# Patient Record
Sex: Male | Born: 1945 | Race: White | Hispanic: No | Marital: Married | Smoking: Never smoker
Health system: Southern US, Community
[De-identification: ages and names within clinical notes are randomized; demographics above are authoritative.]

## PROBLEM LIST (undated history)

## (undated) DIAGNOSIS — I700 Atherosclerosis of aorta: Secondary | ICD-10-CM

## (undated) DIAGNOSIS — N281 Cyst of kidney, acquired: Secondary | ICD-10-CM

## (undated) DIAGNOSIS — Z87442 Personal history of urinary calculi: Secondary | ICD-10-CM

## (undated) DIAGNOSIS — K219 Gastro-esophageal reflux disease without esophagitis: Secondary | ICD-10-CM

## (undated) DIAGNOSIS — M1990 Unspecified osteoarthritis, unspecified site: Secondary | ICD-10-CM

## (undated) DIAGNOSIS — Z9861 Coronary angioplasty status: Principal | ICD-10-CM

## (undated) DIAGNOSIS — K5790 Diverticulosis of intestine, part unspecified, without perforation or abscess without bleeding: Secondary | ICD-10-CM

## (undated) DIAGNOSIS — Z955 Presence of coronary angioplasty implant and graft: Secondary | ICD-10-CM

## (undated) DIAGNOSIS — E7800 Pure hypercholesterolemia, unspecified: Secondary | ICD-10-CM

## (undated) DIAGNOSIS — R911 Solitary pulmonary nodule: Secondary | ICD-10-CM

## (undated) DIAGNOSIS — I10 Essential (primary) hypertension: Secondary | ICD-10-CM

## (undated) DIAGNOSIS — I2510 Atherosclerotic heart disease of native coronary artery without angina pectoris: Secondary | ICD-10-CM

## (undated) DIAGNOSIS — Z8679 Personal history of other diseases of the circulatory system: Secondary | ICD-10-CM

## (undated) DIAGNOSIS — Z974 Presence of external hearing-aid: Secondary | ICD-10-CM

## (undated) DIAGNOSIS — C61 Malignant neoplasm of prostate: Secondary | ICD-10-CM

## (undated) DIAGNOSIS — I8390 Asymptomatic varicose veins of unspecified lower extremity: Secondary | ICD-10-CM

## (undated) DIAGNOSIS — Z85828 Personal history of other malignant neoplasm of skin: Secondary | ICD-10-CM

## (undated) DIAGNOSIS — Z85820 Personal history of malignant melanoma of skin: Secondary | ICD-10-CM

## (undated) DIAGNOSIS — Z973 Presence of spectacles and contact lenses: Secondary | ICD-10-CM

## (undated) DIAGNOSIS — Z9889 Other specified postprocedural states: Secondary | ICD-10-CM

## (undated) HISTORY — PX: LUMBAR SPINE SURGERY: SHX701

## (undated) HISTORY — DX: Gastro-esophageal reflux disease without esophagitis: K21.9

## (undated) HISTORY — DX: Coronary angioplasty status: Z98.61

## (undated) HISTORY — PX: CERVICAL FUSION: SHX112

## (undated) HISTORY — PX: TRIGGER FINGER RELEASE: SHX641

## (undated) HISTORY — DX: Diverticulosis of intestine, part unspecified, without perforation or abscess without bleeding: K57.90

## (undated) HISTORY — DX: Pure hypercholesterolemia, unspecified: E78.00

## (undated) HISTORY — PX: BRACHIAL NERVE REPAIR: SHX1260

## (undated) HISTORY — DX: Asymptomatic varicose veins of unspecified lower extremity: I83.90

## (undated) HISTORY — PX: POSTERIOR LUMBAR FUSION: SHX6036

## (undated) HISTORY — DX: Essential (primary) hypertension: I10

## (undated) HISTORY — PX: CORONARY ANGIOPLASTY: SHX604

## (undated) HISTORY — PX: BUNIONECTOMY: SHX129

## (undated) HISTORY — DX: Atherosclerotic heart disease of native coronary artery without angina pectoris: I25.10

## (undated) HISTORY — DX: Solitary pulmonary nodule: R91.1

---

## 1993-04-15 HISTORY — PX: OTHER SURGICAL HISTORY: SHX169

## 1997-09-14 ENCOUNTER — Ambulatory Visit (HOSPITAL_COMMUNITY): Admission: RE | Admit: 1997-09-14 | Discharge: 1997-09-14 | Payer: Self-pay | Admitting: Orthopedic Surgery

## 1997-12-22 ENCOUNTER — Encounter: Payer: Self-pay | Admitting: Orthopedic Surgery

## 1997-12-22 ENCOUNTER — Ambulatory Visit (HOSPITAL_COMMUNITY): Admission: RE | Admit: 1997-12-22 | Discharge: 1997-12-24 | Payer: Self-pay | Admitting: Orthopedic Surgery

## 1998-01-06 ENCOUNTER — Encounter: Payer: Self-pay | Admitting: Orthopedic Surgery

## 1998-01-06 ENCOUNTER — Ambulatory Visit (HOSPITAL_COMMUNITY): Admission: RE | Admit: 1998-01-06 | Discharge: 1998-01-06 | Payer: Self-pay | Admitting: Orthopedic Surgery

## 1998-01-19 ENCOUNTER — Ambulatory Visit (HOSPITAL_COMMUNITY): Admission: RE | Admit: 1998-01-19 | Discharge: 1998-01-19 | Payer: Self-pay | Admitting: Orthopedic Surgery

## 1999-08-13 ENCOUNTER — Ambulatory Visit (HOSPITAL_COMMUNITY): Admission: RE | Admit: 1999-08-13 | Discharge: 1999-08-14 | Payer: Self-pay | Admitting: *Deleted

## 1999-08-13 ENCOUNTER — Encounter: Payer: Self-pay | Admitting: *Deleted

## 1999-08-14 ENCOUNTER — Encounter: Payer: Self-pay | Admitting: *Deleted

## 1999-10-26 ENCOUNTER — Ambulatory Visit (HOSPITAL_COMMUNITY): Admission: RE | Admit: 1999-10-26 | Discharge: 1999-10-26 | Payer: Self-pay | Admitting: *Deleted

## 1999-10-26 ENCOUNTER — Encounter: Payer: Self-pay | Admitting: *Deleted

## 1999-12-13 ENCOUNTER — Ambulatory Visit (HOSPITAL_COMMUNITY): Admission: RE | Admit: 1999-12-13 | Discharge: 1999-12-13 | Payer: Self-pay | Admitting: *Deleted

## 1999-12-13 ENCOUNTER — Encounter: Payer: Self-pay | Admitting: *Deleted

## 2001-06-26 ENCOUNTER — Encounter: Payer: Self-pay | Admitting: Geriatric Medicine

## 2001-06-26 ENCOUNTER — Encounter: Admission: RE | Admit: 2001-06-26 | Discharge: 2001-06-26 | Payer: Self-pay | Admitting: Geriatric Medicine

## 2002-04-15 DIAGNOSIS — Z9861 Coronary angioplasty status: Secondary | ICD-10-CM

## 2002-04-15 DIAGNOSIS — I2510 Atherosclerotic heart disease of native coronary artery without angina pectoris: Secondary | ICD-10-CM

## 2002-04-15 HISTORY — DX: Atherosclerotic heart disease of native coronary artery without angina pectoris: I25.10

## 2002-04-15 HISTORY — DX: Atherosclerotic heart disease of native coronary artery without angina pectoris: Z98.61

## 2002-05-17 ENCOUNTER — Ambulatory Visit (HOSPITAL_COMMUNITY): Admission: RE | Admit: 2002-05-17 | Discharge: 2002-05-17 | Payer: Self-pay | Admitting: Gastroenterology

## 2002-06-28 ENCOUNTER — Ambulatory Visit (HOSPITAL_COMMUNITY): Admission: RE | Admit: 2002-06-28 | Discharge: 2002-06-29 | Payer: Self-pay | Admitting: Cardiology

## 2002-06-28 DIAGNOSIS — Z955 Presence of coronary angioplasty implant and graft: Secondary | ICD-10-CM

## 2002-06-28 HISTORY — DX: Presence of coronary angioplasty implant and graft: Z95.5

## 2003-04-14 ENCOUNTER — Ambulatory Visit (HOSPITAL_COMMUNITY): Admission: RE | Admit: 2003-04-14 | Discharge: 2003-04-14 | Payer: Self-pay | Admitting: Geriatric Medicine

## 2003-09-30 ENCOUNTER — Encounter: Admission: RE | Admit: 2003-09-30 | Discharge: 2003-09-30 | Payer: Self-pay | Admitting: Neurosurgery

## 2003-10-28 ENCOUNTER — Inpatient Hospital Stay (HOSPITAL_COMMUNITY): Admission: RE | Admit: 2003-10-28 | Discharge: 2003-11-01 | Payer: Self-pay | Admitting: Neurosurgery

## 2005-01-28 ENCOUNTER — Ambulatory Visit (HOSPITAL_COMMUNITY): Admission: RE | Admit: 2005-01-28 | Discharge: 2005-01-28 | Payer: Self-pay | Admitting: Neurosurgery

## 2005-08-14 ENCOUNTER — Encounter: Payer: Self-pay | Admitting: Orthopedic Surgery

## 2005-09-26 ENCOUNTER — Ambulatory Visit (HOSPITAL_BASED_OUTPATIENT_CLINIC_OR_DEPARTMENT_OTHER): Admission: RE | Admit: 2005-09-26 | Discharge: 2005-09-26 | Payer: Self-pay | Admitting: Orthopedic Surgery

## 2008-04-13 ENCOUNTER — Ambulatory Visit (HOSPITAL_COMMUNITY): Admission: RE | Admit: 2008-04-13 | Discharge: 2008-04-13 | Payer: Self-pay | Admitting: Gastroenterology

## 2008-04-13 ENCOUNTER — Encounter (INDEPENDENT_AMBULATORY_CARE_PROVIDER_SITE_OTHER): Payer: Self-pay | Admitting: Gastroenterology

## 2009-04-06 ENCOUNTER — Encounter: Admission: RE | Admit: 2009-04-06 | Discharge: 2009-04-06 | Payer: Self-pay | Admitting: Neurosurgery

## 2009-07-21 ENCOUNTER — Inpatient Hospital Stay (HOSPITAL_COMMUNITY): Admission: RE | Admit: 2009-07-21 | Discharge: 2009-07-23 | Payer: Self-pay | Admitting: Neurosurgery

## 2010-05-06 ENCOUNTER — Encounter: Payer: Self-pay | Admitting: Geriatric Medicine

## 2010-07-04 LAB — COMPREHENSIVE METABOLIC PANEL
ALT: 23 U/L (ref 0–53)
AST: 26 U/L (ref 0–37)
Albumin: 4.2 g/dL (ref 3.5–5.2)
Alkaline Phosphatase: 55 U/L (ref 39–117)
BUN: 14 mg/dL (ref 6–23)
CO2: 27 mEq/L (ref 19–32)
Calcium: 9.7 mg/dL (ref 8.4–10.5)
Chloride: 108 mEq/L (ref 96–112)
Creatinine, Ser: 0.89 mg/dL (ref 0.4–1.5)
GFR calc Af Amer: >60 mL/min (ref >60)
GFR calc non Af Amer: >60 mL/min (ref >60)
Glucose, Bld: 122 mg/dL — ABNORMAL HIGH (ref 70–99)
Potassium: 4.7 mEq/L (ref 3.5–5.1)
Sodium: 140 mEq/L (ref 135–145)
Total Bilirubin: 0.8 mg/dL (ref 0.3–1.2)
Total Protein: 6.3 g/dL (ref 6.0–8.3)

## 2010-07-04 LAB — DIFFERENTIAL
Basophils Absolute: 0 10*3/uL (ref 0.0–0.1)
Basophils Relative: 1 % (ref 0–1)
Eosinophils Absolute: 0.1 10*3/uL (ref 0.0–0.7)
Eosinophils Relative: 2 % (ref 0–5)
Lymphocytes Relative: 32 % (ref 12–46)
Lymphs Abs: 1.8 10*3/uL (ref 0.7–4.0)
Monocytes Absolute: 0.4 10*3/uL (ref 0.1–1.0)
Monocytes Relative: 8 % (ref 3–12)
Neutro Abs: 3.3 10*3/uL (ref 1.7–7.7)
Neutrophils Relative %: 58 % (ref 43–77)

## 2010-07-04 LAB — CBC
HCT: 48 % (ref 39.0–52.0)
Hemoglobin: 16.3 g/dL (ref 13.0–17.0)
MCHC: 34 g/dL (ref 30.0–36.0)
MCV: 89.9 fL (ref 78.0–100.0)
Platelets: 150 10*3/uL (ref 150–400)
RBC: 5.34 MIL/uL (ref 4.22–5.81)
RDW: 12.9 % (ref 11.5–15.5)
WBC: 5.7 10*3/uL (ref 4.0–10.5)

## 2010-07-04 LAB — URINALYSIS, ROUTINE W REFLEX MICROSCOPIC
Bilirubin Urine: NEGATIVE
Glucose, UA: NEGATIVE mg/dL
Hgb urine dipstick: NEGATIVE
Ketones, ur: NEGATIVE mg/dL
Nitrite: NEGATIVE
Protein, ur: NEGATIVE mg/dL
Specific Gravity, Urine: 1.012 (ref 1.005–1.030)
Urobilinogen, UA: 1 mg/dL (ref 0.0–1.0)
pH: 7.5 (ref 5.0–8.0)

## 2010-07-04 LAB — PROTIME-INR
INR: 1.09 (ref 0.00–1.49)
Prothrombin Time: 14 seconds (ref 11.6–15.2)

## 2010-07-04 LAB — SURGICAL PCR SCREEN
MRSA, PCR: NEGATIVE
Staphylococcus aureus: POSITIVE — AB

## 2010-07-04 LAB — APTT: aPTT: 32 seconds (ref 24–37)

## 2010-07-23 ENCOUNTER — Other Ambulatory Visit: Payer: Self-pay | Admitting: Neurosurgery

## 2010-07-23 DIAGNOSIS — M5481 Occipital neuralgia: Secondary | ICD-10-CM

## 2010-07-27 ENCOUNTER — Other Ambulatory Visit: Payer: Self-pay | Admitting: Neurosurgery

## 2010-07-27 DIAGNOSIS — M531 Cervicobrachial syndrome: Secondary | ICD-10-CM

## 2010-07-30 ENCOUNTER — Other Ambulatory Visit: Payer: Self-pay

## 2010-07-30 ENCOUNTER — Other Ambulatory Visit: Payer: Self-pay | Admitting: Neurosurgery

## 2010-07-30 ENCOUNTER — Ambulatory Visit
Admission: RE | Admit: 2010-07-30 | Discharge: 2010-07-30 | Disposition: A | Discharge status: Home / Self Care | Payer: 59 | Source: Ambulatory Visit | Attending: Neurosurgery | Admitting: Neurosurgery

## 2010-07-30 DIAGNOSIS — M5481 Occipital neuralgia: Secondary | ICD-10-CM

## 2010-07-30 DIAGNOSIS — M531 Cervicobrachial syndrome: Secondary | ICD-10-CM

## 2010-08-15 ENCOUNTER — Other Ambulatory Visit: Payer: Self-pay | Admitting: Dermatology

## 2010-08-28 NOTE — Op Note (Signed)
NAME:  Ian [name], Ian [name]                  ACCOUNT NO.:  [id number]   MEDICAL RECORD NO.:  [id number]          PATIENT TYPE:  AMB   LOCATION:  ENDO                         FACILITY:  [facility]   PHYSICIAN:  Danise Edge, M.D.   DATE OF BIRTH:  [birth year]   DATE OF PROCEDURE:  04/13/2008  DATE OF DISCHARGE:                               OPERATIVE REPORT   PROCEDURE INDICATIONS:  [name] we is a 65 year old male born,  06/19/1945.  [name] describes undergoing an esophageal dilation  in the past.  In 2004, his esophagogastroduodenoscopy was normal.  He  has chronic gastroesophageal reflux, which is well controlled on his  proton pump inhibitor therapy.  He has developed intermittent solid food  dysphagia with chest pain.   MEDICATION ALLERGIES:  None.  He has gastrointestinal side effects when  he takes PENICILLIN, CODEINE, MORPHINE, and DEMEROL.   CHRONIC MEDICATIONS:  Aspirin, Zetia, Lescol, Protonix, lisinopril,  Viagra, multivitamin, Glucosamine, Coenzyme Q10, Omega-3 fatty acid.   PAST MEDICAL AND SURGICAL HISTORY:  1. Coronary artery disease.  2. Coronary artery stent placement.  3. Hypertension.  4. Gastroesophageal reflux.  5. Headaches.  6. Dyslipidemia.  7. Cervical spine surgery.  8. Vasectomy.   ENDOSCOPIST:  Danise Edge, MD   PREMEDICATION:  Fentanyl 75 mcg and Versed 8 mg.   PROCEDURE:  After obtaining informed consent, [name] was placed in the  left lateral decubitus position on the fluoroscopy table.  I  administered into intravenous fentanyl and intravenous Versed to achieve  conscious sedation for the procedure.  The patient's blood pressure,  oxygen saturation, and cardiac rhythm were monitored throughout the  procedure and documented in the medical record.   The Pentax gastroscope was passed through the posterior hypopharynx into  the proximal esophagus without difficulty.  The hypopharynx, larynx, and  vocal cords appear normal.   Esophagoscopy:  The proximal mid and lower segments of the esophageal  mucosa appear completely normal.  There is no endoscopic evidence for  the presence of erosive esophagitis, Barrett esophagus, esophageal  stricture formation, or esophageal obstruction.  The squamocolumnar  junction and esophagogastric junction are noted at 40 cm from the  incisor teeth.   Gastroscopy:  Retroflex view of the gastric cardia and fundus reveals  several 5-mm sized gastric polyps, which were biopsies.  The gastric  body, antrum, and pylorus appeared normal.   Duodenoscopy:  The duodenal bulb and descending duodenum appeared  normal.   Savary esophageal dilation:  The Savary dilator wire was passed through  the gastroscope and the tip of the guidewire advanced through the distal  gastric antrum.  Fluoroscopy was not required for esophageal dilation.  The 15-mm Savary dilator passed without resistance.  Repeat  esophagogastrostomy revealed an intact esophageal mucosa without  indication of esophageal stricture dilation.  The gastric mucosa  appeared normal without signs of trauma due to the wire.   Esophageal biopsies:  Biopsies were taken along the length of the  esophagus to look for eosinophilic esophagitis.   ASSESSMENT:  Normal esophagogastroduodenoscopy except for the presence  of several small benign appearing polyps in the gastric cardia and  fundus, which were biopsied.  Esophageal biopsies are pending to rule  out eosinophilic esophagitis.  No endoscopic evidence for the presence  of Barrett esophagus, erosive esophagitis, or esophageal stricture  formation as a result of his chronic gastroesophageal reflux.           ______________________________  Danise Edge, M.D.     MJ/MEDQ  D:  04/13/2008  T:  04/13/2008  Job:  [id number]   cc:   Hal T. Stoneking, M.D.

## 2010-08-31 NOTE — Discharge Summary (Signed)
[place]. [facility]  Patient:    Ian [name], Ian [name]                         MRN: [id number] Adm. Date:  [id number] Disc. Date: [id number] Attending:  Evonnie Dawes CC:         Dr. Renae Fickle             Dr. Luiz Blare             Dr. Meryl Crutch                           Discharge Summary  ADMISSION DIAGNOSES:  1. Status post remote right C2 ganglionectomy which was successful in 1993     for control of headache.  2. Status post remote L4-5 right laminectomy and L3-4 laminectomy for     herniated disk.  3. Recent MRI confirmed L4-5 herniated nucleus pulposus midline and to the     right with alternating right and left leg pain and spinal stenosis, not     responsive to conservative treatment.  OPERATIONS/PROCEDURES:  1. Bilateral L4-5 microsurgical decompression with complicated lysis of     epidural adhesions on the right.  2. Harvesting of allograft for fusion.  3. Right-sided L3 through L5 intertransverse body fusion.  The surgery was performed by Dr. Gasper Sells, assisted by Dr. Luiz Blare.  Anesthesia was general, controlled.  Estimated blood loss was 800 cc.  Operative report number [place] [id number].  COMPLICATIONS: None.  HISTORY OF PRESENT ILLNESS: This patient is a 65 year old male who I am familiar with because I did a right C2 ganglionectomy on him in 1993 that was successful, and his headaches were very much better and do not present a clinical problem right now.  What did happen with him is that I saw him again in late March of 2001 and basically he re-ruptured the L4-5 level that Dr. Renae Fickle had operated on in 1998.  Subsequent to that he had ruptured the L3-4 disk as well and had quite a bit of residual numbness and tingling in the right side, weakness in the quadriceps, and also some dorsiflexion weakness on the right side.  This was identical to what he had preoperatively.  His major pain was in his left leg preoperatively, however.  HOSPITAL COURSE: The L4-5  level was approached in an x-ray controlled fashion and attempt was made to get into the disk space at L4-5 from the right side. There were massive adhesions and the dura on that side was inadequate; however, the arachnoid had not been perforated and there was no CSF leak.  It was elected to remove the disk as radically as possible from the left side and then reevaluate the right side, and this was carried out.  The L5 nerve root on the right side was then palpated with a blunt nerve hook and found to be completely free.  Postoperatively I kept him flat, having done a Surgicel and Tisseal closure of the lacerated scar tissue.  The morning following surgery he had no headache and the incision was flat. He was ambulated and was ambulating normally in the hallway.  His left leg pain was completely gone and his right leg pain was significantly better.  The weakness that he had in his leg preoperatively was essentially unchanged and probably related to remote surgery anyway.  His incision looked clear.  His vital signs were stable.  He  was tolerating oral feeds, and his bladder was functioning normally, so he was discharged home.  DISCHARGE MEDICATIONS:  1. He had Vicodin at home to take on a p.r.n. basis for pain and he was told     he could only have that for three weeks.  2. Septra DS 1 p.o. b.i.d.  DISCHARGE CONDITION: Markedly improved.  DISPOSITION: Home.  FOLLOW-UP: To follow up in eight days in my office. DD:  08/14/99 TD:  08/16/99 Job: [id number]

## 2010-08-31 NOTE — Op Note (Signed)
[place]. [facility]  Patient:    Ian [name], Ian [name]                         MRN: [id number] Proc. Date: 08/13/99 Adm. Date:  [id number] Disc. Date: [id number] Attending:  Evonnie Dawes Dictator:   Donnal Debar. Gasper Sells, M.D. CC:         Jearld Adjutant, M.D.                           Operative Report  PREOPERATIVE DIAGNOSIS:  Herniated nucleus pulposus with spinal stenosis, L4-5, center line and eccentric to the right, with a history of previous laminectomies at L3-4 and L4-5.  OPERATIONS: 1. Bilateral L4-5 microsurgical diskectomies. 2. Harvesting posterior elements for autograft. 3. Right L3 through L5 intertransverse body fusion.  SURGEON:  Dr. Gasper Sells.  ASSISTANT:  Dr. Luiz Blare.  COMPLICATIONS:  None.  ESTIMATED BLOOD LOSS:  Around 800 cc.  HISTORY OF PRESENT ILLNESS:  This is a 65 year old gentleman I have been following in the office, having done a previous successful C2 ganglionectomy on him in 1993.  He had a previous L4-5 laminectomy done by Dr. Renae Fickle, and then an L3-4 laminectomy.  He had recurrence of his pain with initially left leg pain, and since then it has alternated from left to right.  A recent MRI showed a large herniated nucleus pulposus, center line to the right at L4-5. This is in addition to congenitally short pedicles and spinal stenosis.  Based on this MRI and his clinical findings and indolent course it was decided that he would require surgery.  DESCRIPTION OF PROCEDURE:  An attempt is made to get into the L4-5 disc from the right side, but there was too much scar tissue.  A careful lysis is done of the L5 nerve root, but it basically was covered only with arachnoid.  There was no CSF leak.  Attention was then directed to the left side, where radical L4-5 diskectomy was performed extending as far as we could over to the right side and subligamentously superiorly.  The disc was irrigated with Betadine solution at that point,  and the L5 nerve root on the left was completely freed.  Attention was then redirected to the right side.  Palpation with a blunt nerve hook was then carried out.  The L5 nerve root on that side was somewhat stuck down, but was free dorsally.  ______ was then used to glue Surgicel over the arachnoid covered L5 nerve root on the right.  There was no dura really to close.  It was all scar tissue.  The posterior elements had been harvested for bone graft and cleaned up. These were then packed over the roughened up lateral aspect of L3, L4, and L5 and the transverse process of L3, L4, and L5 on the right.  DBX bone gel was then placed over top of that, and Surgicel was placed on top of that.  A 2 minute phentanyl epidural was performed with 100 mcg of phentanyl, and then the wound was irrigated with Betadine solution and hydrogen peroxide. Self-retaining retractors removed, and then the dorsolumbar fascia was closed with 0 Vicryl in a running fashion.  The subcutaneous closure was carried out with 2-0 Vicryl inverted interrupted.  Skin staples were used in the skin. Betadine ointment and dry dressing was applied.  The patient tolerated the procedure well. DD:  08/13/99 TD:  08/15/99  Job: [id number]

## 2010-08-31 NOTE — Discharge Summary (Signed)
NAME:  Ian [name], Ian [name]                            ACCOUNT NO.:  [id number]   MEDICAL RECORD NO.:  [id number]                   PATIENT TYPE:  INP   LOCATION:  3011                                 FACILITY:  [facility]   PHYSICIAN:  Payton Doughty, M.D.                   DATE OF BIRTH:  [birth year]   DATE OF ADMISSION:  10/28/2003  DATE OF DISCHARGE:  11/01/2003                                 DISCHARGE SUMMARY   ADMISSION DIAGNOSIS:  Spondylosis, L3-4, L4-5, and S1.   DISCHARGE DIAGNOSIS:  Spondylosis, L3-4, L4-5, and S1.   OPERATIVE PROCEDURES:  1. L3-4, L4-5, and L5-S1 laminectomy and diskectomy.  2. Posterior interbody fusion.  3. Ray threaded fusion cages.  4. Posterolateral arthrodesis.  5. Segmental fixation from L3 to S1.   COMPLICATIONS:  None.   DISCHARGE STATUS:  Alive and well.   HISTORY OF PRESENT ILLNESS:  A 65 year old gentleman whose history and  physical is recounted on the chart.  He has basically had several back  operations which have not helped him any.  He has intractable back pain and  pain down his left leg.  He is admitted for fusion.   PAST MEDICAL HISTORY:  Remarkable for hypertension.   ALLERGIES:  He gets sick with PENICILLIN, OXYCODONE, DEMEROL, and MORPHINE.   MEDICATIONS:  Lisinopril, hydrochlorothiazide, ergotamine, Nexium,  Pravachol, Zetia, and doxycycline.   PHYSICAL EXAMINATION:  General exam is intact.  Neurologic exam demonstrated  a right L5 radiculopathy.   HOSPITAL COURSE:  He was admitted after ascertainment of normal laboratory  values and underwent the fusion as above.  He had quite a lot of compression  of the right L5 root by bone graft that had apparently been in the canal.  Postoperatively he has done extremely well.  He has had a little bit of a  pressure sore on his chin, which is healing.  He was up eating and voiding  by the second postoperative day.  The PCA was stopped the third  postoperative day.   DISPOSITION:  Today he is  being discharged home in the care of his family.   DISCHARGE MEDICATIONS:  He takes Vicoprofen for pain.  He is also going to  go home on ciprofloxacin.   FOLLOWUP:  His followup will be in the [facility] office  in a week for suture removal.                                                Payton Doughty, M.D.    MWR/MEDQ  D:  11/01/2003  T:  11/01/2003  Job:  [id number]

## 2010-08-31 NOTE — Op Note (Signed)
NAME:  SHAWNTA, Ian [name]                            ACCOUNT NO.:  [id number]   MEDICAL RECORD NO.:  [id number]                   PATIENT TYPE:  INP   LOCATION:  3011                                 FACILITY:  [facility]   PHYSICIAN:  Payton Doughty, M.D.                   DATE OF BIRTH:  08-24-1945   DATE OF PROCEDURE:  10/28/2003  DATE OF DISCHARGE:                                 OPERATIVE REPORT   PREOPERATIVE DIAGNOSIS:  Spondylosis at L3-4, L4-5 and L5-S1.   POSTOPERATIVE DIAGNOSIS:  Spondylosis at L3-4, L4-5 and L5-S1.   OPERATIVE PROCEDURE:  L3-4, L4-5 and L5-S1 laminectomies, diskectomies and  posterior lumbar interbody fusion with Ray threaded fusion cages, segmental  pedicle screws from L3 to S1 and posterolateral arthrodesis from L3 to S1.   SURGEON:  Payton Doughty, M.D.   NURSE ASSISTANT:  [place].   DOCTOR ASSISTANT:  Danae Orleans. Venetia Maxon, M.D.   SERVICE:  Neurosurgery.   ANESTHESIA:  General endotracheal.   PREPARATION:  Prepped with sterile Betadine prep and scrubbed with alcohol  wipe.   COMPLICATIONS:  None.   BODY OF TEXT:  [age]-year-old gentleman with severe lumbar spondylosis.  He has had [name] prior diskectomy at L4-5.  He has had an attempt at fusion by  another neurosurgeon at L4-5 and he has positive diskography at 3-4 and 4-5.  He was taken to the operating room and smoothly anesthetized, intubated and  placed prone on the operating table.  Following shave, he was prepped and  draped in the usual sterile fashion.  Skin was infiltrated with 1% lidocaine  with 1:400,000 epinephrine.  Skin was incised from mid-L2 to mid-S1 and the  lamina and transverse processes of L3, L4, L5, S1 and the sacral ala were  exposed bilaterally in the subperiosteal plane.  Intraoperative x-ray  confirmed correctness of the level.  Having confirmed correctness of the  level, the pars interarticularis, remaining lamina and inferior facets of  L3, L4 and L5 and the superior facets of L3,  L4, L5 and S1 were removed  bilaterally.  At 3-4, there was severe spondylosis with compression of the  nerve roots as they traversed the lateral recesses and the neural foramina.  At 4-5 on the right, there was bone graft from the prior fusion that was  inside the canal and growing, significantly compressing the 5 root as it  traversed this area.  It was not possible to get out the disk in this area  because of dense bony overgrowth and the position of the nerve root,  however, the nerve root was completely decompressed.  On the left side,  approach to the nerve root to the disk was feasible and accomplished without  difficulty.  At 5-1, there was spina bifida on the right side.  Following  complete decompression and diskectomy at 3-4 and at 5-1 and on the left  side  at 4-5, Ray threaded fusion cages were placed, 14 x 21 at L5-S1, [name] 12  x 21 cage placed on the left at 4-5 and two 14 x 21 mm at 3-4.  Pedicle  screws were then placed, the cages were packed with bone graft harvested  from the facet joints, the pedicle screws were placed at 3, 4, 5, and 1 and  attached to the rods and locking caps were applied.  Intertransverse bone  was placed over the transverse processes and sacral ala after decortication.  Intraoperative x-ray showed good placement of Ray cages, pedicle screws and  rods.  DVX and morcelized allograft were mixed over the transverse  processes.  The fascia was reapproximated with 0 Vicryl in interrupted  fashion, subcutaneous tissue was reapproximated with 0 Vicryl in interrupted  fashion, subcuticular tissue was reapproximated with 3-0 Vicryl in  interrupted fashion and skin was closed with 3-0 nylon in [name] running-locked  fashion.  Betadine and Telfa dressing were applied and made occlusive with  OpSite and the patient returned to the recovery room in good condition.                                               Payton Doughty, M.D.    MWR/MEDQ  D:  10/28/2003  T:   10/29/2003  Job:  [id number]

## 2010-08-31 NOTE — Op Note (Signed)
NAME:  Ian [name], Ian [name]                  ACCOUNT NO.:  [id number]   MEDICAL RECORD NO.:  [id number]          PATIENT TYPE:  AMB   LOCATION:  DSC                          FACILITY:  [facility]   PHYSICIAN:  Katy Fitch. Sypher, M.D. 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   OPERATION:  1.  Release of right ring finger A1 pulley.  2.  Injection of left ring finger A1 pulley and flexor sheath with Depo-      Medrol and lidocaine 2%.   OPERATING SURGEON:  Josephine Igo, M.D.   ASSISTANT:  Molly Maduro Dasnoit PA-C   ANESTHESIA:  2% lidocaine metacarpal head level block of right ring finger  supplemented by IV sedation, supervising anesthesiologist is Dr. Gelene Mink.   INDICATIONS:  Keyshawn Hellwig is brought to the operating room and placed in  supine position on the operating table.   Following light sedation, the right and left arms were prepped, the right  arm with Betadine soap solution, sterilely draped and the left palm with  alcohol.   The left ring finger flexor sheath was injected with a 1 mL mixture of 0.25  mL of Depo-Medrol 80 mg per mL and 0.75 mL of 2% plain lidocaine.   The finger was placed in flexion, the needle inserted at a 30 degrees angle  through the A1 pulley and the flexor tendons brought distally by finger  extension.  Half of the injection was deposited within the flexor sheath  distending the sheath and half on the exterior surface of the A1 pulley.  The needle was withdrawn and a Band-Aid applied.  There no apparent  complications.  Attention then directed to the right arm which  was  exsanguinated by elevation and compression.  A pneumatic tourniquet was  applied to the proximal brachium at 240 mmHg.  The procedure commenced with  short oblique incision just distal to the distal palmar crease overlying the  thickened A1 pulley.  The subcutaneous tissues were carefully divided  releasing the pre tendinous fibers of the palmar fascia.   The A1 pulley was isolated and noted to be thickened.  The pulley was  released along its radial border with scalpel and scissors.  The tendons  delivered and found be otherwise normal except for fibrotic tenosynovium  proximally.  The tenosynovium was split longitudinally.   Full passive range of motion of fingers recovered.   No apparent complications.   The wound was irrigated and repaired with mattress suture of 5-0 nylon.   A compressive dressings applied with Xeroflo, sterile gauze and Ace wrap.  There no apparent complications.      Katy Fitch Sypher, M.D.  Electronically Signed     RVS/MEDQ  D:  09/26/2005  T:  09/26/2005  Job:  [id number]   cc:   Hal T. Stoneking, M.D.  Fax: [phone]

## 2010-08-31 NOTE — Op Note (Signed)
NAME:  Ian [name], Ian [name]                            ACCOUNT NO.:  [id number]   MEDICAL RECORD NO.:  [id number]                   PATIENT TYPE:  AMB   LOCATION:  ENDO                                 FACILITY[facility]  [facility]   PHYSICIAN:  Danise Edge, M.D.                DATE OF BIRTH:  [birth year]   DATE OF PROCEDURE:  05/17/2002  DATE OF DISCHARGE:                                 OPERATIVE REPORT   PROCEDURES:  Esophagogastroduodenoscopy and colonoscopy.   PROCEDURE INDICATION:  Ian [name] is a 65 year old male, born [birth year].  Ian [name] is undergoing diagnostic esophagogastroduodenoscopy and  colonoscopy to evaluate guaiac-positive stool.   ENDOSCOPIST:  Danise Edge, M.D.   PREMEDICATION:  1. Fentanyl 75 mcg.  2. Versed 10 mg.   ENDOSCOPE:  Olympus gastroscope and adult colonoscope.   FIRST PROCEDURE:  Esophagogastroduodenoscopy.   DESCRIPTION OF PROCEDURE:  After obtaining informed consent, Ian [name] was  placed in the left lateral decubitus position.  I administered intravenous  fentanyl and intravenous Versed to achieve conscious sedation for the  procedure.  The patient's blood pressure, oxygen saturation, and cardiac  rhythm were monitored throughout the procedure and documented in the medical  record.   The Olympus gastroscope was passed through the posterior hypopharynx into  the proximal esophagus without difficulty.  The hypopharynx, larynx, and  vocal cords appeared normal.   ESOPHAGOSCOPY:  The proximal, mid, and lower segments of the esophagus  appear normal.   GASTROSCOPY:  Retroflexed view of the gastric cardia and fundus was normal.  The gastric body, antrum, and pylorus appeared normal.   DUODENOSCOPY:  The duodenal bulb and descending duodenum appeared normal.   ASSESSMENT:  1. Normal esophagogastroduodenoscopy.  2. No signs of the presence of upper gastrointestinal bleeding.   SECOND PROCEDURE:  Colonoscopy.   DESCRIPTION OF PROCEDURE:  Anal  inspection was normal.  Digital rectal exam  was normal.  The Olympus adult colonoscope was introduced into the rectum  and advanced to the cecum.  There was a large amount of residual solid fecal  matter in the right colon, left colon, and rectum which could completely  evacuate by water lavage.  This made a completely accurate colonoscopy  somewhat difficult.   RECTUM:  Normal.  SIGMOID COLON AND DESCENDING COLON:  Normal.  SPLENIC FLEXURE:  Normal.  TRANSVERSE COLON:  Normal.  HEPATIC FLEXURE:  Normal.  ASCENDING COLON:  Normal.  CECUM AND ILEOCECAL VALVE:  Normal.   ASSESSMENT:  1. Normal diagnostic proctocolonoscopy to the cecum.  2. Colonic preparation was marginal in the right colon, left colon, and     rectum.   RECOMMENDATIONS:  If Ian [name] continues to have Hemoccult positive stool, I  would recommend either an air-contrast barium enema, repeat colonoscopy with  adequate prep, or when available, virtual colonoscopy.  Danise Edge, M.D.    MJ/MEDQ  D:  05/17/2002  T:  05/17/2002  Job:  [id number]   cc:   Hal T. Stoneking, M.D.  [address] [place], [place] [address]  Fax: [phone]

## 2010-08-31 NOTE — Cardiovascular Report (Signed)
NAME:  Ian, [name]                            ACCOUNT NO.:  [id number]   MEDICAL RECORD NO.:  [id number]                   PATIENT TYPE:  OIB   LOCATION:  2899                                 FACILITY:  [facility]   PHYSICIAN:  Francisca December, M.D.               DATE OF BIRTH:  1945/04/25   DATE OF PROCEDURE:  06/28/2002  DATE OF DISCHARGE:                              CARDIAC CATHETERIZATION   PROCEDURE PERFORMED:  1. Percutaneous coronary intervention/drug-eluting stent implantation, first     diagonal branch.  2. Right femoral arteriogram.  3. Percutaneous closure, right femoral artery (Perclose).   INDICATIONS FOR PROCEDURE:  The patient is a 66 year old man with atypical  chest pain and an abnormal Cardiolite.  He underwent coronary angiography  approximately 1 week ago revealing a subtotal stenosis and a moderate-sized  diagonal branch.  No other significant stenoses.  He is brought now to the  catheterization laboratory to provide for catheter-based revascularization.   PROCEDURAL NOTE:  The patient was brought to the cardiac catheterization  laboratory in the fasting state.  The right groin was prepped and draped in  the usual sterile fashion.  Local anesthesia was obtained with the  infiltration of 1% lidocaine.  A 6-French catheter sheath was inserted  percutaneously into the right femoral artery utilizing an anterior approach  over a guiding J wire.  A 6-French #3.5 CLS guiding catheter was advanced to  the ascending aorta where the left coronary os was engaged.  The patient  received 69 mg of Angiomax as a bolus and then a constant infusion of 1.75  mg/kg per hour.  The initial ACT was 318 seconds.  A 0.014-inch Asahi  Prowater intracoronary guidewire was advanced across the lesion in the first  diagonal branch without significant difficulty.  The lesion was primarily  stented using a 2.5 x 12-mm [employer] Taxus drug-eluting  intracoronary stent.  The device was  deployed at a peak pressure of 13  atmospheres for 45 seconds.  Following confirmation of adequate patency in  orthogonal views, both with and without the guidewire in place, the guiding  catheter was removed.  A left femoral arteriogram was performed via the  arterial sheath, in a 45-degree RAO angulation.  It demonstrated the right  femoral artery to be widely patent and about 6 to 7 mm in diameter.  There  was no evidence of atherosclerotic disease.  The sheath entry site was well  above the bifurcation into the profunda femoris and the superficial femoral  arteries.  I proceeded with percutaneous closure using the AT Perclose  device.  There was good hemostasis although I did have to infuse 1%  lidocaine with epinephrine into the groin surrounding the puncture site.  The patient was then transported to the recovery area in stable condition  with an intact distal pulse.   ANGIOGRAPHY:  As mentioned, the lesion treated was  in the mid portion of a  moderate-sized diagonal branch.  Following balloon dilatation and stent  implantation, there was no residual stenosis.  The left femoral arteriogram  results were as noted above.   FINAL IMPRESSION:  1. Atherosclerotic coronary vascular disease, single-vessel.  2. Status post successful percutaneous transluminal coronary angioplasty     drug-eluting stent implantation, first     diagonal branch.  3. Rather typical angina was reproduced with device insertion and balloon     inflation.  The patient complained of pain in both arms and shoulders and     the base of his neck.                                               Francisca December, M.D.    JHE/MEDQ  D:  06/28/2002  T:  06/28/2002  Job:  [id number]   cc:   Hal T. Stoneking, M.D.  [address] [place], [place] [address]  Fax: [phone]   Cardiac Catheterization Lab

## 2010-08-31 NOTE — H&P (Signed)
NAME:  Ian [name], Ian [name]                            ACCOUNT NO.:  [id number]   MEDICAL RECORD NO.:  [id number]                   PATIENT TYPE:  INP   LOCATION:  NA                                   FACILITY:  [facility]   PHYSICIAN:  Payton Doughty, M.D.                   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   HISTORY:  This is a 65 year old right handed white gentleman with who had L4-  5 diskectomy done by Dr. Renae Fickle in 1998 and an intertransverse fusion done by  Dr. Gasper Sells from L3 to L5.  He had complaints of episodic pain in his back  which puts him in the bed as well as pain down his left leg and then also in  his right leg.  He has undergone discography that was strongly positive at  L3-4 and L4-5 and he is now admitted for decompression and fusion.   PAST MEDICAL HISTORY:  Remarkable for hypertension.   MEDICATIONS:  1. Lisinopril.  2. Hydrochlorothiazide 20/12.5 once daily.  3. Ergotamine and _______100 mg b.i.d.  4. Nexium 40 mg daily.  5. Pravachol 20 mg daily.  6. Zetia 10 mg daily.  7. Doxycycline 100 mg b.i.d.   ALLERGIES:  He gets sick with PENICILLIN, OXYCODONE, DEMEROL AND MORPHINE.   OTHER SURGICAL HISTORY:  A C2 ganglionectomy and shoulder arthroscopy by Dr.  Renae Fickle.  He had a stent in his heart in 2004 by Dr. Amil Amen.   SOCIAL HISTORY:  He does not smoke and does not drink.  He is in Air[employer] for  [employer].   FAMILY HISTORY:  Mom is 72 with hypertension.  Dad is alive at 3 and had a  heart attack less than a year ago.   REVIEW OF SYSTEMS:  Remarkable for glasses, hypertension,  hypercholesterolemia and back pain.   PHYSICAL EXAMINATION:  HEENT EXAM:  Within normal limits.  NECK:  He has good range of motion of his neck, particularly given that he  had a ganglionectomy.  CHEST:   Clear.  CARDIAC EXAM:  Regular rate and rhythm.  ABDOMEN:  Nontender, no hepatosplenomegaly.  EXTREMITIES:  Without clubbing, cyanosis.  GU EXAM:  Deferred.  PULSES:  Peripheral pulses are good.  NEUROLOGIC:  He is awake, alert and oriented.  His cranial nerves II-XII  intact.  Motor exam shows 5/5 strength throughout the upper and lower  extremities save for dorsiflexion of the right foot which s 4/5 and he gets  a foot drop after he has been walking for a bit.  Sensory - disesthesia as  described.  L5 and S1 - reflexes are 2 at the knees, absent at the ankles  bilaterally.  Straight leg raises is  positive for right leg pain.   Diskography was positive at 3-4 and 4-5.  I really do not want to set him up  for transitional segments; 5-1will be included in the fusion.  He has a  laminectomy defect of 5 and a partial at 4 it appears, and splayed facet  joints at 3-4 and 4-5.   CLINICAL IMPRESSION:  Lumbar spondylosis with right L5 radiculopathy.   PLAN:  For L3-4, L4-5 and L5-S1 laminectomy, diskectomy, posterior lumbar bi-  fusion with  fusion cages and posterolateral arthrodesis, segmental fixation  with 90 D instrumentation.  The risks and benefits of this approach have  been discussed with him and he wished to proceed.                                                Payton Doughty, M.D.    MWR/MEDQ  D:  10/28/2003  T:  10/28/2003  Job:  [id number]

## 2010-09-14 ENCOUNTER — Other Ambulatory Visit: Payer: Self-pay | Admitting: Neurosurgery

## 2010-09-14 DIAGNOSIS — M5481 Occipital neuralgia: Secondary | ICD-10-CM

## 2010-09-18 ENCOUNTER — Ambulatory Visit
Admission: RE | Admit: 2010-09-18 | Discharge: 2010-09-18 | Disposition: A | Discharge status: Home / Self Care | Payer: 59 | Source: Ambulatory Visit | Attending: Neurosurgery | Admitting: Neurosurgery

## 2010-09-18 DIAGNOSIS — M5481 Occipital neuralgia: Secondary | ICD-10-CM

## 2010-10-01 ENCOUNTER — Other Ambulatory Visit: Payer: Self-pay | Admitting: Dermatology

## 2010-12-19 ENCOUNTER — Other Ambulatory Visit: Payer: Self-pay | Admitting: Dermatology

## 2011-03-12 ENCOUNTER — Other Ambulatory Visit: Payer: Self-pay | Admitting: Gastroenterology

## 2011-03-22 ENCOUNTER — Other Ambulatory Visit: Payer: Self-pay | Admitting: Gastroenterology

## 2011-03-22 ENCOUNTER — Other Ambulatory Visit (HOSPITAL_COMMUNITY): Payer: Self-pay | Admitting: Gastroenterology

## 2011-03-22 ENCOUNTER — Ambulatory Visit
Admission: RE | Admit: 2011-03-22 | Discharge: 2011-03-22 | Disposition: A | Discharge status: Home / Self Care | Payer: 59 | Source: Ambulatory Visit | Attending: Gastroenterology | Admitting: Gastroenterology

## 2011-03-22 DIAGNOSIS — R1033 Periumbilical pain: Secondary | ICD-10-CM

## 2011-03-26 ENCOUNTER — Ambulatory Visit (HOSPITAL_COMMUNITY)
Admission: RE | Admit: 2011-03-26 | Discharge: 2011-03-26 | Disposition: A | Discharge status: Home / Self Care | Payer: 59 | Source: Ambulatory Visit | Attending: Gastroenterology | Admitting: Gastroenterology

## 2011-03-26 DIAGNOSIS — R109 Unspecified abdominal pain: Secondary | ICD-10-CM | POA: Insufficient documentation

## 2011-03-26 DIAGNOSIS — R1033 Periumbilical pain: Secondary | ICD-10-CM

## 2011-04-01 ENCOUNTER — Other Ambulatory Visit: Payer: Self-pay | Admitting: Dermatology

## 2011-11-04 ENCOUNTER — Encounter (HOSPITAL_COMMUNITY): Payer: Self-pay | Admitting: Pharmacy Technician

## 2011-11-05 ENCOUNTER — Other Ambulatory Visit: Payer: Self-pay | Admitting: Cardiology

## 2011-11-05 NOTE — H&P (Signed)
ms/chest pain per Dr Anne Fu.      HPI:     General:           Mr Ian [name] is a 66 year old male followed by Dr Anne Fu with hx of coronary artery disease with previous stent in the first diagonal branch in 2004. Stress test (07/03/10) with mild decreased uptake seen at both rest and stress in the inferior wall distribution and the area of previously placed diagonal stent does not demonstrate any worsening ischemia. Ejection fraction 48%. Patient is seen today after onset intermittent left chest discomfort with slight radiation to jaw, 4/10. He initially thought it was related to GI problems with increase indigestion and gas with belching and flatus. Onset 1 week ago that is unrelated to activity has been occurring in left chest radiated into back yesterday, and today it was in his left chest but not back.He felt Tums did not help and Zantac once last week did. He takes Nexium daily.          No Nausea/vomiting/ no change in bowel habit. He feels more fatigued and states he "just does not feel like himself". Some of these symptoms remind him of when he had his stent in 2004. Denies any SOB, palpitations, PND, or diaphoresis. He does not have NTG..      ROS:      as noted in HPI, no fever, chills congestion, no change in appetite, + multiple back surgeries but pain is controlled and feels able to lay flat without difficulty, no allergy to IVP dye.     Medical History: Hypertension, Coronary artery disease stent 1st diagonal branch 06/28/2002-low risk stress nuclear 06/2010, GERD with stricture 2003, Thyroid cyst, migraine negative MRI brain/neck March 2003, hypercholesterolemia goal LDL less than 70, nephrolithaisis Dr Brunilda Payor 03/2009, u/s abd AAA screen neg 2009 outside testing, Varicose veins, derm Dr [place] and Dr Irene Limbo, cardiology Dr Anne Fu, Ejection fraction 48% 06/2010, Thrombocytopenia 12/2010 132,000 recheck one month repeat 140,010/2012, Father had esophogeal cancer, nephrolithiasis Dr Brunilda Payor, Urine positive for  protein 06/2011 on lisinopril, *lung nodule seen 07/2011 on CT at the base 5 mm followup 6 months.      Surgical History: right side breast cyst removed , cervical spine surgery , lumbar laminectomy, discectomy by Dr. Renae Fickle , lumbar fusion L3-L5 , vasectomy , angioplasty 2004, cervical nerve block , c 2-3 fusion Dr Channing Mutters 07/2009, melanoma scalp Dr Irene Limbo 5 /2018, EGD .      Family History:  Father: deceased 67 yrs Prostate cancer, coronary artery disease, esophogeal cancer Mother: alive 78 yrs Hypertension,dementia Brother 1: alive 50 yrs Prostate cancer, coronary artery disease Sister 1: alive 77 yrs A + W Sister 2: alive 57 yrs breast ca  2 sons both hypertension and 1 had melanoma.     Social History:      General: History of smoking  cigarettes:  Never smoked. no Smoking. no Alcohol. Caffeine: yes. no Recreational drug use. 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 Marital Status: married.     2 grown sons.     Medications: Aspirin 81 MG Tablet Chewable 1 tablet Once a day, Multiple Vitamin Tablet one tablet once a day, Folic Acid 400 MCG Tablet one tablet once a day, Theratears 0.25 % Solution 2 Capsules Once a day, Vitamin C 500 MG Tablet Chewable 2 tablets once a day, Coenzyme Q10 400 MG Capsule one tablet once a day, Vicodin 5-500 MG Tablet 2 tablet as needed for pain as needed,  Viagra 50 MG Tablet TAKE 1 TABLET AS NEEDED as needed, Vitamin D 2000 units Tablet 1 tablet Once a day, Vitamin B-2 100 MG Tablet 1 tablet with a meal Once a day, Lisinopril 20 MG Tablet 1 tablet Once a day, Nexium 40 MG Capsule Delayed Release 1 capsule Once a day, Welchol 625 MG Tablet 1 tablet with meals NOT TAKING, stop date 09/23/2011, Tart Cherry Advanced 200 MG Capsule 2 tablets Once a day, OTC Garcinia Cambogia 600 MG 1 tablet Twice a day, Medication List reviewed and reconciled with the patient     Allergies: Codeine Phosphate: nausea and vomiting, Demerol: nausea and  vomiting, Morphine Sulfate: nausea and vomiting, Pravachol: muscle pain, Penicillin (for allergy): nausea and vomiting, Lescol: muscle pain, Zetia: muscle pain.      Objective:    Vitals: Wt 213.6, Wt change 2.6 lb, Ht 70.5, BMI 30.21, Pulse sitting 80, BP sitting 144/98.     Examination:     Cardiology, General:         GENERAL APPEARANCE: pleasant, NAD.  HEENT: unremarkable.  CAROTID UPSTROKE: normal, no bruit.  JVD: flat.  HEART SOUNDS: regular, normal S1, S2, no S3 or S4.  MURMUR: absent.  LUNGS: no rales or wheezes.  ABDOMEN: soft, non tender, positive bowel sounds, no masses felt.  EXTREMITIES: no leg edema.  PERIPHERAL PULSES: 2 plus bilateral.            Assessment:    Assessment:  1. Chest pain - 786.50 (Primary)   2. Coronary atherosclerosis of native coronary artery - 414.01   3. Essential hypertension, benign - 401.1     Plan:    1. Chest pain  Start Nitroglycerin 0.4 mg tablet, 0.4 mg, 1 tablet as directed, SL, as directed prn chest pain, 30 days, 1 vial, Refills 11 .         LAB: Basic Metabolic     GLUCOSE 87 70-99 - mg/dL        BUN 14 4-09 - mg/dL        CREATININE 8.11 0.60-1.30 - mg/dl        eGFR (NON-AFRICAN AMERICAN) 82 >60 - calc        eGFR (AFRICAN AMERICAN) 99 >60 - calc        SODIUM 140 136-145 - mmol/L        POTASSIUM 4.5 3.5-5.5 - mmol/L        CHLORIDE 103 98-107 - mmol/L         C02 33 22-32 - mg/dL H       ANION GAP 7.8 9.1-47.8 - mmol/L        CALCIUM 9.9 8.6-10.3 - mg/dL               Ian [name] A 11/05/2011 01:28:58 PM > ok for cath        LAB: CBC with Diff     WBC 6.8 4.0-11.0 - K/ul        RBC 5.61 4.20-5.80 - M/uL        HGB 16.3 13.0-17.0 - g/dL        HCT 29.5 62.1-30.8 - %        MCH 29.0 27.0-33.0 - pg        MPV 7.6 7.5-10.7 - fL        MCV 89.7 80.0-94.0 - fL        MCHC 32.4 32.0-36.0 - g/dL        RDW 65.7 84.6-96.2 - %  NRBC# 0.01 -        PLT 154 150-400 - K/uL        NEUT % 61.8 43.3-71.9 - %        NRBC%  0.10 - %        LYMPH% 26.8 16.8-43.5 - %        MONO % 8.2 4.6-12.4 - %        EOS % 2.2 0.0-7.8 - %        BASO % 1.0 0.0-1.0 - %        NEUT # 4.2 1.9-7.2 - K/uL        LYMPH# 1.80 1.10-2.70 - K/uL        MONO # 0.6 0.3-0.8 - K/uL        EOS # 0.2 0.0-0.6 - K/uL        BASO # 0.1 0.0-0.1 - K/uL               Desarea Ohagan A 11/05/2011 01:28:36 PM > ok for cath        LAB: PT and PTT ([id number])     aPTT 28 24-33 - SEC        INR 1.0 0.8-1.2 -        Prothrombin Time 11.2 9.1-12.0 - SEC               Perina Salvaggio A 11/05/2011 01:28:45 PM > ok   Diagnostic Imaging:EKG NSR, Corson,Danielle 11/04/2011 04:18:43 PM > Jahmal Dunavant A 11/04/2011 05:14:27 PM > no significant change compared to previous EKG    Dr Anne Fu also in to see pt, will proceed with cardiac cath by radial approach at main lab later this week. Risks and benefits of cardiac catheterization have been reviewed including risk of stroke, heart attack, death, bleeding, renal impariment and arterial damage. There was ample oppurtuny to answer questions. Alternatives were discussed. Patient understands and wishes to proceed.       2. Coronary atherosclerosis of native coronary artery  Continue Aspirin Tablet Chewable, 81 MG, 1 tablet, Orally, Once a day .       3. Essential hypertension, benign  Continue Lisinopril Tablet, 20 MG, 1 tablet, Orally, Once a day .       4. Others   Continue Welchol Tablet, 625 MG, 1 tablet with meals, Orally, NOT TAKING .          Immunizations:       Labs:      Procedure Codes: 19147 EKG I AND R, 80048 ECL BMP, 85025 ECL CBC PLATELET DIFF, 82956 BLOOD COLLECTION ROUTINE VENIPUNCTURE     Preventive:           Follow Up: MS pending cath (Reason: CAD, s/p cath)        Provider: Michaell Cowing. Emelda Fear, NP  Patient: Ian [name], Ian [name]  DOB: 1945/05/01  Date: 11/04/2011

## 2011-11-06 NOTE — H&P (Signed)
Office Visit     Patient: Ian [name], Ian [name] Provider: Michaell Cowing. Emelda Fear, NP  DOB: 20-Mar-1946   Age: 66 Y   Sex: Male Date: 11/04/2011  Phone: [phone]  Address: [address] [place], [place]  Pcp: HAL STONEKING    --------------------------------------------------------------------------------  Subjective:    CC:      1. ms/chest pain per Dr Anne Fu.      HPI:     General:           Ian [name] is a 66 year old male followed by Dr Anne Fu with hx of coronary artery disease with previous stent in the first diagonal branch in 2004. Stress test (07/03/10) with mild decreased uptake seen at both rest and stress in the inferior wall distribution and the area of previously placed diagonal stent does not demonstrate any worsening ischemia. Ejection fraction 48%. Patient is seen today after onset intermittent left chest discomfort with slight radiation to jaw, 4/10. He initially thought it was related to GI problems with increase indigestion and gas with belching and flatus. Onset 1 week ago that is unrelated to activity has been occurring in left chest radiated into back yesterday, and today it was in his left chest but not back.He felt Tums did not help and Zantac once last week did. He takes Nexium daily.          No Nausea/vomiting/ no change in bowel habit. He feels more fatigued and states he "just does not feel like himself". Some of these symptoms remind him of when he had his stent in 2004. Denies any SOB, palpitations, PND, or diaphoresis. He does not have NTG..      ROS:      as noted in HPI, no fever, chills congestion, no change in appetite, + multiple back surgeries but pain is controlled and feels able to lay flat without difficulty, no allergy to IVP dye.     Medical History: Hypertension, Coronary artery disease stent 1st diagonal branch 06/28/2002-low risk stress nuclear 06/2010, GERD with stricture 2003, Thyroid cyst, migraine negative MRI brain/neck March 2003,  hypercholesterolemia goal LDL less than 70, nephrolithaisis Dr Brunilda Payor 03/2009, u/s abd AAA screen neg 2009 outside testing, Varicose veins, derm Dr [place] and Dr Irene Limbo, cardiology Dr Anne Fu, Ejection fraction 48% 06/2010, Thrombocytopenia 12/2010 132,000 recheck one month repeat 140,010/2012, Father had esophogeal cancer, nephrolithiasis Dr Brunilda Payor, Urine positive for protein 06/2011 on lisinopril, *lung nodule seen 07/2011 on CT at the base 5 mm followup 6 months.      Surgical History: right side breast cyst removed , cervical spine surgery , lumbar laminectomy, discectomy by Dr. Renae Fickle , lumbar fusion L3-L5 , vasectomy , angioplasty 2004, cervical nerve block , c 2-3 fusion Dr Channing Mutters 07/2009, melanoma scalp Dr Irene Limbo 5 /2018, EGD .      Family History:  Father: deceased 74 yrs Prostate cancer, coronary artery disease, esophogeal cancer Mother: alive 76 yrs Hypertension,dementia Brother 1: alive 36 yrs Prostate cancer, coronary artery disease Sister 1: alive 78 yrs A + W Sister 2: alive 27 yrs breast ca  2 sons both hypertension and 1 had melanoma.     Social History:      General: History of smoking  cigarettes:  Never smoked. no Smoking. no Alcohol. Caffeine: yes. no Recreational drug use. 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 Marital Status: married.     2 grown sons.     Medications: Aspirin 81 MG Tablet Chewable 1 tablet Once a day, Multiple  Vitamin Tablet one tablet once a day, Folic Acid 400 MCG Tablet one tablet once a day, Theratears 0.25 % Solution 2 Capsules Once a day, Vitamin C 500 MG Tablet Chewable 2 tablets once a day, Coenzyme Q10 400 MG Capsule one tablet once a day, Vicodin 5-500 MG Tablet 2 tablet as needed for pain as needed, Viagra 50 MG Tablet TAKE 1 TABLET AS NEEDED as needed, Vitamin D 2000 units Tablet 1 tablet Once a day, Vitamin B-2 100 MG Tablet 1 tablet with a meal Once a day, Lisinopril 20 MG Tablet 1 tablet Once a day, Nexium 40 MG  Capsule Delayed Release 1 capsule Once a day, Welchol 625 MG Tablet 1 tablet with meals NOT TAKING, stop date 09/23/2011, Tart Cherry Advanced 200 MG Capsule 2 tablets Once a day, OTC Garcinia Cambogia 600 MG 1 tablet Twice a day, Medication List reviewed and reconciled with the patient     Allergies: Codeine Phosphate: nausea and vomiting, Demerol: nausea and vomiting, Morphine Sulfate: nausea and vomiting, Pravachol: muscle pain, Penicillin (for allergy): nausea and vomiting, Lescol: muscle pain, Zetia: muscle pain.      Objective:    Vitals: Wt 213.6, Wt change 2.6 lb, Ht 70.5, BMI 30.21, Pulse sitting 80, BP sitting 144/98.     Examination:     Cardiology, General:         GENERAL APPEARANCE: pleasant, NAD.  HEENT: unremarkable.  CAROTID UPSTROKE: normal, no bruit.  JVD: flat.  HEART SOUNDS: regular, normal S1, S2, no S3 or S4.  MURMUR: absent.  LUNGS: no rales or wheezes.  ABDOMEN: soft, non tender, positive bowel sounds, no masses felt.  EXTREMITIES: no leg edema.  PERIPHERAL PULSES: 2 plus bilateral.            Assessment:    Assessment:  1. Chest pain - 786.50 (Primary)   2. Coronary atherosclerosis of native coronary artery - 414.01   3. Essential hypertension, benign - 401.1     Plan:    1. Chest pain  Start Nitroglycerin 0.4 mg tablet, 0.4 mg, 1 tablet as directed, SL, as directed prn chest pain, 30 days, 1 vial, Refills 11 .         LAB: Basic Metabolic     GLUCOSE 87 70-99 - mg/dL        BUN 14 7-82 - mg/dL        CREATININE 9.56 0.60-1.30 - mg/dl        eGFR (NON-AFRICAN AMERICAN) 82 >60 - calc        eGFR (AFRICAN AMERICAN) 99 >60 - calc        SODIUM 140 136-145 - mmol/L        POTASSIUM 4.5 3.5-5.5 - mmol/L        CHLORIDE 103 98-107 - mmol/L         C02 33 22-32 - mg/dL H       ANION GAP 7.8 2.1-30.8 - mmol/L        CALCIUM 9.9 8.6-10.3 - mg/dL               FERGUSON,CYNTHIA A 11/05/2011 01:28:58 PM > ok for cath        LAB: CBC with Diff     WBC 6.8 4.0-11.0 -  K/ul        RBC 5.61 4.20-5.80 - M/uL        HGB 16.3 13.0-17.0 - g/dL        HCT 65.7 84.6-96.2 - %  MCH 29.0 27.0-33.0 - pg        MPV 7.6 7.5-10.7 - fL        MCV 89.7 80.0-94.0 - fL        MCHC 32.4 32.0-36.0 - g/dL        RDW 16.1 09.6-04.5 - %        NRBC# 0.01 -        PLT 154 150-400 - K/uL        NEUT % 61.8 43.3-71.9 - %        NRBC% 0.10 - %        LYMPH% 26.8 16.8-43.5 - %        MONO % 8.2 4.6-12.4 - %        EOS % 2.2 0.0-7.8 - %        BASO % 1.0 0.0-1.0 - %        NEUT # 4.2 1.9-7.2 - K/uL        LYMPH# 1.80 1.10-2.70 - K/uL        MONO # 0.6 0.3-0.8 - K/uL        EOS # 0.2 0.0-0.6 - K/uL        BASO # 0.1 0.0-0.1 - K/uL               FERGUSON,CYNTHIA A 11/05/2011 01:28:36 PM > ok for cath        LAB: PT and PTT ([id number])     aPTT 28 24-33 - SEC        INR 1.0 0.8-1.2 -        Prothrombin Time 11.2 9.1-12.0 - SEC               FERGUSON,CYNTHIA A 11/05/2011 01:28:45 PM > ok   Diagnostic Imaging:EKG NSR, Corson,Danielle 11/04/2011 04:18:43 PM > FERGUSON,CYNTHIA A 11/04/2011 05:14:27 PM > no significant change compared to previous EKG    Dr Anne Fu also in to see pt, will proceed with cardiac cath by radial approach at main lab later this week. Risks and benefits of cardiac catheterization have been reviewed including risk of stroke, heart attack, death, bleeding, renal impariment and arterial damage. There was ample oppurtuny to answer questions. Alternatives were discussed. Patient understands and wishes to proceed.       2. Coronary atherosclerosis of native coronary artery  Continue Aspirin Tablet Chewable, 81 MG, 1 tablet, Orally, Once a day .       3. Essential hypertension, benign  Continue Lisinopril Tablet, 20 MG, 1 tablet, Orally, Once a day .       4. Others   Continue Welchol Tablet, 625 MG, 1 tablet with meals, Orally, NOT TAKING .          Immunizations:       Labs:      Procedure Codes: 91478 EKG I AND R, 80048 ECL BMP, 85025 ECL CBC PLATELET  DIFF, 29562 BLOOD COLLECTION ROUTINE VENIPUNCTURE     Preventive:          cc to EPIC.     Follow Up: MS pending cath (Reason: CAD, s/p cath)        Provider: Michaell Cowing. Emelda Fear, NP  Patient: Ian [name], Ian [name]  DOB: 25-Jun-1945  Date: 11/04/2011   I personally spoke to him regarding his symptoms, we'll proceed with cardiac catheterization. Risks and benefits described above.

## 2011-11-07 ENCOUNTER — Encounter (HOSPITAL_COMMUNITY)
Admission: RE | Disposition: A | Discharge status: Home / Self Care | Payer: Self-pay | Source: Ambulatory Visit | Attending: Cardiology

## 2011-11-07 ENCOUNTER — Ambulatory Visit (HOSPITAL_COMMUNITY)
Admission: RE | Admit: 2011-11-07 | Discharge: 2011-11-07 | Disposition: A | Discharge status: Home / Self Care | Payer: 59 | Source: Ambulatory Visit | Attending: Cardiology | Admitting: Cardiology

## 2011-11-07 DIAGNOSIS — Z9861 Coronary angioplasty status: Secondary | ICD-10-CM | POA: Insufficient documentation

## 2011-11-07 DIAGNOSIS — I2510 Atherosclerotic heart disease of native coronary artery without angina pectoris: Secondary | ICD-10-CM | POA: Diagnosis present

## 2011-11-07 HISTORY — PX: LEFT HEART CATHETERIZATION WITH CORONARY ANGIOGRAM: SHX5451

## 2011-11-07 SURGERY — LEFT HEART CATHETERIZATION WITH CORONARY ANGIOGRAM
Anesthesia: LOCAL

## 2011-11-07 MED ORDER — LIDOCAINE HCL (PF) 1 % IJ SOLN
INTRAMUSCULAR | Status: AC
  Filled 2011-11-07: qty 30

## 2011-11-07 MED ORDER — DIAZEPAM 5 MG PO TABS
ORAL_TABLET | ORAL | Status: AC
  Administered 2011-11-07: 5 mg via ORAL
  Filled 2011-11-07: qty 1

## 2011-11-07 MED ORDER — MIDAZOLAM HCL 2 MG/2ML IJ SOLN
INTRAMUSCULAR | Status: AC
  Filled 2011-11-07: qty 2

## 2011-11-07 MED ORDER — SODIUM CHLORIDE 0.9 % IV SOLN
INTRAVENOUS | Status: DC
  Administered 2011-11-07: 09:00:00 via INTRAVENOUS

## 2011-11-07 MED ORDER — SODIUM CHLORIDE 0.9 % IJ SOLN: 3.0000 mL | Freq: Two times a day (BID) | INTRAMUSCULAR | Status: DC

## 2011-11-07 MED ORDER — HEPARIN SODIUM (PORCINE) 1000 UNIT/ML IJ SOLN
INTRAMUSCULAR | Status: AC
  Filled 2011-11-07: qty 1

## 2011-11-07 MED ORDER — SODIUM CHLORIDE 0.9 % IJ SOLN: 3.0000 mL | INTRAMUSCULAR | Status: DC | PRN

## 2011-11-07 MED ORDER — VERAPAMIL HCL 2.5 MG/ML IV SOLN
INTRAVENOUS | Status: AC
  Filled 2011-11-07: qty 2

## 2011-11-07 MED ORDER — HEPARIN (PORCINE) IN NACL 2-0.9 UNIT/ML-% IJ SOLN
INTRAMUSCULAR | Status: AC
  Filled 2011-11-07: qty 2000

## 2011-11-07 MED ORDER — DIAZEPAM 5 MG PO TABS
5.0000 mg | ORAL_TABLET | ORAL | Status: AC
  Administered 2011-11-07: 5 mg via ORAL

## 2011-11-07 MED ORDER — SODIUM CHLORIDE 0.9 % IV SOLN: 250.0000 mL | INTRAVENOUS | Status: DC | PRN

## 2011-11-07 MED ORDER — NITROGLYCERIN 0.2 MG/ML ON CALL CATH LAB
INTRAVENOUS | Status: AC
  Filled 2011-11-07: qty 1

## 2011-11-07 MED ORDER — SODIUM CHLORIDE 0.9 % IV SOLN: 1.0000 mL/kg/h | INTRAVENOUS | Status: AC

## 2011-11-07 MED ORDER — ONDANSETRON HCL 4 MG/2ML IJ SOLN: 4.0000 mg | Freq: Four times a day (QID) | INTRAMUSCULAR | Status: DC | PRN

## 2011-11-07 MED ORDER — ASPIRIN 81 MG PO CHEW
CHEWABLE_TABLET | ORAL | Status: AC
  Administered 2011-11-07: 324 mg via ORAL
  Filled 2011-11-07: qty 4

## 2011-11-07 MED ORDER — ACETAMINOPHEN 325 MG PO TABS: 650.0000 mg | ORAL_TABLET | ORAL | Status: DC | PRN

## 2011-11-07 MED ORDER — FENTANYL CITRATE 0.05 MG/ML IJ SOLN
INTRAMUSCULAR | Status: AC
  Filled 2011-11-07: qty 2

## 2011-11-07 MED ORDER — ASPIRIN 81 MG PO CHEW
324.0000 mg | CHEWABLE_TABLET | ORAL | Status: AC
  Administered 2011-11-07: 324 mg via ORAL

## 2011-11-07 NOTE — Interval H&P Note (Signed)
History and Physical Interval Note:  11/07/2011 11:00 AM  Ian [name]  has presented today for surgery, with the diagnosis of Chest pain  The various methods of treatment have been discussed with the patient and family. After consideration of risks, benefits and other options for treatment, the patient has consented to  Procedure(s) (LRB): LEFT HEART CATHETERIZATION WITH CORONARY ANGIOGRAM (N/A) as a surgical intervention .  The patient's history has been reviewed, patient examined, no change in status, stable for surgery.  I have reviewed the patient's chart and labs.  Questions were answered to the patient's satisfaction.     SKAINS, MARK  Prior films reviewed. Diagonal stent.

## 2011-11-07 NOTE — CV Procedure (Signed)
PROCEDURE:  Left heart catheterization with selective coronary angiography, left ventriculogram via the radial artery approach.  INDICATIONS:  66 year old male with coronary artery disease status post diagonal stent placed in 2004 by Dr. Amil Amen who has been experiencing symptoms similar to that prior to his stent.  The risks, benefits, and details of the procedure were explained to the patient, including possibilities of stroke, heart attack, death, renal impairment, arterial damage, bleeding.  The patient verbalized understanding and wanted to proceed.  Informed written consent was obtained.  PROCEDURE TECHNIQUE:  Allen's test was performed pre-and post procedure and was normal. The right radial artery site was prepped and draped in a sterile fashion. One percent lidocaine was used for local anesthesia. Using the modified Seldinger technique a 5 French hydrophilic sheath was inserted into the radial artery without difficulty. 3 mg of verapamil was administered via the sheath. A Judkins right #4 catheter with the guidance of a Versicore wire was placed in the right coronary cusp and selectively cannulated the right coronary artery. After traversing the aortic arch, 4500 units of heparin IV was administered. A Judkins left #3.5 catheter was used to selectively cannulate the left main artery. Multiple views with hand injection of Omnipaque were obtained. Catheter a pigtail catheter was used to cross into the left ventricle, hemodynamics were obtained, and a left ventriculogram was performed in the RAO position with power injection. Following the procedure, sheath was removed, patient was hemodynamically stable, hemostasis was maintained with a Terumo T band.   CONTRAST:  Total of 70 ml.    FLOUROSCOPY TIME: 2.6 min.  COMPLICATIONS:  None.    HEMODYNAMICS:  Aortic pressure was 145/31mmHg; LV systolic pressure was ; LVEDP .  There was no gradient between the left ventricle and aorta.     ANGIOGRAPHIC DATA:    Left main: No angiographically significant disease. Branches into LAD and circumflex.  Left anterior descending (LAD): Proximal calcification noted. There is minor stenosis, 30% in the proximal LAD. The previously placed diagonal stent is widely patent. Just prior to the stent there is approximately a 30% stenosis. Nonflow limiting.  Circumflex artery (CIRC): 2 obtuse marginal branches, widely patent with no angiographically significant disease.  Right coronary artery (RCA): Large dominant vessel giving rise to posterior descending artery with only minor luminal irregularities.  LEFT VENTRICULOGRAM:  Left ventricular angiogram was done in the 30 RAO projection and revealed mildly reduced contractile performance with an estimated ejection fraction of 45 %. Prior ejection fraction was 48% on nuclear stress test.  IMPRESSIONS:  Previously placed diagonal stent is widely patent. Minor stenosis in the proximal LAD, proximal to the stent with no flow limiting disease. Proximal LAD calcification noted. Mildly decreased left ventricular systolic function.  LVEDP 15 mmHg.  Ejection fraction 45 %.  RECOMMENDATION:  Reassurance, continue with medical management.

## 2011-11-07 NOTE — H&P (View-Only) (Signed)
Office Visit     Patient: Ian [name], Ian [name] Provider: Cynthia A. Ferguson, NP  DOB: 11/01/1945   Age: 66 Y   Sex: Male Date: 11/04/2011  Phone: [phone]  Address: [address], [place], [place]  Pcp: HAL STONEKING    --------------------------------------------------------------------------------  Subjective:    CC:      1. ms/chest pain per Dr Skains.      HPI:     General:           Mr Haddox is a 66-year-old male followed by Dr Skains with hx of coronary artery disease with previous stent in the first diagonal branch in 2004. Stress test (07/03/10) with mild decreased uptake seen at both rest and stress in the inferior wall distribution and the area of previously placed diagonal stent does not demonstrate any worsening ischemia. Ejection fraction 48%. Patient is seen today after onset intermittent left chest discomfort with slight radiation to jaw, 4/10. He initially thought it was related to GI problems with increase indigestion and gas with belching and flatus. Onset 1 week ago that is unrelated to activity has been occurring in left chest radiated into back yesterday, and today it was in his left chest but not back.He felt Tums did not help and Zantac once last week did. He takes Nexium daily.          No Nausea/vomiting/ no change in bowel habit. He feels more fatigued and states he "just does not feel like himself". Some of these symptoms remind him of when he had his stent in 2004. Denies any SOB, palpitations, PND, or diaphoresis. He does not have NTG..      ROS:      as noted in HPI, no fever, chills congestion, no change in appetite, + multiple back surgeries but pain is controlled and feels able to lay flat without difficulty, no allergy to IVP dye.     Medical History: Hypertension, Coronary artery disease stent 1st diagonal branch 06/28/2002-low risk stress nuclear 06/2010, GERD with stricture 2003, Thyroid cyst, migraine negative MRI brain/neck March 2003,  hypercholesterolemia goal LDL less than 70, nephrolithaisis Dr Nesi 03/2009, u/[name] abd AAA screen neg 2009 outside testing, Varicose veins, derm Dr Jordan and Dr Goodrich, cardiology Dr Skains, Ejection fraction 48% 06/2010, Thrombocytopenia 12/2010 132,000 recheck one month repeat 140,010/2012, Father had esophogeal cancer, nephrolithiasis Dr Nesi, Urine positive for protein 06/2011 on lisinopril, *lung nodule seen 07/2011 on CT at the base 5 mm followup 6 months.      Surgical History: right side breast cyst removed , cervical spine surgery , lumbar laminectomy, discectomy by Dr. Paul , lumbar fusion L3-L5 , vasectomy , angioplasty 2004, cervical nerve block , c 2-3 fusion Dr Roy 07/2009, melanoma scalp Dr Goodrich 5 /2018, EGD .      Family History:  Father: deceased 87 yrs Prostate cancer, coronary artery disease, esophogeal cancer Mother: alive 88 yrs Hypertension,dementia Brother 1: alive 65 yrs Prostate cancer, coronary artery disease Sister 1: alive 61 yrs A + W Sister 2: alive 57 yrs breast ca  2 sons both hypertension and 1 had melanoma.     Social History:      General: History of smoking  cigarettes:  Never smoked. no Smoking. no Alcohol. Caffeine: yes. no Recreational drug use. 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 Marital Status: married.     2 grown sons.     Medications: Aspirin 81 MG Tablet Chewable 1 tablet Once a day, Multiple   Vitamin Tablet one tablet once a day, Folic Acid 400 MCG Tablet one tablet once a day, Theratears 0.25 % Solution 2 Capsules Once a day, Vitamin C 500 MG Tablet Chewable 2 tablets once a day, Coenzyme Q10 400 MG Capsule one tablet once a day, Vicodin 5-500 MG Tablet 2 tablet as needed for pain as needed, Viagra 50 MG Tablet TAKE 1 TABLET AS NEEDED as needed, Vitamin D 2000 units Tablet 1 tablet Once a day, Vitamin B-2 100 MG Tablet 1 tablet with a meal Once a day, Lisinopril 20 MG Tablet 1 tablet Once a day, Nexium 40 MG  Capsule Delayed Release 1 capsule Once a day, Welchol 625 MG Tablet 1 tablet with meals NOT TAKING, stop date 09/23/2011, Tart Cherry Advanced 200 MG Capsule 2 tablets Once a day, OTC Garcinia Cambogia 600 MG 1 tablet Twice a day, Medication List reviewed and reconciled with the patient     Allergies: Codeine Phosphate: nausea and vomiting, Demerol: nausea and vomiting, Morphine Sulfate: nausea and vomiting, Pravachol: muscle pain, Penicillin (for allergy): nausea and vomiting, Lescol: muscle pain, Zetia: muscle pain.      Objective:    Vitals: Wt 213.6, Wt change 2.6 lb, Ht 70.5, BMI 30.21, Pulse sitting 80, BP sitting 144/98.     Examination:     Cardiology, General:         GENERAL APPEARANCE: pleasant, NAD.  HEENT: unremarkable.  CAROTID UPSTROKE: normal, no bruit.  JVD: flat.  HEART SOUNDS: regular, normal S1, S2, no S3 or S4.  MURMUR: absent.  LUNGS: no rales or wheezes.  ABDOMEN: soft, non tender, positive bowel sounds, no masses felt.  EXTREMITIES: no leg edema.  PERIPHERAL PULSES: 2 plus bilateral.            Assessment:    Assessment:  1. Chest pain - 786.50 (Primary)   2. Coronary atherosclerosis of native coronary artery - 414.01   3. Essential hypertension, benign - 401.1     Plan:    1. Chest pain  Start Nitroglycerin 0.4 mg tablet, 0.4 mg, 1 tablet as directed, SL, as directed prn chest pain, 30 days, 1 vial, Refills 11 .         LAB: Basic Metabolic     GLUCOSE 87 70-99 - mg/dL        BUN 14 6-26 - mg/dL        CREATININE 0.93 0.60-1.30 - mg/dl        eGFR (NON-AFRICAN AMERICAN) 82 >60 - calc        eGFR (AFRICAN AMERICAN) 99 >60 - calc        SODIUM 140 136-145 - mmol/L        POTASSIUM 4.5 3.5-5.5 - mmol/L        CHLORIDE 103 98-107 - mmol/L         C02 33 22-32 - mg/dL H       ANION GAP 7.8 6.0-20.0 - mmol/L        CALCIUM 9.9 8.6-10.3 - mg/dL               FERGUSON,CYNTHIA A 11/05/2011 01:28:58 PM > ok for cath        LAB: CBC with Diff     WBC 6.8 4.0-11.0 -  K/ul        RBC 5.61 4.20-5.80 - M/uL        HGB 16.3 13.0-17.0 - g/dL        HCT 50.3 39.0-52.0 - %          MCH 29.0 27.0-33.0 - pg        MPV 7.6 7.5-10.7 - fL        MCV 89.7 80.0-94.0 - fL        MCHC 32.4 32.0-36.0 - g/dL        RDW 13.1 11.5-15.5 - %        NRBC# 0.01 -        PLT 154 150-400 - K/uL        NEUT % 61.8 43.3-71.9 - %        NRBC% 0.10 - %        LYMPH% 26.8 16.8-43.5 - %        MONO % 8.2 4.6-12.4 - %        EOS % 2.2 0.0-7.8 - %        BASO % 1.0 0.0-1.0 - %        NEUT # 4.2 1.9-7.2 - K/uL        LYMPH# 1.80 1.10-2.70 - K/uL        MONO # 0.6 0.3-0.8 - K/uL        EOS # 0.2 0.0-0.6 - K/uL        BASO # 0.1 0.0-0.1 - K/uL               FERGUSON,CYNTHIA A 11/05/2011 01:28:36 PM > ok for cath        LAB: PT and PTT (020321)     aPTT 28 24-33 - SEC        INR 1.0 0.8-1.2 -        Prothrombin Time 11.2 9.1-12.0 - SEC               FERGUSON,CYNTHIA A 11/05/2011 01:28:45 PM > ok   Diagnostic Imaging:EKG NSR, Corson,Danielle 11/04/2011 04:18:43 PM > FERGUSON,CYNTHIA A 11/04/2011 05:14:27 PM > no significant change compared to previous EKG    Dr Skains also in to see pt, will proceed with cardiac cath by radial approach at main lab later this week. Risks and benefits of cardiac catheterization have been reviewed including risk of stroke, heart attack, death, bleeding, renal impariment and arterial damage. There was ample oppurtuny to answer questions. Alternatives were discussed. Patient understands and wishes to proceed.       2. Coronary atherosclerosis of native coronary artery  Continue Aspirin Tablet Chewable, 81 MG, 1 tablet, Orally, Once a day .       3. Essential hypertension, benign  Continue Lisinopril Tablet, 20 MG, 1 tablet, Orally, Once a day .       4. Others   Continue Welchol Tablet, 625 MG, 1 tablet with meals, Orally, NOT TAKING .          Immunizations:       Labs:      Procedure Codes: 93000 EKG I AND R, 80048 ECL BMP, 85025 ECL CBC PLATELET  DIFF, 36415 BLOOD COLLECTION ROUTINE VENIPUNCTURE     Preventive:          cc to EPIC.     Follow Up: MS pending cath (Reason: CAD, [name]/p cath)        Provider: Cynthia A. Ferguson, NP  Patient: Tenbrink, Damere [name]  DOB: 08/26/1945  Date: 11/04/2011   I personally spoke to him regarding his symptoms, we'll proceed with cardiac catheterization. Risks and benefits described above. 

## 2011-11-11 ENCOUNTER — Other Ambulatory Visit: Payer: Self-pay | Admitting: Gastroenterology

## 2011-11-11 DIAGNOSIS — R109 Unspecified abdominal pain: Secondary | ICD-10-CM

## 2011-11-12 ENCOUNTER — Ambulatory Visit
Admission: RE | Admit: 2011-11-12 | Discharge: 2011-11-12 | Disposition: A | Discharge status: Home / Self Care | Payer: 59 | Source: Ambulatory Visit | Attending: Gastroenterology | Admitting: Gastroenterology

## 2011-11-12 DIAGNOSIS — R109 Unspecified abdominal pain: Secondary | ICD-10-CM

## 2011-11-14 ENCOUNTER — Other Ambulatory Visit: Payer: Self-pay | Admitting: Gastroenterology

## 2011-11-14 DIAGNOSIS — R079 Chest pain, unspecified: Secondary | ICD-10-CM

## 2011-11-15 ENCOUNTER — Ambulatory Visit
Admission: RE | Admit: 2011-11-15 | Discharge: 2011-11-15 | Disposition: A | Discharge status: Home / Self Care | Payer: 59 | Source: Ambulatory Visit | Attending: Gastroenterology | Admitting: Gastroenterology

## 2011-11-15 DIAGNOSIS — R079 Chest pain, unspecified: Secondary | ICD-10-CM

## 2011-11-15 MED ORDER — IOHEXOL 300 MG/ML  SOLN
75.0000 mL | Freq: Once | INTRAMUSCULAR | Status: AC | PRN
  Administered 2011-11-15: 75 mL via INTRAVENOUS

## 2011-12-03 ENCOUNTER — Other Ambulatory Visit (HOSPITAL_COMMUNITY): Payer: Self-pay | Admitting: Geriatric Medicine

## 2011-12-03 DIAGNOSIS — M899 Disorder of bone, unspecified: Secondary | ICD-10-CM

## 2011-12-03 DIAGNOSIS — M949 Disorder of cartilage, unspecified: Secondary | ICD-10-CM

## 2011-12-09 ENCOUNTER — Encounter (HOSPITAL_COMMUNITY)
Admission: RE | Admit: 2011-12-09 | Discharge: 2011-12-09 | Disposition: A | Discharge status: Home / Self Care | Payer: Managed Care, Other (non HMO) | Source: Ambulatory Visit | Attending: Geriatric Medicine | Admitting: Geriatric Medicine

## 2011-12-09 DIAGNOSIS — M899 Disorder of bone, unspecified: Secondary | ICD-10-CM | POA: Insufficient documentation

## 2011-12-09 DIAGNOSIS — M949 Disorder of cartilage, unspecified: Secondary | ICD-10-CM | POA: Insufficient documentation

## 2011-12-09 MED ORDER — TECHNETIUM TC 99M MEDRONATE IV KIT
25.0000 | PACK | Freq: Once | INTRAVENOUS | Status: AC | PRN
  Administered 2011-12-09: 25 via INTRAVENOUS

## 2012-04-16 ENCOUNTER — Other Ambulatory Visit: Payer: Self-pay | Admitting: Geriatric Medicine

## 2012-04-16 DIAGNOSIS — J984 Other disorders of lung: Secondary | ICD-10-CM

## 2012-04-21 ENCOUNTER — Ambulatory Visit
Admission: RE | Admit: 2012-04-21 | Discharge: 2012-04-21 | Disposition: A | Discharge status: Home / Self Care | Payer: Managed Care, Other (non HMO) | Source: Ambulatory Visit | Attending: Geriatric Medicine | Admitting: Geriatric Medicine

## 2012-04-21 DIAGNOSIS — J984 Other disorders of lung: Secondary | ICD-10-CM

## 2012-04-21 MED ORDER — IOHEXOL 300 MG/ML  SOLN
75.0000 mL | Freq: Once | INTRAMUSCULAR | Status: AC | PRN
  Administered 2012-04-21: 75 mL via INTRAVENOUS

## 2012-05-04 ENCOUNTER — Other Ambulatory Visit: Payer: Self-pay | Admitting: Geriatric Medicine

## 2012-05-04 DIAGNOSIS — R911 Solitary pulmonary nodule: Secondary | ICD-10-CM

## 2012-10-19 ENCOUNTER — Ambulatory Visit
Admission: RE | Admit: 2012-10-19 | Discharge: 2012-10-19 | Disposition: A | Discharge status: Home / Self Care | Payer: 59 | Source: Ambulatory Visit | Attending: Geriatric Medicine | Admitting: Geriatric Medicine

## 2012-10-19 DIAGNOSIS — R911 Solitary pulmonary nodule: Secondary | ICD-10-CM

## 2012-10-19 MED ORDER — IOHEXOL 300 MG/ML  SOLN
75.0000 mL | Freq: Once | INTRAMUSCULAR | Status: AC | PRN
  Administered 2012-10-19: 75 mL via INTRAVENOUS

## 2013-02-03 ENCOUNTER — Other Ambulatory Visit: Payer: Self-pay | Admitting: Gastroenterology

## 2013-11-04 ENCOUNTER — Other Ambulatory Visit: Payer: Self-pay | Admitting: Geriatric Medicine

## 2013-11-04 DIAGNOSIS — R911 Solitary pulmonary nodule: Secondary | ICD-10-CM

## 2013-11-15 ENCOUNTER — Ambulatory Visit
Admission: RE | Admit: 2013-11-15 | Discharge: 2013-11-15 | Disposition: A | Discharge status: Home / Self Care | Payer: 59 | Source: Ambulatory Visit | Attending: Geriatric Medicine | Admitting: Geriatric Medicine

## 2013-11-15 DIAGNOSIS — R911 Solitary pulmonary nodule: Secondary | ICD-10-CM

## 2014-01-11 ENCOUNTER — Encounter: Payer: Self-pay | Admitting: *Deleted

## 2014-01-30 ENCOUNTER — Emergency Department (INDEPENDENT_AMBULATORY_CARE_PROVIDER_SITE_OTHER): Payer: 59

## 2014-01-30 ENCOUNTER — Encounter (HOSPITAL_COMMUNITY): Payer: Self-pay | Admitting: Emergency Medicine

## 2014-01-30 ENCOUNTER — Emergency Department (HOSPITAL_COMMUNITY)
Admission: EM | Admit: 2014-01-30 | Discharge: 2014-01-30 | Disposition: A | Discharge status: Home / Self Care | Payer: 59 | Source: Home / Self Care | Attending: Family Medicine | Admitting: Family Medicine

## 2014-01-30 DIAGNOSIS — S93401A Sprain of unspecified ligament of right ankle, initial encounter: Secondary | ICD-10-CM

## 2014-01-30 HISTORY — DX: Atherosclerosis of aorta: I70.0

## 2014-01-30 NOTE — Discharge Instructions (Signed)

## 2014-01-30 NOTE — ED Notes (Signed)
Ankle wrapped per Z. Baker, [place].

## 2014-01-30 NOTE — ED Notes (Signed)
Reports getting up from stool on Friday morning - as soon as he put his right foot on the floor, felt sudden onset severe pain/cramping in "a band around heel/ankle".  Shortly thereafter noticed discoloration/ecchymosis around posterior ankle and to right foot.  Denies any parasthesias.  Right foot, warm with 2+ DP pulse.  Now pain radiating up into right thigh.  Denies any hx blood clots.

## 2014-01-30 NOTE — ED Provider Notes (Signed)
CSN: [id number]     Arrival date & time 01/30/14  1406 History   First MD Initiated Contact with Patient 01/30/14 1459     Chief Complaint  Patient presents with  . Leg Pain   (Consider location/radiation/quality/duration/timing/severity/associated sxs/prior Treatment) HPI     68 year old male presents for evaluation of right foot and ankle pain with discoloration of his right foot. This initially started on Friday morning, 2 days ago. He stepped down onto the floor and had sudden severe pain in a band like distribution across the front of his ankle. Since then he has developed pain around the back of his ankle, radiating up his calf his thigh. The pain is increased with any ambulation and with inverting his ankle. He has some bruising around his ankles that seems to settle down between his toes. They also said that the foot is warmer than the contralateral foot and there is some swelling around the ankle. He has no history of DVT or PE, but he is concerned about possibility of a DVT. No recent travel. No other symptoms. No chest pain or shortness of breath. No numbness in the extremities.  Past Medical History  Diagnosis Date  . HTN (hypertension)   . GERD (gastroesophageal reflux disease)   . Thyroid cyst   . Migraine   . Hypercholesterolemia   . Varicose veins     ejection fraction 48% 06/2010  . Thrombocytopenia   . Urine findings abnormal     positive protein  . Lung nodule     rt lower lobe  . Diverticulosis   . Coronary atherosclerosis of native coronary artery   . Atherosclerosis of aorta   . Nephrolithiasis    Past Surgical History  Procedure Laterality Date  . Diagonal stent  10/2011   Family History  Problem Relation Age of Onset  . Hypertension Mother   . Dementia Mother   . Cancer - Prostate Father   . Coronary artery disease Father   . Cancer Father     esophogeal  . Cancer Sister     breast, one sister  . Cancer - Prostate Brother   . Cancer Maternal  Grandmother     colon   History  Substance Use Topics  . Smoking status: Never Smoker   . Smokeless tobacco: Not on file  . Alcohol Use: No    Review of Systems  Musculoskeletal:       See history of present illness regarding complaint in the right leg  All other systems reviewed and are negative.   Allergies  Adhesive; Codeine; Demerol; Lescol; Morphine and related; Oxycodone; Penicillins; Pravachol; Welchol; and Zetia  Home Medications   Prior to Admission medications   Medication Sig Start Date End Date Taking? Authorizing Provider  aspirin EC 81 MG tablet Take 81 mg by mouth daily.   Yes Historical Provider, MD  Cholecalciferol (VITAMIN D) 2000 UNITS CAPS Take 1 capsule by mouth daily.   Yes Historical Provider, MD  CHOLESTYRAMINE PO Take by mouth 2 (two) times daily.   Yes Historical Provider, MD  Coenzyme Q10 (COQ10) 400 MG CAPS Take 1 tablet by mouth daily.   Yes Historical Provider, MD  Cyanocobalamin (VITAMIN B-12 PO) Take 400 mg by mouth daily.   Yes Historical Provider, MD  DHA-EPA-Flaxseed Oil-Vitamin E (THERA TEARS) CAPS Take 1 capsule by mouth daily.   Yes Historical Provider, MD  esomeprazole (NEXIUM) 40 MG capsule Take 40 mg by mouth daily before breakfast.   Yes Historical Provider,  MD  folic acid (FOLVITE) 117 MCG tablet Take 400 mcg by mouth daily.   Yes Historical Provider, MD  Glucosamine HCl-MSM (GLUCOSAMINE-MSM PO) Take 500 mg by mouth daily.    Yes Historical Provider, MD  HYDROcodone-acetaminophen (VICODIN) 5-500 MG per tablet Take 2 tablets by mouth every 6 (six) hours as needed for pain.   Yes Historical Provider, MD  lisinopril (PRINIVIL,ZESTRIL) 20 MG tablet Take 20 mg by mouth daily.   Yes Historical Provider, MD  metaxalone (SKELAXIN) 800 MG tablet Take 800 mg by mouth 2 (two) times daily.   Yes Historical Provider, MD  Misc Natural Products (TART CHERRY ADVANCED) CAPS Take 1 capsule by mouth daily.   Yes Historical Provider, MD  nitroGLYCERIN  (NITROSTAT) 0.4 MG SL tablet Place 0.4 mg under the tongue every 5 (five) minutes as needed. For chest pain   Yes Historical Provider, MD  sildenafil (VIAGRA) 50 MG tablet Take 50 mg by mouth daily as needed for erectile dysfunction.   Yes Historical Provider, MD  vitamin C (ASCORBIC ACID) 500 MG tablet Take 500 mg by mouth 2 (two) times daily.   Yes Historical Provider, MD   BP 151/88  Pulse 64  Temp(Src) 98.1 F (36.7 C) (Oral)  Resp 18  SpO2 97% Physical Exam  Nursing note and vitals reviewed. Constitutional: He is oriented to person, place, and time. He appears well-developed and well-nourished. No distress.  HENT:  Head: Normocephalic.  Cardiovascular:  Pulses:      Dorsalis pedis pulses are 2+ on the right side, and 2+ on the left side.  Pulmonary/Chest: Effort normal. No respiratory distress.  Musculoskeletal:       Right ankle: He exhibits normal range of motion, no swelling, no ecchymosis, no deformity and normal pulse. Tenderness. Lateral malleolus and AITFL tenderness found. No medial malleolus, no CF ligament, no posterior TFL, no head of 5th metatarsal and no proximal fibula tenderness found. Achilles tendon normal.       Right foot: He exhibits normal range of motion, no tenderness, no swelling, normal capillary refill and no deformity.  There is mild ecchymosis around the bases of the toes on the right foot.  Neurological: He is alert and oriented to person, place, and time. He has normal strength and normal reflexes. No sensory deficit. He exhibits normal muscle tone. He displays a negative Romberg sign. Coordination and gait normal.  Skin: Skin is warm and dry. No rash noted. He is not diaphoretic.  Psychiatric: He has a normal mood and affect. Judgment normal.    ED Course  Procedures (including critical care time) Labs Review Labs Reviewed - No data to display  Imaging Review Dg Ankle Complete Right  01/30/2014   CLINICAL DATA:  Right ankle pain since Friday   EXAM: RIGHT ANKLE - COMPLETE 3+ VIEW  COMPARISON:  None.  FINDINGS: There is no evidence of fracture, dislocation, or joint effusion. There is no evidence of arthropathy or other focal bone abnormality. Soft tissues are unremarkable.  IMPRESSION: No acute osseous injury of the right ankle.   Electronically Signed   By: Kathreen Devoid   On: 01/30/2014 15:56   Dg Foot Complete Right  01/30/2014   CLINICAL DATA:  no injury, pain since this past Friday morning in the right foot & ankle. More painful in the morning. Pain stated to be from the posterior right os calcis to the right anterior ankle. Patient is not a diabetic, no history of Gout. No prior injury to the right  foot  EXAM: RIGHT FOOT COMPLETE - 3+ VIEW  COMPARISON:  None.  FINDINGS: There is no evidence of fracture or dislocation. There is evidence of prior right hallux valgus repair with a pin in place. Moderate osteoarthritis of the first MTP joint. There is no evidence of arthropathy or other focal bone abnormality. Soft tissues are unremarkable.  IMPRESSION: No acute osseous injury of the knee right foot.   Electronically Signed   By: Kathreen Devoid   On: 01/30/2014 15:46     MDM   1. Ankle sprain, right, initial encounter    X-rays are normal. Treat symptomatically. Will given Ace wrap for support. Ice, rest followup when necessary       Liam Graham, PA-C 01/30/14 1634

## 2014-01-31 NOTE — ED Provider Notes (Signed)
Medical screening examination/treatment/procedure(s) were performed by a resident physician or non-physician practitioner and as the supervising physician I was immediately available for consultation/collaboration.  Lynne Leader, MD    Gregor Hams, MD 01/31/14 [phone]

## 2014-03-24 ENCOUNTER — Encounter (HOSPITAL_COMMUNITY): Payer: Self-pay | Admitting: Cardiology

## 2015-05-30 ENCOUNTER — Other Ambulatory Visit: Payer: Self-pay | Admitting: Nurse Practitioner

## 2015-05-30 ENCOUNTER — Ambulatory Visit
Admission: RE | Admit: 2015-05-30 | Discharge: 2015-05-30 | Disposition: A | Discharge status: Home / Self Care | Payer: BLUE CROSS/BLUE SHIELD | Source: Ambulatory Visit | Attending: Nurse Practitioner | Admitting: Nurse Practitioner

## 2015-05-30 DIAGNOSIS — M5481 Occipital neuralgia: Secondary | ICD-10-CM

## 2016-04-15 HISTORY — PX: CARPAL TUNNEL RELEASE: SHX101

## 2016-07-26 ENCOUNTER — Ambulatory Visit: Payer: Self-pay | Admitting: General Surgery

## 2016-07-29 ENCOUNTER — Encounter (HOSPITAL_COMMUNITY): Payer: Self-pay

## 2016-07-29 NOTE — Pre-Procedure Instructions (Addendum)
Ian [name]  07/29/2016      [id number] [address], [place] [address] Phone: [phone] Fax: [phone]    Your procedure is scheduled on April 19  Report to [facility] at 1000 A.M.  Call this number if you have problems the morning of surgery:  [phone]   Remember:  Do not eat food or drink liquids after midnight.  Take these medicines the morning of surgery with A SIP OF WATER Zyrtec if needed, nitro if needed, eye drops if needed  Stop/take Asprin as directed by your Dr. Stop taking BC's, Goody's, Herbal medications, fish Oil, Ibuprofen, Advil, Motrin, Aleve, Vitamins, Naproxen (Anaprox)   Do not wear jewelry, make-up or nail polish.  Do not wear lotions, powders, or perfumes, or deoderant.  Do not shave 48 hours prior to surgery.  Men may shave face and neck.  Do not bring valuables to the hospital.  [facility] is not responsible for any belongings or valuables.  Contacts, dentures or bridgework may not be worn into surgery.  Leave your suitcase in the car.  After surgery it may be brought to your room.  For patients admitted to the hospital, discharge time will be determined by your treatment team.  Patients discharged the day of surgery will not be allowed to drive home.   Special instructions:  [facility] - Preparing for Surgery  Before surgery, you can play an important role.  Because skin is not sterile, your skin needs to be as free of germs as possible.  You can reduce the number of germs on you skin by washing with CHG (chlorahexidine gluconate) soap before surgery.  CHG is an antiseptic cleaner which kills germs and bonds with the skin to continue killing germs even after washing.  Please DO NOT use if you have an allergy to CHG or antibacterial soaps.  If your skin becomes reddened/irritated stop using the CHG and inform your nurse when you arrive at [facility]  Stay.  Do not shave (including legs and underarms) for at least 48 hours prior to the first CHG shower.  You may shave your face.  Please follow these instructions carefully:   1.  Shower with CHG Soap the night before surgery and the   morning of Surgery.  2.  If you choose to wash your hair, wash your hair first as usual with your  normal shampoo.  3.  After you shampoo, rinse your hair and body thoroughly to remove the Shampoo.  4.  Use CHG as you would any other liquid soap.  You can apply chg directly to the skin and wash gently with scrungie or a clean washcloth.  5.  Apply the CHG Soap to your body ONLY FROM THE NECK DOWN.   Do not use on open wounds or open sores.  Avoid contact with your eyes,   ears, mouth and genitals (private parts).  Wash genitals (private parts)   with your normal soap.  6.  Wash thoroughly, paying special attention to the area where your surgery  will be performed.  7.  Thoroughly rinse your body with warm water from the neck down.  8.  DO NOT shower/wash with your normal soap after using and rinsing off the CHG Soap.  9.  Pat yourself dry with a clean towel.            10.  Wear clean pajamas.  11.  Place clean sheets on your bed the night of your first shower and do not sleep with pets.  Day of Surgery  Do not apply any lotions/deoderants the morning of surgery.  Please wear clean clothes to the hospital/surgery center.     Please read over the following fact sheets that you were given. Pain Booklet, Coughing and Deep Breathing and Surgical Site Infection Prevention, Incentive Spirometry

## 2016-07-30 ENCOUNTER — Ambulatory Visit (HOSPITAL_COMMUNITY)
Admission: RE | Admit: 2016-07-30 | Discharge: 2016-07-30 | Disposition: A | Discharge status: Home / Self Care | Payer: BLUE CROSS/BLUE SHIELD | Source: Ambulatory Visit | Attending: General Surgery | Admitting: General Surgery

## 2016-07-30 ENCOUNTER — Encounter (HOSPITAL_COMMUNITY)
Admission: RE | Admit: 2016-07-30 | Discharge: 2016-07-30 | Disposition: A | Discharge status: Home / Self Care | Payer: BLUE CROSS/BLUE SHIELD | Source: Ambulatory Visit | Attending: General Surgery | Admitting: General Surgery

## 2016-07-30 ENCOUNTER — Encounter (HOSPITAL_COMMUNITY): Payer: Self-pay

## 2016-07-30 DIAGNOSIS — Z01818 Encounter for other preprocedural examination: Secondary | ICD-10-CM | POA: Insufficient documentation

## 2016-07-30 DIAGNOSIS — Z01812 Encounter for preprocedural laboratory examination: Secondary | ICD-10-CM | POA: Diagnosis not present

## 2016-07-30 DIAGNOSIS — I700 Atherosclerosis of aorta: Secondary | ICD-10-CM | POA: Diagnosis not present

## 2016-07-30 DIAGNOSIS — I4891 Unspecified atrial fibrillation: Secondary | ICD-10-CM | POA: Diagnosis not present

## 2016-07-30 DIAGNOSIS — K4090 Unilateral inguinal hernia, without obstruction or gangrene, not specified as recurrent: Secondary | ICD-10-CM | POA: Diagnosis not present

## 2016-07-30 HISTORY — DX: Unspecified osteoarthritis, unspecified site: M19.90

## 2016-07-30 HISTORY — DX: Personal history of urinary calculi: Z87.442

## 2016-07-30 LAB — BASIC METABOLIC PANEL
ANION GAP: 5 (ref 5–15)
BUN: 17 mg/dL (ref 6–20)
CO2: 30 mmol/L (ref 22–32)
Calcium: 9.4 mg/dL (ref 8.9–10.3)
Chloride: 106 mmol/L (ref 101–111)
Creatinine, Ser: 0.87 mg/dL (ref 0.61–1.24)
GFR calc Af Amer: >60 mL/min (ref >60)
GFR calc non Af Amer: >60 mL/min (ref >60)
Glucose, Bld: 104 mg/dL — ABNORMAL HIGH (ref 65–99)
Potassium: 4.5 mmol/L (ref 3.5–5.1)
Sodium: 141 mmol/L (ref 135–145)

## 2016-07-30 LAB — CBC WITH DIFFERENTIAL/PLATELET
Basophils Absolute: 0 10*3/uL (ref 0.0–0.1)
Basophils Relative: 1 %
EOS PCT: 4 %
Eosinophils Absolute: 0.2 10*3/uL (ref 0.0–0.7)
HEMATOCRIT: 46.5 % (ref 39.0–52.0)
Hemoglobin: 15.1 g/dL (ref 13.0–17.0)
LYMPHS ABS: 2.1 10*3/uL (ref 0.7–4.0)
LYMPHS PCT: 40 %
MCH: 29 pg (ref 26.0–34.0)
MCHC: 32.5 g/dL (ref 30.0–36.0)
MCV: 89.3 fL (ref 78.0–100.0)
MONOS PCT: 8 %
Monocytes Absolute: 0.4 10*3/uL (ref 0.1–1.0)
NEUTROS ABS: 2.6 10*3/uL (ref 1.7–7.7)
Neutrophils Relative %: 47 %
PLATELETS: 155 10*3/uL (ref 150–400)
RBC: 5.21 MIL/uL (ref 4.22–5.81)
RDW: 13.6 % (ref 11.5–15.5)
WBC: 5.4 10*3/uL (ref 4.0–10.5)

## 2016-07-30 NOTE — Progress Notes (Signed)
PCP is Dr. Lajean Manes Cardiologist is Dr. Marlou Porch- last saw 2017 Denies any chest pain, shortness of breath, cough, or fever. Last card cath was 2013 Reports his last stress test was 2013-requested report Denies ever having an echo.

## 2016-07-31 ENCOUNTER — Encounter (HOSPITAL_COMMUNITY): Payer: Self-pay | Admitting: Vascular Surgery

## 2016-07-31 ENCOUNTER — Encounter (HOSPITAL_COMMUNITY): Payer: Self-pay

## 2016-07-31 ENCOUNTER — Encounter (HOSPITAL_COMMUNITY): Payer: Self-pay | Admitting: Anesthesiology

## 2016-07-31 NOTE — Progress Notes (Signed)
Anesthesia chart review: Patient is a 71 year old male scheduled for open left inguinal hernia repair on 08/01/2016 by Dr. Hulen Skains.  History includes never smoker, CAD s/p DIAG1 stent 06/28/02, dysrhythmia, aortic atherosclerosis, bilatateral lung nodules (felt to be benign by f/u CT imaging 11/15/13), hypertension, GERD, thyroid cyst, varicose veins, migraines, hypercholesterolemia, thrombocytopenia (mild) '12, diverticulosis, nephrolithiasis, arthritis, skin cancer, C2-3 posterior fusion '11, L3-S1 fusion 10/28/03, brain surgery (not otherwise specified).  PCP is Dr. Lajean Manes. Cardiologist is Dr. Candee Furbish. He reported last office visit 2017, but last visit I see is from 2013 cath. (I did request last records from the former [facility], but they are pending. He may have been seen since the cath, but to my recollection Dr. Marlou Porch has been with [facility] since 2014 and office notes would be in Epic.)  Meds include ASA 81 mg, Zyrtec, doxycycline, Nexium, folic acid, Vicoprofen, lisinopril, magnesium oxide, nitroglycerin, riboflavin, Viagra, Xiidra ophthalmic.  EKG 07/30/16: SB at 52 bpm. Since previous tracing, SB had replaced afib since 07/14/09 tracing in [facility]. (I reviewed the 07/14/09 tracing. It looked like SR in leads I and V1-3 and more of a saw-tooth pattern in inferior leads.)  Cardiac cath 11/07/11: ANGIOGRAPHIC DATA:   - Left main: No angiographically significant disease. Branches into LAD and circumflex. - Left anterior descending (LAD): Proximal calcification noted. There is minor stenosis, 30% in the proximal LAD. The previously placed diagonal stent is widely patent. Just prior to the stent there is approximately a 30% stenosis. Nonflow limiting. - Circumflex artery (CIRC): 2 obtuse marginal branches, widely patent with no angiographically significant disease. - Right coronary artery (RCA): Large dominant vessel giving rise to posterior descending artery with only minor luminal  irregularities. LEFT VENTRICULOGRAM:  Left ventricular angiogram was done in the 30 RAO projection and revealed mildly reduced contractile performance with an estimated ejection fraction of 45 %. Prior ejection fraction was 48% on nuclear stress test. IMPRESSIONS: 1. Previously placed diagonal stent is widely patent. Minor stenosis in the proximal LAD, proximal to the stent with no flow limiting disease. Proximal LAD calcification noted. 2. Mildly decreased left ventricular systolic function.  LVEDP 15 mmHg.  Ejection fraction 45 %. RECOMMENDATION:  Reassurance, continue with medical management.  CXR 07/30/16: IMPRESSION: No active cardiopulmonary disease. Aortic atherosclerosis.  Chest CT 11/15/13: IMPRESSION: 1. Stable bilateral pulmonary nodularity dating back to 11/15/2011. Stability for 2 years confirms a benign etiology. 2. No enlarging nodules or acute findings demonstrated. 3. Evidence of prior granulomatous disease with calcified mediastinal and right hilar lymph nodes and granulomas in the liver and spleen. 4. Atherosclerosis as described.  Preoperative labs noted. Glucose 104. Cr 0.87. CBC WNL.   Above discussed with anesthesiologist Dr. Lissa Hoard. Patient with known CAD, mild LV dysfunction. He has not had recent cardiology follow-up. For elective surgery, preoperative cardiology evaluation recommended. I have have notified triage nursing staff at [place]. If Dr. Hulen Skains feels surgery is urgent then I've asked that he or his staff contact me or Dr. Lissa Hoard.   [facility] Short Stay Center/Anesthesiology Phone [phone] 07/31/2016 3:40 PM

## 2016-08-01 ENCOUNTER — Telehealth: Payer: Self-pay | Admitting: Cardiology

## 2016-08-01 ENCOUNTER — Encounter (HOSPITAL_COMMUNITY): Admission: RE | Payer: Self-pay | Source: Ambulatory Visit

## 2016-08-01 ENCOUNTER — Ambulatory Visit (HOSPITAL_COMMUNITY)
Admission: RE | Admit: 2016-08-01 | Payer: BLUE CROSS/BLUE SHIELD | Source: Ambulatory Visit | Admitting: General Surgery

## 2016-08-01 SURGERY — REPAIR, HERNIA, INGUINAL, ADULT
Anesthesia: General | Laterality: Left

## 2016-08-01 NOTE — Telephone Encounter (Signed)
Did not need this encounter °

## 2016-08-02 ENCOUNTER — Ambulatory Visit (INDEPENDENT_AMBULATORY_CARE_PROVIDER_SITE_OTHER): Payer: BLUE CROSS/BLUE SHIELD | Admitting: Cardiology

## 2016-08-02 ENCOUNTER — Encounter: Payer: Self-pay | Admitting: Cardiology

## 2016-08-02 DIAGNOSIS — I2510 Atherosclerotic heart disease of native coronary artery without angina pectoris: Secondary | ICD-10-CM | POA: Diagnosis not present

## 2016-08-02 DIAGNOSIS — Z9861 Coronary angioplasty status: Secondary | ICD-10-CM

## 2016-08-02 DIAGNOSIS — Z8679 Personal history of other diseases of the circulatory system: Secondary | ICD-10-CM | POA: Diagnosis not present

## 2016-08-02 DIAGNOSIS — Z01810 Encounter for preprocedural cardiovascular examination: Secondary | ICD-10-CM | POA: Diagnosis not present

## 2016-08-02 NOTE — Patient Instructions (Signed)
Medication Instructions:  Your physician recommends that you continue on your current medications as directed. Please refer to the Current Medication list given to you today.    Any Other Special Instructions Will Be Listed Below (If Applicable).  Dr. Ellyn Hack has deemed you low risk from a cardiac standpoint for your upcoming surgical procedure.  If you need a refill on your cardiac medications before your next appointment, please call your pharmacy.

## 2016-08-02 NOTE — Progress Notes (Signed)
PCP: Mathews Argyle, MD  Surgeon: Dr. Armandina Gemma [facility] ([facility])  Clinic Note: Chief Complaint  Patient presents with  . New Patient (Initial Visit)    Pt states no Sx.   . Medical Clearance    Consultation    HPI: Ian [name] is a 71 y.o. male who is being seen today for the Pre-operative Cardiovascular Examination at the request of Lajean Manes, MD & Dr. Armandina Gemma  Or Dr. Hulen Skains from [place] has a distant history of known coronary disease status post PCI to the first diagonal branch back in 2004. He then had a follow-up catheterization in 2013 showed the stent was open with 30% stenosis in the LAD and diagonal with an EF of roughly 45%. He was then discharged from cardiology and has been followed by his primary care physician since.  Recent Hospitalizations: n/a  Studies Reviewed: reports & films reviewed personally  March 2014: PCI to Diag1-- Taxus DES 2.5 mg 12 mm (Dr. Leonia Reeves)  Cardiac cath July 2013: Patent stent. EF 45%. Mild proximal LAD calcification  ~30%. 30% D1 just prior to stent.. (This was done in response to stress test that showed possible inferior wall defect) no evidence of ischemia. Despite this result is still has some chest discomfort - he therefore was referred for cath  Interval History: Ian [name] presents today for preoperative risk evaluation for an inguinal hernia surgery. He is actually in the process of planning to move to [place] for retirement. He was referred for cardiology clearance based on anesthesiology concerns. He has been relatively asymptomatic from a cardiac standpoint for several years now. His initial cath and PCI was done in response to exertional dyspnea while walking into the airport pulling his luggage. He had similar type of symptoms back in 2013, but catheterization showed widely patent stent. Despite LV Gram suggesting mildly reduced EF, he has not had any heart failure symptoms either.  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 He denies any resting or exertional chest tightness or pressure. No PND OR orthopnea. He is easily able to exercise more than 4-6 METS.  He has not been walking as much because of his inguinal hernia. He is also lost a significant amount of weight in the last year. He doesn't really have much edema, but he does have chronic varicose veins in the left greater than right lower extremity. Does not really have symptoms of significant venous insufficiency with no aching or soreness of the legs. No discoloration of skin yet.  --Cardiovascular Review of Symptoms:  No palpitations, lightheadedness, dizziness, weakness or syncope/near syncope. No TIA/amaurosis fugax symptoms. No claudication.  ROS: A comprehensive was performed. Review of Systems  Constitutional: Positive for weight loss (Intentional). Negative for fever and malaise/fatigue.  HENT: Negative for congestion, hearing loss and nosebleeds.   Respiratory: Negative for cough, shortness of breath and wheezing.   Gastrointestinal: Negative for blood in stool, heartburn and melena.  Genitourinary: Negative for hematuria.       Left inguinal soreness from his hernia  Musculoskeletal: Negative for falls, joint pain and myalgias.       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 He therefore cannot do extensive walking, still exercises.  Neurological: Negative for dizziness, tingling and focal weakness.  Endo/Heme/Allergies: Negative for environmental allergies.  Psychiatric/Behavioral: Negative.   All other systems reviewed and are negative.  Past Medical History:  Diagnosis Date  . Arthritis   . Atherosclerosis of aorta ([place])   . CAD S/P percutaneous coronary angioplasty 06/2002   Cath showed subtotal occlusion of D1 -- staged PCI with Taxus DES 2.5 x 12; 08/2011: Cath for persistent CP despite normal Myoview -> 30%  pLAD & pD1 with patent D1 stent.  EF ~45%.  . Cancer ([place])    skin cancer  . Diverticulosis   . Dysrhythmia   . GERD (gastroesophageal reflux disease)   . History of kidney stones   . HTN (hypertension)   . Hypercholesterolemia   . Lung nodule    rt lower lobe  . Migraine   . Nephrolithiasis   . PAF (paroxysmal atrial fibrillation) ([place]) 2011   seen on EKG  . Thrombocytopenia ([place])   . Thyroid cyst   . Urine findings abnormal    positive protein  . Varicose veins    ejection fraction 48% 06/2010    Past Surgical History:  Procedure Laterality Date  . BRAIN SURGERY     times 3  . CERVICAL FUSION  1995; ~2008   Twice  . CORONARY STENT INTERVENTION  06/28/2002   DIAG1 PCI: Taxus DES 2.5 x 12   . diagonal stent     DIAG1 DES 06/28/02  . LEFT HEART CATHETERIZATION WITH CORONARY ANGIOGRAM N/A 11/07/2011   Procedure: LEFT HEART CATHETERIZATION WITH CORONARY ANGIOGRAM;  Surgeon: Candee Furbish, MD;  Location: [facility] CATH LAB;  Service: Cardiovascular: - Equivocal persistent symptoms despite nonischemic Myoview. EF ~45%. 30% calcified pLAD, 30% D1 prestent. Widely paent D1 DES stent.  . [place], 2010, 2011   Total 3 surgeries  . TRIGGER FINGER RELEASE      Current Meds  Medication Sig  . aspirin EC 81 MG tablet Take 81 mg by mouth 3 (three) times a week.   . Calcium Carb-Cholecalciferol (CALCIUM 600+D) 600-800 MG-UNIT TABS Take 2 tablets by mouth daily.  . cetirizine (ZYRTEC) 10 MG tablet Take 10 mg by mouth daily as needed for allergies.  . Coenzyme Q10 (COQ10) 400 MG CAPS Take 400 mg by mouth daily.   Marland Kitchen doxycycline (VIBRA-TABS) 100 MG tablet Take 100 mg by mouth 2 (two) times daily.  Marland Kitchen esomeprazole (NEXIUM) 20 MG capsule Take 20 mg by mouth daily at 12 noon.  . Eyelid Cleansers (AVENOVA) 0.01 % SOLN Place 1 application into both eyes 2 (two) times daily.  . folic acid (FOLVITE) 962 MCG tablet Take 400 mcg by mouth daily.  . Glucosamine HCl-MSM (GLUCOSAMINE-MSM PO)  Take 500 mg by mouth daily.   . Glucosamine-Chondroit-Vit C-Mn (GLUCOSAMINE CHONDR 1500 COMPLX) CAPS Take 1 capsule by mouth daily.  Marland Kitchen HYDROcodone-ibuprofen (VICOPROFEN) 7.5-200 MG tablet Take 1 tablet by mouth every 6 (six) hours as needed for severe pain.  Marland Kitchen lisinopril (PRINIVIL,ZESTRIL) 20 MG tablet Take 20 mg by mouth every evening.   . magnesium oxide (MAG-OX) 400 MG tablet Take 400 mg by mouth daily.  . Misc Natural Products (TART CHERRY ADVANCED) CAPS Take 2 capsules by mouth daily.   . Multiple Vitamins-Minerals (OCUVITE ADULT 50+) CAPS Take 1 capsule by mouth daily.  . naproxen sodium (ANAPROX) 220 MG tablet Take 220-440 mg by mouth 2 (two) times daily as needed (pain).  . nitroGLYCERIN (NITROSTAT) 0.4 MG SL tablet Place 0.4 mg under the tongue every 5 (five) minutes as needed. For chest pain  . Riboflavin 100 MG TABS Take 400 mg by mouth daily.  . sildenafil (  VIAGRA) 50 MG tablet Take 50 mg by mouth daily as needed for erectile dysfunction.  Marland Kitchen tobramycin-dexamethasone (TOBRADEX) ophthalmic solution Place 1 drop into both eyes 2 (two) times daily as needed for dry eyes.  . vitamin B-12 (CYANOCOBALAMIN) 1000 MCG tablet Take 1,000 mcg by mouth daily.  . vitamin C (ASCORBIC ACID) 500 MG tablet Take 1,000 mg by mouth daily.   Marland Kitchen XIIDRA 5 % SOLN Place 1 drop into both eyes 2 (two) times daily as needed for dry eyes.    Allergies  Allergen Reactions  . Adhesive [Tape] Rash    Band aid adhesive ---- RASH Skin peels off  . Codeine Nausea And Vomiting  . Demerol [Meperidine] Nausea And Vomiting  . Lescol [Fluvastatin Sodium] Other (See Comments)    Muscle pain   . Morphine And Related Nausea And Vomiting  . Oxycodone Nausea And Vomiting  . Penicillins Nausea And Vomiting    Has patient had a PCN reaction causing immediate rash, facial/tongue/throat swelling, SOB or lightheadedness with hypotension: No Has patient had a PCN reaction causing severe rash involving mucus membranes or skin  necrosis: No Has patient had a PCN reaction that required hospitalization No Has patient had a PCN reaction occurring within the last 10 years: No If all of the above answers are "NO", then may proceed with Cephalosporin use.   Christy Sartorius Sodium] Other (See Comments)    Muscle pain   . Welchol [Colesevelam Hcl] Other (See Comments)    Weakness   . Zetia [Ezetimibe] Other (See Comments)    Muscle pain     Social History   Social History  . Marital status: Married    Spouse name: N/A  . Number of children: 2  . Years of education: 16   Occupational History  . Sales representative     Works for [employer] - plans to retire the end of June 2018.   Social History Main Topics  . Smoking status: Never Smoker  . Smokeless tobacco: Never Used  . Alcohol use No  . Drug use: No  . Sexual activity: Not Asked   Other Topics Concern  . None   Social History Narrative   He is a married father of 2, grandfather of 63. He currently lives with his wife. 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 He is active, but does not do routine exercise. He does most yard work and is able to walk in the stores etc.    family history includes Cancer in his sister; Cancer - Prostate in his brother; Cervical cancer in his maternal grandmother; Coronary artery disease in his father; Dementia in his mother; Esophageal cancer in his father; Heart attack (age of onset: 51) in his maternal grandfather; Hypertension in his mother.  Wt Readings from Last 3 Encounters:  08/02/16 190 lb 3.2 oz (86.3 kg)  07/30/16 194 lb 1.6 oz (88 kg)  11/07/11 207 lb (93.9 kg)    PHYSICAL EXAM BP 137/73   Pulse (!) 57   Ht 5' 10.5" (1.791 m)   Wt 190 lb 3.2 oz (86.3 kg)   BMI 26.91 kg/m  General appearance: alert, cooperative, appears stated age, no distress and Well-nourished, well-groomed. Neck: no adenopathy, no carotid bruit and no JVD Lungs: clear to  auscultation bilaterally, normal percussion bilaterally and non-labored Heart: regular rate and rhythm, S1 & S2 normal, no murmur, click, rub or gallop; non-displaced PMI Abdomen: soft, non-tender; bowel sounds  normal; no masses,  no organomegaly; no HJR Extremities: extremities normal, atraumatic, no cyanosis, or edema. L>R plump varicose veins.  Non-tender.  Pulses: 2+ and symmetric;  Skin: no abnormal pigmentation, no evidence of bleeding or bruising, no lesions noted, temperature normal and no venous stasis dermatitis or varicosities - lower leg(s) bilateral Neurologic: Mental status: Alert, oriented, thought content appropriate Cranial nerves: normal (II-XII grossly intact)    Adult ECG Report - no EKG from today. From 07/30/16  Rate: 52 ;  Rhythm: sinus bradycardia and Normal axis, intervals and durations.;   Narrative Interpretation: essentially normal   Other studies Reviewed: Additional studies/ records that were reviewed today include:  Recent Labs:  Labs are followed by PCP No results found for: CHOL, HDL, LDLCALC, LDLDIRECT, TRIG, CHOLHDL  ASSESSMENT / PLAN: Problem List Items Addressed This Visit    CAD S/P percutaneous coronary angioplasty (Chronic)    No further anginal symptoms since his last cardiac catheterization. No longer on an antiplatelet agent. He has not used when necessary nitroglycerin in a long time. He is on lisinopril and CoQ10. He is not on a beta blocker most likely related to resting bradycardia. He is on aspirin. No statin.  With no active symptoms, there is no need to investigate preoperatively for any potential ischemic CAD. He was quite symptomatic when he did have ischemic CAD, therefore stress test at this point would not change management.  He has been managed by his PCP for years now and has been stable.      History of atrial fibrillation without current medication (Chronic)    When I reviewed his EKG, comparing images from previous EKGs on  [facility] he had an EKG with atrial fibrillation back in April 2011. Patient himself was not aware of A. fib as a diagnosis, and has not been on either rate control agents or rhythm mitral agents, anticoagulation. Doesn't seem to be asymptomatic at all to suggest any recurrence of A. fib, therefore this point I would simply monitor and treat accordingly if it reoccurs.  This patients CHA2DS2-VASc Score and unadjusted Ischemic Stroke Rate (% per year) is equal to 2.2 % stroke rate/year from a score of 2 -- Above score calculated as 1 point each if present [CHF, HTN, DM, Vascular=MI/PAD/Aortic Plaque, Age if 23-74, or Male]; Above score calculated as 2 points each if present [Age > 75, or Stroke/TIA/TE] -- As he has not had any evidence of recurrence of A. fib since 2011 from a symptom standpoint, he probably is fine with just aspirin for stroke prevention provided there is no recurrence.      Preoperative cardiovascular examination    PREOPERATIVE CARDIAC RISK ASSESSMENT   Revised Cardiac Risk Index:  High Risk Surgery: no; low risk not intraperitoneal surgery  Defined as Intraperitoneal, intrathoracic or suprainguinal vascular  Active CAD: no; he is revascularized no recurrent symptoms  CHF: no; despite having mildly reduced EF, no  Cerebrovascular Disease: no;   Diabetes: no; On Insulin: no  CKD (Cr >~ 2): no;   Total: 0 Estimated Risk of Adverse Outcome: LOW RISK for LOW RISK surgery  Estimated Risk of MI, PE, VF/VT (Cardiac Arrest), Complete Heart Block: <1 %   ACC/AHA Guidelines for "Clearance":  Step 1 - Need for Emergency Surgery: No:  If Yes - go straight to OR with perioperative surveillance  Step 2 - Active Cardiac Conditions (Unstable Angina, Decompensated HF, Significant  Arrhytmias - Complete HB, Mobitz II, Symptomatic VT or SVT, Severe Aortic Stenosis -  mean gradient > 40 mmHg, Valve area < 1.0 cm2):   No: No active angina or heart failure  If Yes - Evaluate &  Treat per ACC/AHA Guidelines  Step 3 -  Low Risk Surgery: Yes  If Yes --> proceed to OR  If No --> Step 4  Step 4 - Functional Capacity >= 4 METS without symptoms: Yes  If Yes --> proceed to OR  If No --> Step 5  Step 5 --  Clinical Risk Factors (CRF) - none (see above)  No CRFs: Yes  If Yes --> Proceed to OR  Based on these data, the recommendation would be to proceed to the OR without any further cardiac evaluation as this would not change our management. We will be available if necessary, however I have a low that this will be needed. He has not artery on a beta blocker, but does have a history of at least one EKG with A. fib. If atrial fibrillation recurs, please contact cardiology to assist with management, but would preferentially use beta blocker perioperatively.        Since he will be moving to [place], I don't think he needs to follow-up with [place] from a cardiology standpoint as he has been stable. Continue to manage his blood sugars lipids and blood pressure. This patients CHA2DS2-VASc Score and unadjusted Ischemic Stroke Rate (% per year) is equal to 2.2 % stroke rate/year from a score of 2 -- Above score calculated as 1 point each if present [CHF, HTN, DM, Vascular=MI/PAD/Aortic Plaque, Age if 62-74, or Male]; Above score calculated as 2 points each if present [Age > 75, or Stroke/TIA/TE] -- As he has not had any evidence of recurrence of A. fib since 2011 from a symptom standpoint, he probably is fine with just aspirin for stroke prevention provided there is no recurrence.   Current medicines are reviewed at length with the patient today. (+/- concerns) n/a The following changes have been made: n/a  Patient Instructions  Medication Instructions:  Your physician recommends that you continue on your current medications as directed. Please refer to the Current Medication list given to you today.    Any Other Special Instructions Will Be Listed Below (If  Applicable).  Dr. Ellyn Hack has deemed you low risk from a cardiac standpoint for your upcoming surgical procedure.  If you need a refill on your cardiac medications before your next appointment, please call your pharmacy.     Studies Ordered:   No orders of the defined types were placed in this encounter.     Glenetta Hew, M.D., M.S. Interventional Cardiologist   Pager # [phone] Phone # [phone] [address]. [place], [place] [phone]

## 2016-08-04 ENCOUNTER — Encounter: Payer: Self-pay | Admitting: Cardiology

## 2016-08-04 DIAGNOSIS — Z01810 Encounter for preprocedural cardiovascular examination: Secondary | ICD-10-CM | POA: Insufficient documentation

## 2016-08-04 DIAGNOSIS — Z8679 Personal history of other diseases of the circulatory system: Secondary | ICD-10-CM | POA: Insufficient documentation

## 2016-08-04 NOTE — Assessment & Plan Note (Addendum)
No further anginal symptoms since his last cardiac catheterization. No longer on an antiplatelet agent. He has not used when necessary nitroglycerin in a long time. He is on lisinopril and CoQ10. He is not on a beta blocker most likely related to resting bradycardia. He is on aspirin. No statin.  With no active symptoms, there is no need to investigate preoperatively for any potential ischemic CAD. He was quite symptomatic when he did have ischemic CAD, therefore stress test at this point would not change management.  He has been managed by his PCP for years now and has been stable.

## 2016-08-04 NOTE — Assessment & Plan Note (Signed)
PREOPERATIVE CARDIAC RISK ASSESSMENT   Revised Cardiac Risk Index:  High Risk Surgery: no; low risk not intraperitoneal surgery  Defined as Intraperitoneal, intrathoracic or suprainguinal vascular  Active CAD: no; he is revascularized no recurrent symptoms  CHF: no; despite having mildly reduced EF, no  Cerebrovascular Disease: no;   Diabetes: no; On Insulin: no  CKD (Cr >~ 2): no;   Total: 0 Estimated Risk of Adverse Outcome: LOW RISK for LOW RISK surgery  Estimated Risk of MI, PE, VF/VT (Cardiac Arrest), Complete Heart Block: <1 %   ACC/AHA Guidelines for "Clearance":  Step 1 - Need for Emergency Surgery: No:  If Yes - go straight to OR with perioperative surveillance  Step 2 - Active Cardiac Conditions (Unstable Angina, Decompensated HF, Significant  Arrhytmias - Complete HB, Mobitz II, Symptomatic VT or SVT, Severe Aortic Stenosis - mean gradient > 40 mmHg, Valve area < 1.0 cm2):   No: No active angina or heart failure  If Yes - Evaluate & Treat per ACC/AHA Guidelines  Step 3 -  Low Risk Surgery: Yes  If Yes --> proceed to OR  If No --> Step 4  Step 4 - Functional Capacity >= 4 METS without symptoms: Yes  If Yes --> proceed to OR  If No --> Step 5  Step 5 --  Clinical Risk Factors (CRF) - none (see above)  No CRFs: Yes  If Yes --> Proceed to OR  Based on these data, the recommendation would be to proceed to the OR without any further cardiac evaluation as this would not change our management. We will be available if necessary, however I have a low that this will be needed. He has not artery on a beta blocker, but does have a history of at least one EKG with A. fib. If atrial fibrillation recurs, please contact cardiology to assist with management, but would preferentially use beta blocker perioperatively.

## 2016-08-04 NOTE — Assessment & Plan Note (Addendum)
When I reviewed his EKG, comparing images from previous EKGs on [facility] he had an EKG with atrial fibrillation back in April 2011. Patient himself was not aware of A. fib as a diagnosis, and has not been on either rate control agents or rhythm mitral agents, anticoagulation. Doesn't seem to be asymptomatic at all to suggest any recurrence of A. fib, therefore this point I would simply monitor and treat accordingly if it reoccurs.  This patients CHA2DS2-VASc Score and unadjusted Ischemic Stroke Rate (% per year) is equal to 2.2 % stroke rate/year from a score of 2 -- Above score calculated as 1 point each if present [CHF, HTN, DM, Vascular=MI/PAD/Aortic Plaque, Age if 25-74, or Male]; Above score calculated as 2 points each if present [Age > 75, or Stroke/TIA/TE] -- As he has not had any evidence of recurrence of A. fib since 2011 from a symptom standpoint, he probably is fine with just aspirin for stroke prevention provided there is no recurrence.

## 2016-08-06 ENCOUNTER — Ambulatory Visit: Payer: Self-pay | Admitting: Surgery

## 2016-08-06 NOTE — Progress Notes (Signed)
Anesthesia follow-up: Patient is a 71 year old male scheduled for open left inguinal hernia repair on 08/08/16 by Dr. Harlow Asa at [facility]. Procedure was initially scheduled for 08/01/16 by Dr. Hulen Skains, but was postponed to allow for cardiology evaluation. (He has known CAD and had not been evaluated since 01/06/13 by Dr. Candee Furbish.).   Patient has since been evaluated by cardiologist Dr. Glenetta Hew on 08/04/16. Patient was asymptomatic from a CV standpoint and had no known recurrent afib since 2011, so Dr. Ellyn Hack felt patient was okay to proceed (low risk) to the OR without any further cardiac evaluation. If afib recurs then he recommended to "contact cardiology to assist with management, but would preferentially use beta blocker perioperatively." (Patient is moving to [place] in the near future, so no additional cardiology follow-up arrange at [facility].)  History includes never smoker, CAD s/p DIAG1 stent 06/28/02, dysrhythmia (PAF 07/2009), aortic atherosclerosis, bilatateral lung nodules (felt to be benign by f/u CT imaging 11/15/13), hypertension, GERD, thyroid cyst, varicose veins, migraines, hypercholesterolemia, thrombocytopenia (mild) '12, diverticulosis, nephrolithiasis, arthritis, skin cancer, C2-3 posterior fusion '11, L3-S1 fusion 10/28/03, brain surgery (not otherwise specified).  PCP is Dr. Lajean Manes.  Meds include ASA 81 mg, Zyrtec, doxycycline, Nexium, folic acid, Vicoprofen, lisinopril, magnesium oxide, nitroglycerin, riboflavin, Viagra, Xiidra ophthalmic.  EKG 07/30/16: SB at 52 bpm. Since previous tracing, SB had replaced afib since 07/14/09 tracing in [place].  Cardiac cath 11/07/11: ANGIOGRAPHIC DATA: - Left main: No angiographically significant disease. Branches into LAD and circumflex. - Left anterior descending (LAD): Proximal calcification noted. There is minor stenosis, 30% in the proximal LAD. The previously placed diagonal stent is widely patent. Just prior to the stent there  is approximately a 30% stenosis. Nonflow limiting. - Circumflex artery (CIRC): 2 obtuse marginal branches, widely patent with no angiographically significant disease. - Right coronary artery (RCA): Large dominant vessel giving rise to posterior descending artery with only minor luminal irregularities. LEFT VENTRICULOGRAM:Left ventricular angiogram was done in the 30 RAO projection and revealed mildly reduced contractile performance with an estimated ejection fraction of 45 %. Prior ejection fraction was 48% on nuclear stress test. IMPRESSIONS: 1. Previously placed diagonal stent is widely patent. Minor stenosis in the proximal LAD, proximal to the stent with no flow limiting disease. Proximal LAD calcification noted. 2. Mildly decreased left ventricular systolic function. LVEDP 15 mmHg. Ejection fraction 45 %. RECOMMENDATION:Reassurance, continue with medical management.  Nuclear stress treadmill test 07/03/10 [facility]): Overall low risk, no areas of significant ischemia. Low normal ejection fraction 48% with generalized hypokinesis.  CXR 07/30/16: IMPRESSION: No active cardiopulmonary disease. Aortic atherosclerosis.  Chest CT 11/15/13: IMPRESSION: 1. Stable bilateral pulmonary nodularity dating back to 11/15/2011. Stability for 2 years confirms a benign etiology. 2. No enlarging nodules or acute findings demonstrated. 3. Evidence of prior granulomatous disease with calcified mediastinal and right hilar lymph nodes and granulomas in the liver and spleen. 4. Atherosclerosis as described.  Preoperative labs from 07/30/16 noted. Glucose 104. Cr 0.87. CBC WNL.   Patient had been cleared by cardiology. If no acute changes then I anticipate that he can proceed as planned.  [facility] Stay [facility]/Anesthesiology Phone [phone] 08/06/2016 6:19 PM

## 2016-08-07 ENCOUNTER — Encounter (HOSPITAL_COMMUNITY): Payer: Self-pay | Admitting: *Deleted

## 2016-08-07 DIAGNOSIS — K4090 Unilateral inguinal hernia, without obstruction or gangrene, not specified as recurrent: Secondary | ICD-10-CM

## 2016-08-07 NOTE — H&P (Signed)
General Surgery [facility]  PAULO KEIMIG 08/06/2016 9:25 AM Location: [facility] Patient #: [id number] DOB: October 06, 1945 Married / Language: Undefined / Race: White Male   History of Present Illness Earnstine Regal MD; 08/06/2016 10:07 AM) The patient is a 71 year old male who presents with an inguinal hernia. CC: [facility]  Patient presents for evaluation for left inguinal hernia repair. Patient had previously been seen by my partner, Dr. Judeth Horn, and prepared for surgery. Surgery was postponed due to the need for cardiac evaluation and clearance. Unfortunately this did not allow for Dr. Hulen Skains to perform the procedure due to scheduling concerns. Therefore the patient comes to see me today for assessment and is scheduled for repair 2 days from now at [place] has been obtained from his cardiologist, Dr. Ellyn Hack. Patient has stopped taking his baby aspirin. Patient continues to have intermittent minor discomfort from the left groin. He presents today for assessment.   Allergies Mammie Lorenzo, LPN; 3/76/2831 5:17 AM) Adhesive Tape  Codeine Sulfate *ANALGESICS - OPIOID*  Demerol *ANALGESICS - OPIOID*  Lescol *ANTIHYPERLIPIDEMICS*  Morphine Derivatives  OxyCODONE HCl (Abuse Deter) *ANALGESICS - OPIOID*  Penicillins  Pravachol *ANTIHYPERLIPIDEMICS*  Welchol *ANTIHYPERLIPIDEMICS*  Zetia *ANTIHYPERLIPIDEMICS*   Medication History Mammie Lorenzo, LPN; 09/29/735 1:06 AM) Lisinopril (20MG  Tablet, Oral) Active. Xiidra (5% Solution, Ophthalmic) Active. Vitamin D (2000UNIT Capsule, Oral) Active. CoQ10 (400MG  Capsule, Oral) Active. Vitamin B12 (100MCG Tablet, Oral) Active. Thera Tears Nutrition (Oral) Active. NexIUM (40MG  Capsule DR, Oral) Active. Folic Acid (269SWN Tablet, Oral) Active. Glucosamine Chondroitin Complx (Oral) Active. Viagra (50MG  Tablet, Oral) Active. Vitamin C (500MG  Tablet, Oral)  Active. Medications Reconciled  Vitals Claiborne Billings Dockery LPN; 4/62/7035 0:09 AM) 08/06/2016 9:25 AM Weight: 194.8 lb Height: 70in Body Surface Area: 2.06 m Body Mass Index: 27.95 kg/m  Temp.: 98.8F(Oral)  Pulse: 88 (Regular)  BP: 120/64 (Sitting, Left Arm, Standard)       Physical Exam Earnstine Regal MD; 08/06/2016 10:08 AM) The physical exam findings are as follows: Note:Constitutional: See vital signs recorded above  General appearance: normal development, nutritional status normal, no deformities  Eyes: conjunctiva and lids normal without lesion; pupils equal and reactive; iris normal bilaterally  Ears, nose, mouth, throat: external exam without lesion or mass; hearing grossly normal; lips normal; teeth normal for age; gums without obvious disease; oral mucosa moist, tongue normal  Neck: symmetric on extension; trachea midline; no crepitance; no masses  Thyroid: normal in size, no palpable nodules, no tenderness  Chest - clear bilaterally without rhonchi, rales, or wheeze  Cor - regular rhythm with normal rate; no significant murmur  Abd - soft without distension; no umbilical hernia  GU - palpation in the right inguinal canal with cough and Valsalva shows no sign of hernia; obvious bulge on the left side; palpation in the left inguinal canal shows a small to moderate-sized left inguinal hernia which augments with coughing and Valsalva; hernia is reducible  Ext - non-tender without significant edema or lymphedema  Neuro - grossly intact; no tremor    Assessment & Plan Earnstine Regal MD; 08/06/2016 10:09 AM) LEFT INGUINAL HERNIA (K40.90) Current Plans Pt Education - Pamphlet Given - Hernia Surgery: discussed with patient and provided information. Patient presents with left inguinal hernia for repair. Patient is provided with written literature and hernia repaired to review at home.  After discussion with my partner, Dr. Hulen Skains, and examining the  patient, we have decided to proceed with left inguinal  hernia repair with mesh. Cardiac clearance has been obtained. Risk and benefits of the procedure been discussed with the patient including the risk of recurrence which is approximately 2%. We discussed restrictions on his activities following the procedure. The patient understands and wishes to proceed with surgery in the near future.  The risks and benefits of the procedure have been discussed at length with the patient. The patient understands the proposed procedure, potential alternative treatments, and the course of recovery to be expected. All of the patient's questions have been answered at this time. The patient wishes to proceed with surgery.  Earnstine Regal, MD, [facility] Office: [phone]

## 2016-08-08 ENCOUNTER — Ambulatory Visit (HOSPITAL_COMMUNITY): Payer: BLUE CROSS/BLUE SHIELD | Admitting: Vascular Surgery

## 2016-08-08 ENCOUNTER — Encounter (HOSPITAL_COMMUNITY): Payer: Self-pay | Admitting: *Deleted

## 2016-08-08 ENCOUNTER — Ambulatory Visit (HOSPITAL_COMMUNITY)
Admission: RE | Admit: 2016-08-08 | Discharge: 2016-08-08 | Disposition: A | Discharge status: Home / Self Care | Payer: BLUE CROSS/BLUE SHIELD | Source: Ambulatory Visit | Attending: Surgery | Admitting: Surgery

## 2016-08-08 ENCOUNTER — Encounter (HOSPITAL_COMMUNITY)
Admission: RE | Disposition: A | Discharge status: Home / Self Care | Payer: Self-pay | Source: Ambulatory Visit | Attending: Surgery

## 2016-08-08 DIAGNOSIS — M1990 Unspecified osteoarthritis, unspecified site: Secondary | ICD-10-CM | POA: Diagnosis not present

## 2016-08-08 DIAGNOSIS — Z885 Allergy status to narcotic agent status: Secondary | ICD-10-CM | POA: Diagnosis not present

## 2016-08-08 DIAGNOSIS — E7800 Pure hypercholesterolemia, unspecified: Secondary | ICD-10-CM | POA: Diagnosis not present

## 2016-08-08 DIAGNOSIS — I739 Peripheral vascular disease, unspecified: Secondary | ICD-10-CM | POA: Insufficient documentation

## 2016-08-08 DIAGNOSIS — K4090 Unilateral inguinal hernia, without obstruction or gangrene, not specified as recurrent: Secondary | ICD-10-CM | POA: Insufficient documentation

## 2016-08-08 DIAGNOSIS — Z85828 Personal history of other malignant neoplasm of skin: Secondary | ICD-10-CM | POA: Diagnosis not present

## 2016-08-08 DIAGNOSIS — Z888 Allergy status to other drugs, medicaments and biological substances status: Secondary | ICD-10-CM | POA: Diagnosis not present

## 2016-08-08 DIAGNOSIS — I480 Paroxysmal atrial fibrillation: Secondary | ICD-10-CM | POA: Diagnosis not present

## 2016-08-08 DIAGNOSIS — I10 Essential (primary) hypertension: Secondary | ICD-10-CM | POA: Diagnosis not present

## 2016-08-08 DIAGNOSIS — Z880 Allergy status to penicillin: Secondary | ICD-10-CM | POA: Diagnosis not present

## 2016-08-08 DIAGNOSIS — Z955 Presence of coronary angioplasty implant and graft: Secondary | ICD-10-CM | POA: Insufficient documentation

## 2016-08-08 DIAGNOSIS — Z79899 Other long term (current) drug therapy: Secondary | ICD-10-CM | POA: Insufficient documentation

## 2016-08-08 DIAGNOSIS — K219 Gastro-esophageal reflux disease without esophagitis: Secondary | ICD-10-CM | POA: Insufficient documentation

## 2016-08-08 DIAGNOSIS — I2510 Atherosclerotic heart disease of native coronary artery without angina pectoris: Secondary | ICD-10-CM | POA: Insufficient documentation

## 2016-08-08 HISTORY — PX: INGUINAL HERNIA REPAIR: SHX194

## 2016-08-08 HISTORY — PX: INSERTION OF MESH: SHX5868

## 2016-08-08 SURGERY — REPAIR, HERNIA, INGUINAL, ADULT
Anesthesia: General | Laterality: Left

## 2016-08-08 MED ORDER — 0.9 % SODIUM CHLORIDE (POUR BTL) OPTIME
TOPICAL | Status: DC | PRN
  Administered 2016-08-08: 1000 mL

## 2016-08-08 MED ORDER — DEXAMETHASONE SODIUM PHOSPHATE 10 MG/ML IJ SOLN
INTRAMUSCULAR | Status: DC | PRN
  Administered 2016-08-08: 10 mg via INTRAVENOUS

## 2016-08-08 MED ORDER — ROCURONIUM BROMIDE 10 MG/ML (PF) SYRINGE
PREFILLED_SYRINGE | INTRAVENOUS | Status: DC | PRN
  Administered 2016-08-08: 40 mg via INTRAVENOUS

## 2016-08-08 MED ORDER — BUPIVACAINE-EPINEPHRINE (PF) 0.5% -1:200000 IJ SOLN
INTRAMUSCULAR | Status: DC | PRN
  Administered 2016-08-08: 30 mL via PERINEURAL

## 2016-08-08 MED ORDER — CHLORHEXIDINE GLUCONATE CLOTH 2 % EX PADS: 6.0000 | MEDICATED_PAD | Freq: Once | CUTANEOUS | Status: DC

## 2016-08-08 MED ORDER — SUGAMMADEX SODIUM 200 MG/2ML IV SOLN
INTRAVENOUS | Status: AC
  Filled 2016-08-08: qty 2

## 2016-08-08 MED ORDER — PROPOFOL 10 MG/ML IV BOLUS
INTRAVENOUS | Status: AC
  Filled 2016-08-08: qty 20

## 2016-08-08 MED ORDER — TRAMADOL HCL 50 MG PO TABS: 50.0000 mg | ORAL_TABLET | Freq: Four times a day (QID) | ORAL | 0 refills | Status: DC | PRN

## 2016-08-08 MED ORDER — EPHEDRINE 5 MG/ML INJ
INTRAVENOUS | Status: AC
  Filled 2016-08-08: qty 10

## 2016-08-08 MED ORDER — SUCCINYLCHOLINE CHLORIDE 200 MG/10ML IV SOSY
PREFILLED_SYRINGE | INTRAVENOUS | Status: DC | PRN
  Administered 2016-08-08: 120 mg via INTRAVENOUS

## 2016-08-08 MED ORDER — LACTATED RINGERS IV SOLN
INTRAVENOUS | Status: DC
  Administered 2016-08-08: 11:00:00 via INTRAVENOUS

## 2016-08-08 MED ORDER — FENTANYL CITRATE (PF) 100 MCG/2ML IJ SOLN
100.0000 ug | Freq: Once | INTRAMUSCULAR | Status: AC
  Administered 2016-08-08: 100 ug via INTRAVENOUS

## 2016-08-08 MED ORDER — SUGAMMADEX SODIUM 200 MG/2ML IV SOLN
INTRAVENOUS | Status: DC | PRN
  Administered 2016-08-08: 200 mg via INTRAVENOUS

## 2016-08-08 MED ORDER — HYDROCODONE-ACETAMINOPHEN 5-325 MG PO TABS
2.0000 | ORAL_TABLET | Freq: Once | ORAL | Status: AC
  Administered 2016-08-08: 2 via ORAL
  Filled 2016-08-08: qty 2

## 2016-08-08 MED ORDER — SUCCINYLCHOLINE CHLORIDE 200 MG/10ML IV SOSY
PREFILLED_SYRINGE | INTRAVENOUS | Status: AC
  Filled 2016-08-08: qty 10

## 2016-08-08 MED ORDER — MIDAZOLAM HCL 2 MG/2ML IJ SOLN
INTRAMUSCULAR | Status: AC
  Administered 2016-08-08: 2 mg
  Filled 2016-08-08: qty 2

## 2016-08-08 MED ORDER — MIDAZOLAM HCL 5 MG/ML IJ SOLN: 2.0000 mg | Freq: Once | INTRAMUSCULAR | Status: DC

## 2016-08-08 MED ORDER — ROCURONIUM BROMIDE 50 MG/5ML IV SOSY
PREFILLED_SYRINGE | INTRAVENOUS | Status: AC
  Filled 2016-08-08: qty 5

## 2016-08-08 MED ORDER — CIPROFLOXACIN IN D5W 400 MG/200ML IV SOLN
400.0000 mg | INTRAVENOUS | Status: AC
  Administered 2016-08-08: 400 mg via INTRAVENOUS

## 2016-08-08 MED ORDER — CIPROFLOXACIN IN D5W 400 MG/200ML IV SOLN
400.0000 mg | INTRAVENOUS | Status: DC
  Filled 2016-08-08: qty 200

## 2016-08-08 MED ORDER — PROPOFOL 10 MG/ML IV BOLUS
INTRAVENOUS | Status: DC | PRN
  Administered 2016-08-08: 170 mg via INTRAVENOUS

## 2016-08-08 MED ORDER — LIDOCAINE 2% (20 MG/ML) 5 ML SYRINGE
INTRAMUSCULAR | Status: AC
  Filled 2016-08-08: qty 5

## 2016-08-08 MED ORDER — HYDROMORPHONE HCL 1 MG/ML IJ SOLN
0.2500 mg | INTRAMUSCULAR | Status: DC | PRN
  Administered 2016-08-08 (×2): 0.25 mg via INTRAVENOUS

## 2016-08-08 MED ORDER — BUPIVACAINE HCL (PF) 0.5 % IJ SOLN
INTRAMUSCULAR | Status: DC | PRN
  Administered 2016-08-08: 20 mL

## 2016-08-08 MED ORDER — BUPIVACAINE HCL (PF) 0.5 % IJ SOLN
INTRAMUSCULAR | Status: AC
  Filled 2016-08-08: qty 30

## 2016-08-08 MED ORDER — LIDOCAINE 2% (20 MG/ML) 5 ML SYRINGE
INTRAMUSCULAR | Status: DC | PRN
  Administered 2016-08-08: 100 mg via INTRAVENOUS

## 2016-08-08 MED ORDER — ONDANSETRON HCL 4 MG/2ML IJ SOLN
INTRAMUSCULAR | Status: DC | PRN
  Administered 2016-08-08: 4 mg via INTRAVENOUS

## 2016-08-08 MED ORDER — ACETAMINOPHEN 500 MG PO TABS
ORAL_TABLET | ORAL | Status: AC
  Filled 2016-08-08: qty 2

## 2016-08-08 MED ORDER — HYDROMORPHONE HCL 1 MG/ML IJ SOLN
INTRAMUSCULAR | Status: AC
  Administered 2016-08-08: 0.25 mg via INTRAVENOUS
  Filled 2016-08-08: qty 1

## 2016-08-08 MED ORDER — FENTANYL CITRATE (PF) 100 MCG/2ML IJ SOLN
INTRAMUSCULAR | Status: AC
  Filled 2016-08-08: qty 2

## 2016-08-08 MED ORDER — PROMETHAZINE HCL 25 MG/ML IJ SOLN: 6.2500 mg | INTRAMUSCULAR | Status: DC | PRN

## 2016-08-08 MED ORDER — EPHEDRINE SULFATE-NACL 50-0.9 MG/10ML-% IV SOSY
PREFILLED_SYRINGE | INTRAVENOUS | Status: DC | PRN
  Administered 2016-08-08: 10 mg via INTRAVENOUS

## 2016-08-08 MED ORDER — ACETAMINOPHEN 500 MG PO TABS
1000.0000 mg | ORAL_TABLET | ORAL | Status: AC
  Administered 2016-08-08: 1000 mg via ORAL

## 2016-08-08 MED ORDER — HYDROCODONE-ACETAMINOPHEN 5-325 MG PO TABS: 1.0000 | ORAL_TABLET | ORAL | 0 refills | Status: DC | PRN

## 2016-08-08 SURGICAL SUPPLY — 39 items
ADH SKN CLS APL DERMABOND .7 (GAUZE/BANDAGES/DRESSINGS) ×1
APPLICATOR COTTON TIP 6IN STRL (MISCELLANEOUS) ×3 IMPLANT
BLADE SURG 15 STRL LF DISP TIS (BLADE) ×1 IMPLANT
BLADE SURG 15 STRL SS (BLADE) ×3
BLADE SURG SZ10 CARB STEEL (BLADE) IMPLANT
CHLORAPREP W/TINT 26ML (MISCELLANEOUS) ×6 IMPLANT
CLOSURE WOUND 1/2 X4 (GAUZE/BANDAGES/DRESSINGS) ×1
COVER SURGICAL LIGHT HANDLE (MISCELLANEOUS) ×3 IMPLANT
DECANTER SPIKE VIAL GLASS SM (MISCELLANEOUS) ×3 IMPLANT
DERMABOND ADVANCED (GAUZE/BANDAGES/DRESSINGS) ×2
DERMABOND ADVANCED .7 DNX12 (GAUZE/BANDAGES/DRESSINGS) IMPLANT
DRAIN PENROSE 18X1/2 LTX STRL (DRAIN) ×3 IMPLANT
DRAPE LAPAROTOMY TRNSV 102X78 (DRAPE) ×3 IMPLANT
ELECT PENCIL ROCKER SW 15FT (MISCELLANEOUS) ×3 IMPLANT
ELECT REM PT RETURN 15FT ADLT (MISCELLANEOUS) ×3 IMPLANT
GAUZE SPONGE 4X4 12PLY STRL (GAUZE/BANDAGES/DRESSINGS) ×3 IMPLANT
GLOVE BIOGEL PI IND STRL 7.0 (GLOVE) ×1 IMPLANT
GLOVE BIOGEL PI INDICATOR 7.0 (GLOVE) ×2
GLOVE SURG ORTHO 8.0 STRL STRW (GLOVE) ×3 IMPLANT
GOWN STRL REUS W/TWL LRG LVL3 (GOWN DISPOSABLE) ×3 IMPLANT
GOWN STRL REUS W/TWL XL LVL3 (GOWN DISPOSABLE) ×6 IMPLANT
KIT BASIN OR (CUSTOM PROCEDURE TRAY) ×3 IMPLANT
MESH ULTRAPRO 3X6 7.6X15CM (Mesh General) ×2 IMPLANT
NDL HYPO 25X1 1.5 SAFETY (NEEDLE) ×1 IMPLANT
NEEDLE HYPO 25X1 1.5 SAFETY (NEEDLE) ×3 IMPLANT
NS IRRIG 1000ML POUR BTL (IV SOLUTION) ×3 IMPLANT
PACK BASIC VI WITH GOWN DISP (CUSTOM PROCEDURE TRAY) ×3 IMPLANT
SPONGE LAP 4X18 X RAY DECT (DISPOSABLE) ×9 IMPLANT
STRIP CLOSURE SKIN 1/2X4 (GAUZE/BANDAGES/DRESSINGS) ×2 IMPLANT
SUT MNCRL AB 4-0 PS2 18 (SUTURE) ×3 IMPLANT
SUT NOVA NAB GS-21 0 18 T12 DT (SUTURE) ×2 IMPLANT
SUT NOVA NAB GS-22 2 0 T19 (SUTURE) ×6 IMPLANT
SUT NOVA T20/GS 25 (SUTURE) ×3 IMPLANT
SUT SILK 2 0 SH (SUTURE) ×3 IMPLANT
SUT VIC AB 3-0 SH 18 (SUTURE) ×3 IMPLANT
SYR BULB IRRIGATION 50ML (SYRINGE) ×3 IMPLANT
SYR CONTROL 10ML LL (SYRINGE) ×3 IMPLANT
TOWEL OR 17X26 10 PK STRL BLUE (TOWEL DISPOSABLE) ×3 IMPLANT
YANKAUER SUCT BULB TIP 10FT TU (MISCELLANEOUS) ×3 IMPLANT

## 2016-08-08 NOTE — Discharge Instructions (Signed)

## 2016-08-08 NOTE — Anesthesia Procedure Notes (Signed)
Procedure Name: Intubation Date/Time: 08/08/2016 12:19 PM Performed by: Lind Covert Pre-anesthesia Checklist: Patient identified, Emergency Drugs available, Suction available, Patient being monitored and Timeout performed Patient Re-evaluated:Patient Re-evaluated prior to inductionOxygen Delivery Method: Circle system utilized Intubation Type: IV induction Laryngoscope Size: Mac and 4 Tube type: Oral Tube size: 7.5 mm Number of attempts: 1 Airway Equipment and Method: Stylet Placement Confirmation: ETT inserted through vocal cords under direct vision,  positive ETCO2 and breath sounds checked- equal and bilateral Secured at: 22 cm Tube secured with: Tape Dental Injury: Teeth and Oropharynx as per pre-operative assessment

## 2016-08-08 NOTE — Op Note (Signed)
Inguinal Hernia, Open, Procedure Note  Pre-operative Diagnosis:  Left inguinal hernia, reducible  Post-operative Diagnosis: same  Surgeon:  Earnstine Regal, MD, FACS  Anesthesia:  General  Preparation:  Chlora-prep  Estimated Blood Loss: Minimal  Complications:  none  Indications: The patient presented with a left, reducible hernia.    Procedure Details  The patient was evaluated in the holding area. All of the patient's questions were answered and the proposed procedure was confirmed. The site of the procedure was properly marked. The patient was taken to the Operating Room, identified by name, and the procedure verified as inguinal hernia repair.  The patient was placed in the supine position and underwent induction of anesthesia. A "Time Out" was performed per routine. The lower abdomen and groin were prepped and draped in the usual aseptic fashion.  After ascertaining that an adequate level of anesthesia had been obtained, an incision was made in the groin with a #10 blade.  Dissection was carried through the subcutaneous tissues and hemostasis obtained with the electrocautery.  A Gelpi retractor was placed for exposure.  The external oblique fascia was incised in line with it's fibers and extended through the external inguinal ring.  The cord structures were dissected out of the inguinal canal and encircled with a Penrose drain.  The floor of the inguinal canal was dissected out.  There was a moderate direct inguinal hernia defect with bulge.  This was closed with interrupted 0-Novofil figure of eight sutures.  The cord was explored and a lipoma was excised.  There was a moderate indirect inguinal hernia sac which was dissected out and a high ligation performed with a 2-0 Silk suture and the sac excised and discarded.  The floor of the inguinal canal was reconstructed with Ethicon Ultrapro mesh cut to the appropriate dimensions.  It was secured to the pubic tubercle with a 2-0 Novafil  suture and along the inguinal ligament with a running 2-0 Novafil suture.  Mesh was split to accommodate the cord structures.  The superior margin of the mesh was secured to the transversalis and internal oblique musculature with interrupted 2-0 Novafil sutures.  The tails of the mesh were overlapped lateral to the cord structures and secured to the inguinal ligament with interrupted 2-0 Novafil sutures to recreate the internal inguinal ring.  Cord structures were returned to the inguinal canal.  Local anesthetic was infiltrated throughout the field.  External oblique fascia was closed with interrupted 3-0 Vicryl sutures.  Subcutaneous tissues were closed with interrupted 3-0 Vicryl sutures.  Skin was anesthetized with local anesthetic, and the skin edges were re-approximated with a running 4-0 Monocryl suture.  Wound was washed and dried and Dermabond was applied.  Instrument, sponge, and needle counts were correct prior to closure and at the conclusion of the case.  The patient tolerated the procedure well.  The patient was awakened from anesthesia and brought to the recovery room in stable condition.  Earnstine Regal, MD, [facility] Office: [phone]

## 2016-08-08 NOTE — Anesthesia Procedure Notes (Signed)
Anesthesia Regional Block: TAP block   Pre-Anesthetic Checklist: ,, timeout performed, Correct Patient, Correct Site, Correct Laterality, Correct Procedure, Correct Position, site marked, Risks and benefits discussed,  Surgical consent,  Pre-op evaluation,  At surgeon's request and post-op pain management  Laterality: Left  Prep: chloraprep       Needles:  Injection technique: Single-shot  Needle Type: Echogenic Stimulator Needle     Needle Length: 9cm  Needle Gauge: 21   Needle insertion depth: 8 cm   Additional Needles:   Procedures: ultrasound guided,,,,,,,,  Narrative:  Start time: 08/08/2016 11:48 AM End time: 08/08/2016 11:54 AM Injection made incrementally with aspirations every 5 mL.  Performed by: Personally  Anesthesiologist: Josephine Igo  Additional Notes: Timeout performed. Patient sedated. Relevant anatomy ID'd using [place]. Incremental 21ml injection with frequent aspiration. Patient tolerated procedure well.

## 2016-08-08 NOTE — Interval H&P Note (Signed)
History and Physical Interval Note:  08/08/2016 12:08 PM  Ian [name]  has presented today for surgery, with the diagnosis of Left inguinal hernia.  The various methods of treatment have been discussed with the patient and family. After consideration of risks, benefits and other options for treatment, the patient has consented to    Procedure(s): OPEN LEFT INGUINAL HERNIA REPAIR WITH MESH (Left) INSERTION OF MESH (Left) as a surgical intervention .    The patient's history has been reviewed, patient examined, no change in status, stable for surgery.  I have reviewed the patient's chart and labs.  Questions were answered to the patient's satisfaction.    Earnstine Regal, MD, [facility] Office: [place]

## 2016-08-08 NOTE — Anesthesia Preprocedure Evaluation (Signed)
Anesthesia Evaluation  Patient identified by MRN, date of birth, ID band Patient awake    Reviewed: Allergy & Precautions, NPO status , Patient's Chart, lab work & pertinent test results  Airway Mallampati: II  TM Distance: >3 FB Neck ROM: Full    Dental no notable dental hx. (+) Teeth Intact   Pulmonary neg pulmonary ROS,  Bilateral pulmonary nodules   Pulmonary exam normal breath sounds clear to auscultation       Cardiovascular hypertension, Pt. on medications + CAD, + Cardiac Stents and + Peripheral Vascular Disease  Normal cardiovascular exam+ dysrhythmias Atrial Fibrillation  Rhythm:Regular Rate:Bradycardia  A Fib 2011   Neuro/Psych  Headaches, negative psych ROS   GI/Hepatic GERD  Medicated and Controlled,Diverticulosis   Endo/Other  Hypercholesterolemia  Renal/GU Renal diseaseHx/o renal calculi     Musculoskeletal  (+) Arthritis , Left inguinal hernia S/P Cervical fusion    Abdominal   Peds  Hematology Hx/o Thrombocytopenia   Anesthesia Other Findings   Reproductive/Obstetrics                               Chemistry      Component Value Date/Time   NA 141 07/30/2016 0917   K 4.5 07/30/2016 0917   CL 106 07/30/2016 0917   CO2 30 07/30/2016 0917   BUN 17 07/30/2016 0917   CREATININE 0.87 07/30/2016 0917      Component Value Date/Time   CALCIUM 9.4 07/30/2016 0917   ALKPHOS 55 07/14/2009 1117   AST 26 07/14/2009 1117   ALT 23 07/14/2009 1117   BILITOT 0.8 07/14/2009 1117     Lab Results  Component Value Date   WBC 5.4 07/30/2016   HGB 15.1 07/30/2016   HCT 46.5 07/30/2016   MCV 89.3 07/30/2016   PLT 155 07/30/2016   EKG: sinus bradycardia, 1st deg AV Block.  ANGIOGRAPHIC DATA: - Left main: No angiographically significant disease. Branches into LAD and circumflex. - Left anterior descending (LAD): Proximal calcification noted. There is minor stenosis, 30% in  the proximal LAD. The previously placed diagonal stent is widely patent. Just prior to the stent there is approximately a 30% stenosis. Nonflow limiting. - Circumflex artery (CIRC): 2 obtuse marginal branches, widely patent with no angiographically significant disease. - Right coronary artery (RCA): Large dominant vessel giving rise to posterior descending artery with only minor luminal irregularities. LEFT VENTRICULOGRAM:Left ventricular angiogram was done in the 30 RAO projection and revealed mildly reduced contractile performance with an estimated ejection fraction of 45 %. Prior ejection fraction was 48% on nuclear stress test. IMPRESSIONS: 1. Previously placed diagonal stent is widely patent. Minor stenosis in the proximal LAD, proximal to the stent with no flow limiting disease. Proximal LAD calcification noted. 2. Mildly decreased left ventricular systolic function. LVEDP 15 mmHg. Ejection fraction 45 %.   Anesthesia Physical Anesthesia Plan  ASA: III  Anesthesia Plan: General   Post-op Pain Management:  Regional for Post-op pain   Induction: Intravenous  Airway Management Planned: Oral ETT  Additional Equipment:   Intra-op Plan:   Post-operative Plan: Extubation in OR  Informed Consent: I have reviewed the patients History and Physical, chart, labs and discussed the procedure including the risks, benefits and alternatives for the proposed anesthesia with the patient or authorized representative who has indicated his/her understanding and acceptance.   Dental advisory given  Plan Discussed with: CRNA, Anesthesiologist and Surgeon  Anesthesia Plan Comments:  Anesthesia Quick Evaluation  

## 2016-08-08 NOTE — Anesthesia Postprocedure Evaluation (Signed)
Anesthesia Post Note  Patient: Ian [name]  Procedure(s) Performed: Procedure(s) (LRB): OPEN LEFT INGUINAL HERNIA REPAIR WITH MESH (Left) INSERTION OF MESH (Left)  Patient location during evaluation: PACU Anesthesia Type: General Level of consciousness: awake and alert and oriented Pain management: pain level controlled Vital Signs Assessment: post-procedure vital signs reviewed and stable Respiratory status: spontaneous breathing, nonlabored ventilation and respiratory function stable Cardiovascular status: blood pressure returned to baseline and stable Postop Assessment: no signs of nausea or vomiting Anesthetic complications: no       Last Vitals:  Vitals:   08/08/16 1445 08/08/16 1506  BP: (!) 143/82 (!) 151/91  Pulse: 71 65  Resp: 13 16  Temp: 36.4 C 36.5 C    Last Pain:  Vitals:   08/08/16 1515  TempSrc:   PainSc: 3                  Ryanne Morand A.

## 2016-08-08 NOTE — Transfer of Care (Signed)
Immediate Anesthesia Transfer of Care Note  Patient: Ian [name]  Procedure(s) Performed: Procedure(s): OPEN LEFT INGUINAL HERNIA REPAIR WITH MESH (Left) INSERTION OF MESH (Left)  Patient Location: PACU  Anesthesia Type:General  Level of Consciousness: sedated  Airway & Oxygen Therapy: Patient Spontanous Breathing and Patient connected to face mask oxygen  Post-op Assessment: Report given to RN and Post -op Vital signs reviewed and stable  Post vital signs: Reviewed and stable  Last Vitals:  Vitals:   08/08/16 1205 08/08/16 1210  BP: 118/69 (!) 128/58  Pulse: (!) 59 62  Resp: 14 14  Temp:      Last Pain:  Vitals:   08/08/16 1129  TempSrc:   PainSc: 0-No pain      Patients Stated Pain Goal: 3 (86/57/84 6962)  Complications: No apparent anesthesia complications

## 2016-08-08 NOTE — Progress Notes (Signed)
Assisted Dr. Foster with left, ultrasound guided, transabdominal plane block. Side rails up, monitors on throughout procedure. See vital signs in flow sheet. Tolerated Procedure well. 

## 2016-08-09 ENCOUNTER — Encounter (HOSPITAL_COMMUNITY): Payer: Self-pay | Admitting: Surgery

## 2016-08-09 NOTE — Progress Notes (Signed)
Patient called this nurse on the floor about 9 this am . Stated he was having 10/10 pain in left groin right at bend of leg. Hot poker pain that only happens when he is up. Desires to speak with anesthesiologist about the block he got pre-op. I called Dr. Lissa Hoard who stated he would call patient.

## 2016-08-09 NOTE — Progress Notes (Signed)
Called patient to inform him I notified anesthesiology and Dr. Dr. Lissa Hoard is to call him.

## 2016-08-09 NOTE — Addendum Note (Signed)
Addendum  created 08/09/16 5945 by Nolon Nations, MD   Sign clinical note

## 2016-08-09 NOTE — Progress Notes (Signed)
Post op telephone call Pt called to complain about groin pain with ambulation. Denies extremity weakness or swelling at surgery site. States incision "feels fine." I discussed that the block only would cover the skin sensation and that he is likely describing something surgery related. I recommended that he call Dr. Harlow Asa in regard to this pain for further workup if necessary.

## 2016-08-10 ENCOUNTER — Telehealth: Payer: Self-pay | Admitting: Surgery

## 2016-08-10 NOTE — Telephone Encounter (Signed)
Mr. Cullipher had an open open left inguinal hernia on 4/26.  He called because of unusual amount of left groin pain that goes down his anterior thigh.  He has had multiple operations, but this is the worst pain he has experienced.  He is taking pain meds, using ice, and trying to ambulate, but without much benefit.  He is having no trouble with his abdomen or the site of the incision.  He'll check with our office on Monday.  Alphonsa Overall, MD, [facility] Pager: [phone] Office phone:  [phone]

## 2016-10-08 ENCOUNTER — Telehealth: Payer: Self-pay | Admitting: *Deleted

## 2016-10-08 NOTE — Telephone Encounter (Signed)
FAXED SIGNED CLEARANCE FORM TO [place] RN

## 2016-10-11 ENCOUNTER — Other Ambulatory Visit: Payer: Self-pay | Admitting: Orthopedic Surgery

## 2016-10-11 DIAGNOSIS — G5601 Carpal tunnel syndrome, right upper limb: Secondary | ICD-10-CM | POA: Diagnosis not present

## 2016-10-11 DIAGNOSIS — M19041 Primary osteoarthritis, right hand: Secondary | ICD-10-CM | POA: Diagnosis not present

## 2016-10-22 DIAGNOSIS — N200 Calculus of kidney: Secondary | ICD-10-CM | POA: Diagnosis not present

## 2016-10-22 DIAGNOSIS — R310 Gross hematuria: Secondary | ICD-10-CM | POA: Diagnosis not present

## 2016-10-22 DIAGNOSIS — D3001 Benign neoplasm of right kidney: Secondary | ICD-10-CM | POA: Diagnosis not present

## 2016-11-07 DIAGNOSIS — H35372 Puckering of macula, left eye: Secondary | ICD-10-CM | POA: Diagnosis not present

## 2016-11-24 DIAGNOSIS — Z23 Encounter for immunization: Secondary | ICD-10-CM | POA: Diagnosis not present

## 2016-11-29 DIAGNOSIS — H31002 Unspecified chorioretinal scars, left eye: Secondary | ICD-10-CM | POA: Diagnosis not present

## 2016-11-29 DIAGNOSIS — H43813 Vitreous degeneration, bilateral: Secondary | ICD-10-CM | POA: Diagnosis not present

## 2016-11-29 DIAGNOSIS — H2513 Age-related nuclear cataract, bilateral: Secondary | ICD-10-CM | POA: Diagnosis not present

## 2016-12-06 ENCOUNTER — Encounter (INDEPENDENT_AMBULATORY_CARE_PROVIDER_SITE_OTHER): Payer: Medicare Other | Admitting: Ophthalmology

## 2016-12-06 DIAGNOSIS — H2513 Age-related nuclear cataract, bilateral: Secondary | ICD-10-CM

## 2016-12-06 DIAGNOSIS — H43813 Vitreous degeneration, bilateral: Secondary | ICD-10-CM | POA: Diagnosis not present

## 2016-12-06 DIAGNOSIS — H35033 Hypertensive retinopathy, bilateral: Secondary | ICD-10-CM

## 2016-12-06 DIAGNOSIS — D3132 Benign neoplasm of left choroid: Secondary | ICD-10-CM | POA: Diagnosis not present

## 2016-12-06 DIAGNOSIS — I10 Essential (primary) hypertension: Secondary | ICD-10-CM

## 2017-01-15 DIAGNOSIS — L821 Other seborrheic keratosis: Secondary | ICD-10-CM | POA: Diagnosis not present

## 2017-01-15 DIAGNOSIS — L570 Actinic keratosis: Secondary | ICD-10-CM | POA: Diagnosis not present

## 2017-01-15 DIAGNOSIS — Z85828 Personal history of other malignant neoplasm of skin: Secondary | ICD-10-CM | POA: Diagnosis not present

## 2017-03-31 DIAGNOSIS — Z85820 Personal history of malignant melanoma of skin: Secondary | ICD-10-CM | POA: Diagnosis not present

## 2017-03-31 DIAGNOSIS — L570 Actinic keratosis: Secondary | ICD-10-CM | POA: Diagnosis not present

## 2017-03-31 DIAGNOSIS — D1801 Hemangioma of skin and subcutaneous tissue: Secondary | ICD-10-CM | POA: Diagnosis not present

## 2017-03-31 DIAGNOSIS — L814 Other melanin hyperpigmentation: Secondary | ICD-10-CM | POA: Diagnosis not present

## 2017-03-31 DIAGNOSIS — Z85828 Personal history of other malignant neoplasm of skin: Secondary | ICD-10-CM | POA: Diagnosis not present

## 2017-03-31 DIAGNOSIS — M7130 Other bursal cyst, unspecified site: Secondary | ICD-10-CM | POA: Diagnosis not present

## 2017-04-29 DIAGNOSIS — B009 Herpesviral infection, unspecified: Secondary | ICD-10-CM | POA: Diagnosis not present

## 2017-04-29 DIAGNOSIS — L738 Other specified follicular disorders: Secondary | ICD-10-CM | POA: Diagnosis not present

## 2017-04-29 DIAGNOSIS — Z85828 Personal history of other malignant neoplasm of skin: Secondary | ICD-10-CM | POA: Diagnosis not present

## 2017-04-29 DIAGNOSIS — L300 Nummular dermatitis: Secondary | ICD-10-CM | POA: Diagnosis not present

## 2017-04-29 DIAGNOSIS — M7130 Other bursal cyst, unspecified site: Secondary | ICD-10-CM | POA: Diagnosis not present

## 2017-07-10 DIAGNOSIS — M48062 Spinal stenosis, lumbar region with neurogenic claudication: Secondary | ICD-10-CM | POA: Diagnosis not present

## 2017-07-10 DIAGNOSIS — M4316 Spondylolisthesis, lumbar region: Secondary | ICD-10-CM | POA: Diagnosis not present

## 2017-07-15 ENCOUNTER — Other Ambulatory Visit: Payer: Self-pay | Admitting: Nurse Practitioner

## 2017-07-15 DIAGNOSIS — M48062 Spinal stenosis, lumbar region with neurogenic claudication: Secondary | ICD-10-CM

## 2017-07-17 ENCOUNTER — Other Ambulatory Visit: Payer: Self-pay | Admitting: Nurse Practitioner

## 2017-07-17 ENCOUNTER — Ambulatory Visit
Admission: RE | Admit: 2017-07-17 | Discharge: 2017-07-17 | Disposition: A | Discharge status: Home / Self Care | Payer: Medicare Other | Source: Ambulatory Visit | Attending: Nurse Practitioner | Admitting: Nurse Practitioner

## 2017-07-17 DIAGNOSIS — M48061 Spinal stenosis, lumbar region without neurogenic claudication: Secondary | ICD-10-CM | POA: Diagnosis not present

## 2017-07-17 DIAGNOSIS — M48062 Spinal stenosis, lumbar region with neurogenic claudication: Secondary | ICD-10-CM

## 2017-07-17 DIAGNOSIS — M5416 Radiculopathy, lumbar region: Secondary | ICD-10-CM

## 2017-07-20 ENCOUNTER — Other Ambulatory Visit: Payer: BLUE CROSS/BLUE SHIELD

## 2017-08-06 DIAGNOSIS — I700 Atherosclerosis of aorta: Secondary | ICD-10-CM | POA: Diagnosis not present

## 2017-08-06 DIAGNOSIS — Z79899 Other long term (current) drug therapy: Secondary | ICD-10-CM | POA: Diagnosis not present

## 2017-08-06 DIAGNOSIS — M5441 Lumbago with sciatica, right side: Secondary | ICD-10-CM | POA: Diagnosis not present

## 2017-08-06 DIAGNOSIS — G720 Drug-induced myopathy: Secondary | ICD-10-CM | POA: Diagnosis not present

## 2017-08-06 DIAGNOSIS — I10 Essential (primary) hypertension: Secondary | ICD-10-CM | POA: Diagnosis not present

## 2017-08-06 DIAGNOSIS — M5442 Lumbago with sciatica, left side: Secondary | ICD-10-CM | POA: Diagnosis not present

## 2017-08-06 DIAGNOSIS — G8929 Other chronic pain: Secondary | ICD-10-CM | POA: Diagnosis not present

## 2017-08-06 DIAGNOSIS — M4316 Spondylolisthesis, lumbar region: Secondary | ICD-10-CM | POA: Diagnosis not present

## 2017-08-06 DIAGNOSIS — I2510 Atherosclerotic heart disease of native coronary artery without angina pectoris: Secondary | ICD-10-CM | POA: Diagnosis not present

## 2017-08-06 DIAGNOSIS — M5136 Other intervertebral disc degeneration, lumbar region: Secondary | ICD-10-CM | POA: Diagnosis not present

## 2017-08-06 DIAGNOSIS — M48062 Spinal stenosis, lumbar region with neurogenic claudication: Secondary | ICD-10-CM | POA: Diagnosis not present

## 2017-08-18 DIAGNOSIS — M25511 Pain in right shoulder: Secondary | ICD-10-CM | POA: Diagnosis not present

## 2017-08-18 DIAGNOSIS — M19111 Post-traumatic osteoarthritis, right shoulder: Secondary | ICD-10-CM | POA: Diagnosis not present

## 2017-08-27 DIAGNOSIS — Z79899 Other long term (current) drug therapy: Secondary | ICD-10-CM | POA: Diagnosis not present

## 2017-08-27 DIAGNOSIS — I2510 Atherosclerotic heart disease of native coronary artery without angina pectoris: Secondary | ICD-10-CM | POA: Diagnosis not present

## 2017-08-27 DIAGNOSIS — Z7982 Long term (current) use of aspirin: Secondary | ICD-10-CM | POA: Diagnosis not present

## 2017-08-27 DIAGNOSIS — Z01818 Encounter for other preprocedural examination: Secondary | ICD-10-CM | POA: Diagnosis not present

## 2017-08-27 DIAGNOSIS — I10 Essential (primary) hypertension: Secondary | ICD-10-CM | POA: Diagnosis not present

## 2017-08-27 DIAGNOSIS — M4316 Spondylolisthesis, lumbar region: Secondary | ICD-10-CM | POA: Diagnosis not present

## 2017-09-04 DIAGNOSIS — R05 Cough: Secondary | ICD-10-CM | POA: Diagnosis not present

## 2017-09-05 ENCOUNTER — Other Ambulatory Visit: Payer: Self-pay | Admitting: Internal Medicine

## 2017-09-05 ENCOUNTER — Ambulatory Visit
Admission: RE | Admit: 2017-09-05 | Discharge: 2017-09-05 | Disposition: A | Discharge status: Home / Self Care | Payer: Medicare Other | Source: Ambulatory Visit | Attending: Internal Medicine | Admitting: Internal Medicine

## 2017-09-05 DIAGNOSIS — R05 Cough: Secondary | ICD-10-CM

## 2017-09-05 DIAGNOSIS — R059 Cough, unspecified: Secondary | ICD-10-CM

## 2017-09-19 DIAGNOSIS — M25511 Pain in right shoulder: Secondary | ICD-10-CM | POA: Diagnosis not present

## 2017-09-19 DIAGNOSIS — M75121 Complete rotator cuff tear or rupture of right shoulder, not specified as traumatic: Secondary | ICD-10-CM | POA: Diagnosis not present

## 2017-09-25 DIAGNOSIS — M4306 Spondylolysis, lumbar region: Secondary | ICD-10-CM | POA: Diagnosis not present

## 2017-09-25 DIAGNOSIS — I10 Essential (primary) hypertension: Secondary | ICD-10-CM | POA: Diagnosis present

## 2017-09-25 DIAGNOSIS — M47816 Spondylosis without myelopathy or radiculopathy, lumbar region: Secondary | ICD-10-CM | POA: Diagnosis not present

## 2017-09-25 DIAGNOSIS — Z880 Allergy status to penicillin: Secondary | ICD-10-CM | POA: Diagnosis not present

## 2017-09-25 DIAGNOSIS — M4326 Fusion of spine, lumbar region: Secondary | ICD-10-CM | POA: Diagnosis not present

## 2017-09-25 DIAGNOSIS — I2510 Atherosclerotic heart disease of native coronary artery without angina pectoris: Secondary | ICD-10-CM | POA: Diagnosis present

## 2017-09-25 DIAGNOSIS — M1990 Unspecified osteoarthritis, unspecified site: Secondary | ICD-10-CM | POA: Diagnosis present

## 2017-09-25 DIAGNOSIS — M48062 Spinal stenosis, lumbar region with neurogenic claudication: Secondary | ICD-10-CM | POA: Diagnosis present

## 2017-09-25 DIAGNOSIS — M48061 Spinal stenosis, lumbar region without neurogenic claudication: Secondary | ICD-10-CM | POA: Diagnosis not present

## 2017-09-25 DIAGNOSIS — Z981 Arthrodesis status: Secondary | ICD-10-CM | POA: Diagnosis not present

## 2017-09-25 DIAGNOSIS — M4316 Spondylolisthesis, lumbar region: Secondary | ICD-10-CM | POA: Diagnosis not present

## 2017-09-25 DIAGNOSIS — Z951 Presence of aortocoronary bypass graft: Secondary | ICD-10-CM | POA: Diagnosis not present

## 2017-09-25 DIAGNOSIS — M4324 Fusion of spine, thoracic region: Secondary | ICD-10-CM | POA: Diagnosis not present

## 2017-09-25 DIAGNOSIS — M4715 Other spondylosis with myelopathy, thoracolumbar region: Secondary | ICD-10-CM | POA: Diagnosis present

## 2017-09-25 DIAGNOSIS — K219 Gastro-esophageal reflux disease without esophagitis: Secondary | ICD-10-CM | POA: Diagnosis present

## 2017-09-25 DIAGNOSIS — Z7982 Long term (current) use of aspirin: Secondary | ICD-10-CM | POA: Diagnosis not present

## 2017-09-25 DIAGNOSIS — E875 Hyperkalemia: Secondary | ICD-10-CM | POA: Diagnosis not present

## 2017-09-25 DIAGNOSIS — M4726 Other spondylosis with radiculopathy, lumbar region: Secondary | ICD-10-CM | POA: Diagnosis not present

## 2017-09-25 DIAGNOSIS — M4716 Other spondylosis with myelopathy, lumbar region: Secondary | ICD-10-CM | POA: Diagnosis not present

## 2017-10-06 DIAGNOSIS — Z981 Arthrodesis status: Secondary | ICD-10-CM | POA: Diagnosis not present

## 2017-10-06 DIAGNOSIS — M4316 Spondylolisthesis, lumbar region: Secondary | ICD-10-CM | POA: Diagnosis not present

## 2017-10-06 DIAGNOSIS — M4326 Fusion of spine, lumbar region: Secondary | ICD-10-CM | POA: Diagnosis not present

## 2017-10-29 DIAGNOSIS — M25512 Pain in left shoulder: Secondary | ICD-10-CM | POA: Diagnosis not present

## 2017-10-29 DIAGNOSIS — M25511 Pain in right shoulder: Secondary | ICD-10-CM | POA: Diagnosis not present

## 2017-11-05 DIAGNOSIS — I82611 Acute embolism and thrombosis of superficial veins of right upper extremity: Secondary | ICD-10-CM | POA: Diagnosis not present

## 2017-11-10 DIAGNOSIS — M545 Low back pain: Secondary | ICD-10-CM | POA: Diagnosis not present

## 2017-11-10 DIAGNOSIS — M6281 Muscle weakness (generalized): Secondary | ICD-10-CM | POA: Diagnosis not present

## 2017-11-12 ENCOUNTER — Other Ambulatory Visit: Payer: Self-pay

## 2017-11-12 ENCOUNTER — Encounter (HOSPITAL_COMMUNITY): Payer: Self-pay | Admitting: *Deleted

## 2017-11-12 DIAGNOSIS — S0502XA Injury of conjunctiva and corneal abrasion without foreign body, left eye, initial encounter: Secondary | ICD-10-CM | POA: Diagnosis not present

## 2017-11-12 MED ORDER — TRANEXAMIC ACID 1000 MG/10ML IV SOLN
1000.0000 mg | INTRAVENOUS | Status: AC
  Administered 2017-11-13: 1000 mg via INTRAVENOUS
  Filled 2017-11-12: qty 1100

## 2017-11-12 NOTE — Progress Notes (Signed)
Anesthesia Chart Review: SAME DAY WORK-UP  Case:  [id number] Date/Time:  11/13/17 1235   Procedure:  REVERSE RIGHT SHOULDER ARTHROPLASTY (Right Shoulder)   Anesthesia type:  General   Pre-op diagnosis:  right shoulder rotator cuff tear arthropathy   Location:  [facility] OR ROOM 06 / [place] [facility]   Surgeon:  Justice Britain, MD      DISCUSSION: Patient is a 72 year old male scheduled for the above procedure.  History includes never smoker, CAD (s/p DIAG1 stent 06/28/02), dysrhythmia (PAF 07/2009), aortic atherosclerosis, bilatateral lung nodules (felt to be benign by f/u CT imaging 11/15/13), hypertension, GERD, thyroid cyst, varicose veins, migraines, hypercholesterolemia, thrombocytopenia (mild) '12, diverticulosis, nephrolithiasis, arthritis, skin cancer, C2-3 posterior fusion '11, L3-S1 fusion 10/28/03, brain surgery (not otherwise specified), left inguinal hernia repair 08/08/16, left L1-3 XLIF 09/25/17 and T10-L2 pedicle screw fixation 09/29/17.  Patient with known CAD. Last known cardiology visit 07/2016; however, just last month he tolerated XLIF followed by multi-level pedicle screw fixation. Nursing staff are still attempting to reach patient to complete preoperative history and teaching. If he remains asymptomatic from a CV standpoint and labs and EKG are stable then I would anticipate that he can proceed as planned.   EKG requested from [facility]--if > 7 year old then he will need an updated EKG on the day of surgery.    VS: He is a same-day work-up, so he will get vitals on the day of surgery.  PROVIDERS: Lajean Manes, MD is PCP Glenetta Hew, MD is cardiologist. Last visit 08/04/16 for preoperative clearance prior to hernia repair. He did not recommend further testing before that surgery. [facility] follow-up was not arranged as patient was planning to move to [place], but apparently this has not occurred yet.   LABS: He is a same day work-up, so he will get labs on the day of surgery.    IMAGES: CXR 09/05/17: IMPRESSION: No active cardiopulmonary disease.   EKG: Last EKG from PCP office was in 2014. Last EKG at [facility] was 07/30/16. Requested EKG from [facility], as available. If no EKG done within the past year, then he will need one on the day of surgery.    CV: Cardiac cath 11/07/11: ANGIOGRAPHIC DATA: - Left main: No angiographically significant disease. Branches into LAD and circumflex. - Left anterior descending (LAD): Proximal calcification noted. There is minor stenosis, 30% in the proximal LAD. The previously placed diagonal stent is widely patent. Just prior to the stent there is approximately a 30% stenosis. Nonflow limiting. - Circumflex artery (CIRC): 2 obtuse marginal branches, widely patent with no angiographically significant disease. - Right coronary artery (RCA): Large dominant vessel giving rise to posterior descending artery with only minor luminal irregularities. LEFT VENTRICULOGRAM:Left ventricular angiogram was done in the 30 RAO projection and revealed mildly reduced contractile performance with an estimated ejection fraction of 45 %. Prior ejection fraction was 48% on nuclear stress test. IMPRESSIONS: 1. Previously placed diagonal stent is widely patent. Minor stenosis in the proximal LAD, proximal to the stent with no flow limiting disease. Proximal LAD calcification noted. 2. Mildly decreased left ventricular systolic function. LVEDP 15 mmHg. Ejection fraction 45 %. RECOMMENDATION:Reassurance, continue with medical management.   Nuclear stress treadmill test 07/03/10 [facility]; scanned under Media tab, Correspondence, 08/08/16): Overall low risk, no areas of significant ischemia. Low normal ejection fraction 48% with generalized hypokinesis.   Past Medical History:  Diagnosis Date  . Arthritis   . Atherosclerosis of aorta ([place])   . CAD S/P  percutaneous coronary angioplasty 06/2002   Cath showed subtotal occlusion of  D1 -- staged PCI with Taxus DES 2.5 x 12; 08/2011: Cath for persistent CP despite normal Myoview -> 30% pLAD & pD1 with patent D1 stent.  EF ~45%.  . Cancer ([place])    skin cancer  . Diverticulosis   . Dysrhythmia   . GERD (gastroesophageal reflux disease)   . History of kidney stones   . HTN (hypertension)   . Hypercholesterolemia   . Lung nodule    rt lower lobe  . Migraine   . Nephrolithiasis   . PAF (paroxysmal atrial fibrillation) ([place]) 2011   seen on EKG  . Thrombocytopenia ([place])   . Thyroid cyst   . Urine findings abnormal    positive protein  . Varicose veins    ejection fraction 48% 06/2010    Past Surgical History:  Procedure Laterality Date  . CERVICAL FUSION  1995; ~2008   Twice  . CORONARY STENT INTERVENTION  06/28/2002   DIAG1 PCI: Taxus DES 2.5 x 12   . diagonal stent     DIAG1 DES 06/28/02  . INGUINAL HERNIA REPAIR Left 08/08/2016   Procedure: OPEN LEFT INGUINAL HERNIA REPAIR WITH MESH;  Surgeon: Armandina Gemma, MD;  Location: [facility] ORS;  Service: General;  Laterality: Left;  . INSERTION OF MESH Left 08/08/2016   Procedure: INSERTION OF MESH;  Surgeon: Armandina Gemma, MD;  Location: [facility] ORS;  Service: General;  Laterality: Left;  . LEFT HEART CATHETERIZATION WITH CORONARY ANGIOGRAM N/A 11/07/2011   Procedure: LEFT HEART CATHETERIZATION WITH CORONARY ANGIOGRAM;  Surgeon: Candee Furbish, MD;  Location: [facility] CATH LAB;  Service: Cardiovascular: - Equivocal persistent symptoms despite nonischemic Myoview. EF ~45%. 30% calcified pLAD, 30% D1 prestent. Widely paent D1 DES stent.  . [place], 2010, 2011   Total 3 surgeries  . TRIGGER FINGER RELEASE      MEDICATIONS: . [START ON 11/13/2017] tranexamic acid (CYKLOKAPRON) 1,000 mg in sodium chloride 0.9 % 100 mL IVPB   . aspirin EC 81 MG tablet  . Calcium Carb-Cholecalciferol (CALCIUM 600+D) 600-800 MG-UNIT TABS  . cetirizine (ZYRTEC) 10 MG tablet  . Coenzyme Q10 (COQ10) 400 MG CAPS  . esomeprazole (NEXIUM) 20 MG  capsule  . folic acid (FOLVITE) 629 MCG tablet  . gabapentin (NEURONTIN) 100 MG capsule  . Glucosamine HCl-MSM (GLUCOSAMINE-MSM PO)  . HYDROcodone-acetaminophen (NORCO/VICODIN) 5-325 MG tablet  . lisinopril (PRINIVIL,ZESTRIL) 20 MG tablet  . methocarbamol (ROBAXIN) 500 MG tablet  . Misc Natural Products (TART CHERRY ADVANCED) CAPS  . Multiple Vitamins-Minerals ([place] 50+) CAPS  . nitroGLYCERIN (NITROSTAT) 0.4 MG SL tablet  . NONFORMULARY OR COMPOUNDED ITEM  . Riboflavin 100 MG TABS  . sildenafil (VIAGRA) 50 MG tablet  . tobramycin-dexamethasone (TOBRADEX) ophthalmic solution  . vitamin B-12 (CYANOCOBALAMIN) 1000 MCG tablet  . vitamin C (ASCORBIC ACID) 500 MG tablet  . XIIDRA 5 % SOLN  . Eyelid Cleansers (AVENOVA) 0.01 % SOLN  . HYDROcodone-ibuprofen (VICOPROFEN) 7.5-200 MG tablet  . naproxen sodium (ANAPROX) 220 MG tablet    George [facility] Short Stay Center/Anesthesiology Phone [phone] 11/12/2017 3:28 PM

## 2017-11-12 NOTE — Progress Notes (Signed)
Spoke with pt for pre-op call. Pt hx of CAD with a stent in 2004. Denies any recent chest pain or sob. Pt's cardiologist is Dr. Glenetta Hew. Pt states Dr. Ellyn Hack has given him clearance for his most recent surgeries. Pt states he's not Diabetic.

## 2017-11-13 ENCOUNTER — Inpatient Hospital Stay (HOSPITAL_COMMUNITY): Payer: Medicare Other | Admitting: Vascular Surgery

## 2017-11-13 ENCOUNTER — Encounter (HOSPITAL_COMMUNITY): Payer: Self-pay | Admitting: *Deleted

## 2017-11-13 ENCOUNTER — Inpatient Hospital Stay (HOSPITAL_COMMUNITY)
Admission: RE | Admit: 2017-11-13 | Discharge: 2017-11-14 | DRG: 483 | Disposition: A | Discharge status: Home / Self Care | Payer: Medicare Other | Source: Ambulatory Visit | Attending: Orthopedic Surgery | Admitting: Orthopedic Surgery

## 2017-11-13 ENCOUNTER — Encounter (HOSPITAL_COMMUNITY)
Admission: RE | Disposition: A | Discharge status: Home / Self Care | Payer: Self-pay | Source: Ambulatory Visit | Attending: Orthopedic Surgery

## 2017-11-13 DIAGNOSIS — I10 Essential (primary) hypertension: Secondary | ICD-10-CM | POA: Diagnosis present

## 2017-11-13 DIAGNOSIS — Z85828 Personal history of other malignant neoplasm of skin: Secondary | ICD-10-CM | POA: Diagnosis not present

## 2017-11-13 DIAGNOSIS — Z955 Presence of coronary angioplasty implant and graft: Secondary | ICD-10-CM | POA: Diagnosis not present

## 2017-11-13 DIAGNOSIS — Z981 Arthrodesis status: Secondary | ICD-10-CM | POA: Diagnosis not present

## 2017-11-13 DIAGNOSIS — Z7982 Long term (current) use of aspirin: Secondary | ICD-10-CM

## 2017-11-13 DIAGNOSIS — I700 Atherosclerosis of aorta: Secondary | ICD-10-CM | POA: Diagnosis present

## 2017-11-13 DIAGNOSIS — Z880 Allergy status to penicillin: Secondary | ICD-10-CM | POA: Diagnosis not present

## 2017-11-13 DIAGNOSIS — K219 Gastro-esophageal reflux disease without esophagitis: Secondary | ICD-10-CM | POA: Diagnosis present

## 2017-11-13 DIAGNOSIS — I739 Peripheral vascular disease, unspecified: Secondary | ICD-10-CM | POA: Diagnosis not present

## 2017-11-13 DIAGNOSIS — M75101 Unspecified rotator cuff tear or rupture of right shoulder, not specified as traumatic: Principal | ICD-10-CM | POA: Diagnosis present

## 2017-11-13 DIAGNOSIS — I480 Paroxysmal atrial fibrillation: Secondary | ICD-10-CM | POA: Diagnosis present

## 2017-11-13 DIAGNOSIS — Z885 Allergy status to narcotic agent status: Secondary | ICD-10-CM

## 2017-11-13 DIAGNOSIS — Z888 Allergy status to other drugs, medicaments and biological substances status: Secondary | ICD-10-CM

## 2017-11-13 DIAGNOSIS — I2510 Atherosclerotic heart disease of native coronary artery without angina pectoris: Secondary | ICD-10-CM | POA: Diagnosis present

## 2017-11-13 DIAGNOSIS — E7800 Pure hypercholesterolemia, unspecified: Secondary | ICD-10-CM | POA: Diagnosis present

## 2017-11-13 DIAGNOSIS — G8918 Other acute postprocedural pain: Secondary | ICD-10-CM | POA: Diagnosis not present

## 2017-11-13 DIAGNOSIS — Z79899 Other long term (current) drug therapy: Secondary | ICD-10-CM | POA: Diagnosis not present

## 2017-11-13 DIAGNOSIS — M12811 Other specific arthropathies, not elsewhere classified, right shoulder: Secondary | ICD-10-CM | POA: Diagnosis not present

## 2017-11-13 DIAGNOSIS — Z96619 Presence of unspecified artificial shoulder joint: Secondary | ICD-10-CM

## 2017-11-13 HISTORY — PX: REVERSE SHOULDER ARTHROPLASTY: SHX5054

## 2017-11-13 LAB — BASIC METABOLIC PANEL
Anion gap: 13 (ref 5–15)
BUN: 18 mg/dL (ref 8–23)
CALCIUM: 9.2 mg/dL (ref 8.9–10.3)
CO2: 26 mmol/L (ref 22–32)
Chloride: 104 mmol/L (ref 98–111)
Creatinine, Ser: 0.87 mg/dL (ref 0.61–1.24)
GFR calc Af Amer: >60 mL/min (ref >60)
Glucose, Bld: 77 mg/dL (ref 70–99)
Potassium: 4.1 mmol/L (ref 3.5–5.1)
Sodium: 143 mmol/L (ref 135–145)

## 2017-11-13 LAB — CBC
HCT: 43.5 % (ref 39.0–52.0)
Hemoglobin: 13.3 g/dL (ref 13.0–17.0)
MCH: 28.7 pg (ref 26.0–34.0)
MCHC: 30.6 g/dL (ref 30.0–36.0)
MCV: 94 fL (ref 78.0–100.0)
Platelets: 231 K/uL (ref 150–400)
RBC: 4.63 MIL/uL (ref 4.22–5.81)
RDW: 12.5 % (ref 11.5–15.5)
WBC: 8.9 K/uL (ref 4.0–10.5)

## 2017-11-13 SURGERY — ARTHROPLASTY, SHOULDER, TOTAL, REVERSE
Anesthesia: General | Site: Shoulder | Laterality: Right

## 2017-11-13 MED ORDER — NITROGLYCERIN 0.4 MG SL SUBL: 0.4000 mg | SUBLINGUAL_TABLET | SUBLINGUAL | Status: DC | PRN

## 2017-11-13 MED ORDER — ROCURONIUM BROMIDE 100 MG/10ML IV SOLN
INTRAVENOUS | Status: DC | PRN
  Administered 2017-11-13: 50 mg via INTRAVENOUS

## 2017-11-13 MED ORDER — MIDAZOLAM HCL 2 MG/2ML IJ SOLN
INTRAMUSCULAR | Status: AC
  Administered 2017-11-13: 1 mg via INTRAVENOUS
  Filled 2017-11-13: qty 2

## 2017-11-13 MED ORDER — PHENYLEPHRINE 40 MCG/ML (10ML) SYRINGE FOR IV PUSH (FOR BLOOD PRESSURE SUPPORT)
PREFILLED_SYRINGE | INTRAVENOUS | Status: DC | PRN
  Administered 2017-11-13: 80 ug via INTRAVENOUS

## 2017-11-13 MED ORDER — ONDANSETRON HCL 4 MG/2ML IJ SOLN
INTRAMUSCULAR | Status: DC | PRN
  Administered 2017-11-13: 4 mg via INTRAVENOUS

## 2017-11-13 MED ORDER — GABAPENTIN 100 MG PO CAPS
200.0000 mg | ORAL_CAPSULE | Freq: Four times a day (QID) | ORAL | Status: DC
  Administered 2017-11-13 – 2017-11-14 (×2): 200 mg via ORAL
  Filled 2017-11-13 (×3): qty 2

## 2017-11-13 MED ORDER — METOCLOPRAMIDE HCL 5 MG/ML IJ SOLN: 5.0000 mg | Freq: Three times a day (TID) | INTRAMUSCULAR | Status: DC | PRN

## 2017-11-13 MED ORDER — KETOROLAC TROMETHAMINE 15 MG/ML IJ SOLN
7.5000 mg | Freq: Four times a day (QID) | INTRAMUSCULAR | Status: DC
  Administered 2017-11-14: 7.5 mg via INTRAVENOUS
  Filled 2017-11-13 (×2): qty 1

## 2017-11-13 MED ORDER — ONDANSETRON HCL 4 MG/2ML IJ SOLN
INTRAMUSCULAR | Status: AC
  Filled 2017-11-13: qty 2

## 2017-11-13 MED ORDER — LACTATED RINGERS IV SOLN
INTRAVENOUS | Status: DC
  Administered 2017-11-13: 18:00:00 via INTRAVENOUS

## 2017-11-13 MED ORDER — ALUM & MAG HYDROXIDE-SIMETH 200-200-20 MG/5ML PO SUSP: 30.0000 mL | ORAL | Status: DC | PRN

## 2017-11-13 MED ORDER — FENTANYL CITRATE (PF) 100 MCG/2ML IJ SOLN
INTRAMUSCULAR | Status: AC
  Administered 2017-11-13: 50 ug via INTRAVENOUS
  Filled 2017-11-13: qty 2

## 2017-11-13 MED ORDER — PHENOL 1.4 % MT LIQD: 1.0000 | OROMUCOSAL | Status: DC | PRN

## 2017-11-13 MED ORDER — METHOCARBAMOL 1000 MG/10ML IJ SOLN
500.0000 mg | Freq: Four times a day (QID) | INTRAVENOUS | Status: DC | PRN
  Filled 2017-11-13: qty 5

## 2017-11-13 MED ORDER — DOCUSATE SODIUM 100 MG PO CAPS
100.0000 mg | ORAL_CAPSULE | Freq: Two times a day (BID) | ORAL | Status: DC
  Administered 2017-11-13 – 2017-11-14 (×2): 100 mg via ORAL
  Filled 2017-11-13 (×2): qty 1

## 2017-11-13 MED ORDER — METHOCARBAMOL 500 MG PO TABS
500.0000 mg | ORAL_TABLET | Freq: Four times a day (QID) | ORAL | Status: DC | PRN
  Administered 2017-11-13: 500 mg via ORAL
  Filled 2017-11-13: qty 1

## 2017-11-13 MED ORDER — SUGAMMADEX SODIUM 200 MG/2ML IV SOLN
INTRAVENOUS | Status: DC | PRN
  Administered 2017-11-13: 200 mg via INTRAVENOUS

## 2017-11-13 MED ORDER — CEFAZOLIN SODIUM-DEXTROSE 2-4 GM/100ML-% IV SOLN
INTRAVENOUS | Status: AC
  Filled 2017-11-13: qty 100

## 2017-11-13 MED ORDER — LIDOCAINE 2% (20 MG/ML) 5 ML SYRINGE
INTRAMUSCULAR | Status: AC
  Filled 2017-11-13: qty 5

## 2017-11-13 MED ORDER — POLYETHYLENE GLYCOL 3350 17 G PO PACK: 17.0000 g | PACK | Freq: Every day | ORAL | Status: DC | PRN

## 2017-11-13 MED ORDER — ACETAMINOPHEN 325 MG PO TABS: 325.0000 mg | ORAL_TABLET | Freq: Four times a day (QID) | ORAL | Status: DC | PRN

## 2017-11-13 MED ORDER — BUPIVACAINE LIPOSOME 1.3 % IJ SUSP
INTRAMUSCULAR | Status: DC | PRN
  Administered 2017-11-13: 10 mL via PERINEURAL

## 2017-11-13 MED ORDER — HYDROMORPHONE HCL 1 MG/ML IJ SOLN
0.5000 mg | INTRAMUSCULAR | Status: DC | PRN
  Administered 2017-11-14: 1 mg via INTRAVENOUS
  Filled 2017-11-13: qty 1

## 2017-11-13 MED ORDER — CEFAZOLIN SODIUM-DEXTROSE 2-4 GM/100ML-% IV SOLN
2.0000 g | INTRAVENOUS | Status: AC
  Administered 2017-11-13: 2 g via INTRAVENOUS

## 2017-11-13 MED ORDER — METOCLOPRAMIDE HCL 5 MG PO TABS: 5.0000 mg | ORAL_TABLET | Freq: Three times a day (TID) | ORAL | Status: DC | PRN

## 2017-11-13 MED ORDER — PROPOFOL 10 MG/ML IV BOLUS
INTRAVENOUS | Status: AC
  Filled 2017-11-13: qty 20

## 2017-11-13 MED ORDER — HYDROMORPHONE HCL 1 MG/ML IJ SOLN: 0.2500 mg | INTRAMUSCULAR | Status: DC | PRN

## 2017-11-13 MED ORDER — ASPIRIN EC 81 MG PO TBEC
81.0000 mg | DELAYED_RELEASE_TABLET | Freq: Every day | ORAL | Status: DC
  Administered 2017-11-13: 81 mg via ORAL
  Filled 2017-11-13: qty 1

## 2017-11-13 MED ORDER — BISACODYL 5 MG PO TBEC: 5.0000 mg | DELAYED_RELEASE_TABLET | Freq: Every day | ORAL | Status: DC | PRN

## 2017-11-13 MED ORDER — ONDANSETRON HCL 4 MG PO TABS
4.0000 mg | ORAL_TABLET | Freq: Four times a day (QID) | ORAL | Status: DC | PRN
  Administered 2017-11-14: 4 mg via ORAL
  Filled 2017-11-13: qty 1

## 2017-11-13 MED ORDER — FENTANYL CITRATE (PF) 100 MCG/2ML IJ SOLN
50.0000 ug | Freq: Once | INTRAMUSCULAR | Status: AC
  Administered 2017-11-13: 50 ug via INTRAVENOUS

## 2017-11-13 MED ORDER — HYDROCODONE-ACETAMINOPHEN 5-325 MG PO TABS
1.0000 | ORAL_TABLET | ORAL | Status: DC | PRN
  Administered 2017-11-14 (×2): 2 via ORAL
  Filled 2017-11-13 (×2): qty 2

## 2017-11-13 MED ORDER — ONDANSETRON HCL 4 MG/2ML IJ SOLN: 4.0000 mg | Freq: Once | INTRAMUSCULAR | Status: DC | PRN

## 2017-11-13 MED ORDER — MENTHOL 3 MG MT LOZG
1.0000 | LOZENGE | OROMUCOSAL | Status: DC | PRN
  Filled 2017-11-13: qty 9

## 2017-11-13 MED ORDER — DIPHENHYDRAMINE HCL 12.5 MG/5ML PO ELIX: 12.5000 mg | ORAL_SOLUTION | ORAL | Status: DC | PRN

## 2017-11-13 MED ORDER — LIDOCAINE 2% (20 MG/ML) 5 ML SYRINGE
INTRAMUSCULAR | Status: DC | PRN
  Administered 2017-11-13: 100 mg via INTRAVENOUS

## 2017-11-13 MED ORDER — PROPOFOL 10 MG/ML IV BOLUS
INTRAVENOUS | Status: DC | PRN
  Administered 2017-11-13: 150 mg via INTRAVENOUS

## 2017-11-13 MED ORDER — ROCURONIUM BROMIDE 10 MG/ML (PF) SYRINGE
PREFILLED_SYRINGE | INTRAVENOUS | Status: AC
  Filled 2017-11-13: qty 10

## 2017-11-13 MED ORDER — CHLORHEXIDINE GLUCONATE 4 % EX LIQD: 60.0000 mL | Freq: Once | CUTANEOUS | Status: DC

## 2017-11-13 MED ORDER — PANTOPRAZOLE SODIUM 40 MG PO TBEC
40.0000 mg | DELAYED_RELEASE_TABLET | Freq: Every day | ORAL | Status: DC
  Administered 2017-11-13 – 2017-11-14 (×2): 40 mg via ORAL
  Filled 2017-11-13 (×2): qty 1

## 2017-11-13 MED ORDER — FENTANYL CITRATE (PF) 100 MCG/2ML IJ SOLN
INTRAMUSCULAR | Status: DC | PRN
  Administered 2017-11-13: 150 ug via INTRAVENOUS

## 2017-11-13 MED ORDER — ONDANSETRON HCL 4 MG/2ML IJ SOLN: 4.0000 mg | Freq: Four times a day (QID) | INTRAMUSCULAR | Status: DC | PRN

## 2017-11-13 MED ORDER — FENTANYL CITRATE (PF) 250 MCG/5ML IJ SOLN
INTRAMUSCULAR | Status: AC
  Filled 2017-11-13: qty 5

## 2017-11-13 MED ORDER — LISINOPRIL 20 MG PO TABS
20.0000 mg | ORAL_TABLET | Freq: Every day | ORAL | Status: DC
  Administered 2017-11-13: 20 mg via ORAL
  Filled 2017-11-13: qty 1
  Filled 2017-11-13: qty 2

## 2017-11-13 MED ORDER — PHENYLEPHRINE HCL 10 MG/ML IJ SOLN
INTRAMUSCULAR | Status: DC | PRN
  Administered 2017-11-13: 40 ug/min via INTRAVENOUS

## 2017-11-13 MED ORDER — DEXAMETHASONE SODIUM PHOSPHATE 4 MG/ML IJ SOLN
INTRAMUSCULAR | Status: DC | PRN
  Administered 2017-11-13: 5 mg via INTRAVENOUS

## 2017-11-13 MED ORDER — MIDAZOLAM HCL 2 MG/2ML IJ SOLN
INTRAMUSCULAR | Status: AC
  Filled 2017-11-13: qty 2

## 2017-11-13 MED ORDER — 0.9 % SODIUM CHLORIDE (POUR BTL) OPTIME
TOPICAL | Status: DC | PRN
  Administered 2017-11-13: 1000 mL

## 2017-11-13 MED ORDER — BUPIVACAINE-EPINEPHRINE (PF) 0.5% -1:200000 IJ SOLN
INTRAMUSCULAR | Status: DC | PRN
  Administered 2017-11-13: 15 mL via PERINEURAL

## 2017-11-13 MED ORDER — SUGAMMADEX SODIUM 200 MG/2ML IV SOLN
INTRAVENOUS | Status: AC
  Filled 2017-11-13: qty 2

## 2017-11-13 MED ORDER — PHENYLEPHRINE 40 MCG/ML (10ML) SYRINGE FOR IV PUSH (FOR BLOOD PRESSURE SUPPORT)
PREFILLED_SYRINGE | INTRAVENOUS | Status: AC
  Filled 2017-11-13: qty 10

## 2017-11-13 MED ORDER — MIDAZOLAM HCL 2 MG/2ML IJ SOLN
1.0000 mg | Freq: Once | INTRAMUSCULAR | Status: AC
  Administered 2017-11-13: 1 mg via INTRAVENOUS

## 2017-11-13 MED ORDER — HYDROCODONE-ACETAMINOPHEN 7.5-325 MG PO TABS
1.0000 | ORAL_TABLET | ORAL | Status: DC | PRN
  Administered 2017-11-13: 1 via ORAL
  Filled 2017-11-13: qty 2
  Filled 2017-11-13: qty 1

## 2017-11-13 MED ORDER — DEXAMETHASONE SODIUM PHOSPHATE 10 MG/ML IJ SOLN
INTRAMUSCULAR | Status: AC
  Filled 2017-11-13: qty 1

## 2017-11-13 MED ORDER — MAGNESIUM CITRATE PO SOLN: 1.0000 | Freq: Once | ORAL | Status: DC | PRN

## 2017-11-13 MED ORDER — LACTATED RINGERS IV SOLN
INTRAVENOUS | Status: DC
  Administered 2017-11-13 (×2): via INTRAVENOUS

## 2017-11-13 SURGICAL SUPPLY — 57 items
ADH SKN CLS APL DERMABOND .7 (GAUZE/BANDAGES/DRESSINGS) ×1
AID PSTN UNV HD RSTRNT DISP (MISCELLANEOUS) ×1
BASEPLATE GLENOID SHLDR SM (Shoulder) ×3 IMPLANT
BLADE SAW SGTL 83.5X18.5 (BLADE) ×3 IMPLANT
BSPLAT GLND SM PRFT SHLDR CA (Shoulder) ×1 IMPLANT
COVER SURGICAL LIGHT HANDLE (MISCELLANEOUS) ×3 IMPLANT
CUP SUT UNIV REVERS 39 +2 (Shoulder) ×2 IMPLANT
DERMABOND ADVANCED (GAUZE/BANDAGES/DRESSINGS) ×2
DERMABOND ADVANCED .7 DNX12 (GAUZE/BANDAGES/DRESSINGS) ×1 IMPLANT
DRAPE ORTHO SPLIT 77X108 STRL (DRAPES) ×6
DRAPE SURG 17X11 SM STRL (DRAPES) ×3 IMPLANT
DRAPE SURG ORHT 6 SPLT 77X108 (DRAPES) ×2 IMPLANT
DRAPE U-SHAPE 47X51 STRL (DRAPES) ×3 IMPLANT
DRSG AQUACEL AG ADV 3.5X10 (GAUZE/BANDAGES/DRESSINGS) ×3 IMPLANT
DURAPREP 26ML APPLICATOR (WOUND CARE) ×3 IMPLANT
ELECT BLADE 4.0 EZ CLEAN MEGAD (MISCELLANEOUS) ×3
ELECT CAUTERY BLADE 6.4 (BLADE) ×3 IMPLANT
ELECT REM PT RETURN 9FT ADLT (ELECTROSURGICAL) ×3
ELECTRODE BLDE 4.0 EZ CLN MEGD (MISCELLANEOUS) ×1 IMPLANT
ELECTRODE REM PT RTRN 9FT ADLT (ELECTROSURGICAL) ×1 IMPLANT
FACESHIELD WRAPAROUND (MASK) ×9 IMPLANT
FACESHIELD WRAPAROUND OR TEAM (MASK) ×3 IMPLANT
GLENOSPHERE LAT 39+4 SHOULDER (Shoulder) ×2 IMPLANT
GLOVE BIO SURGEON STRL SZ7.5 (GLOVE) ×3 IMPLANT
GLOVE BIO SURGEON STRL SZ8 (GLOVE) ×3 IMPLANT
GLOVE EUDERMIC 7 POWDERFREE (GLOVE) ×3 IMPLANT
GLOVE SS BIOGEL STRL SZ 7.5 (GLOVE) ×1 IMPLANT
GLOVE SUPERSENSE BIOGEL SZ 7.5 (GLOVE) ×2
GOWN STRL REUS W/ TWL LRG LVL3 (GOWN DISPOSABLE) ×1 IMPLANT
GOWN STRL REUS W/ TWL XL LVL3 (GOWN DISPOSABLE) ×2 IMPLANT
GOWN STRL REUS W/TWL LRG LVL3 (GOWN DISPOSABLE) ×3
GOWN STRL REUS W/TWL XL LVL3 (GOWN DISPOSABLE) ×6
INSERT HUMERAL MED 39/ +3 (Shoulder) IMPLANT
INSERT MEDIUM HUMERAL 39/ +3 (Shoulder) ×2 IMPLANT
KIT BASIN OR (CUSTOM PROCEDURE TRAY) ×3 IMPLANT
KIT TURNOVER KIT B (KITS) ×3 IMPLANT
MANIFOLD NEPTUNE II (INSTRUMENTS) ×3 IMPLANT
NEEDLE TAPERED W/ NITINOL LOOP (MISCELLANEOUS) ×3 IMPLANT
NS IRRIG 1000ML POUR BTL (IV SOLUTION) ×3 IMPLANT
PACK SHOULDER (CUSTOM PROCEDURE TRAY) ×3 IMPLANT
PAD ARMBOARD 7.5X6 YLW CONV (MISCELLANEOUS) ×6 IMPLANT
RESTRAINT HEAD UNIVERSAL NS (MISCELLANEOUS) ×3 IMPLANT
SCREW CENTRAL NONLOCK 25MM (Screw) ×2 IMPLANT
SCREW LOCK PERIPHERAL 30MM (Shoulder) ×2 IMPLANT
SCREW LOCK PERIPHERAL 36MM (Screw) ×3 IMPLANT
SET PIN UNIVERSAL REVERSE (SET/KITS/TRAYS/PACK) ×2 IMPLANT
SLING ARM FOAM STRAP LRG (SOFTGOODS) ×2 IMPLANT
SPACER REVERSE UNI 39/ +6MM (Shoulder) ×1 IMPLANT
SPACER SHLD UNI REV 39 +6 (Shoulder) ×1 IMPLANT
STEM HUM UNIV REV SZ11 (Stem) ×3 IMPLANT
SUT MNCRL AB 3-0 PS2 18 (SUTURE) ×3 IMPLANT
SUT MON AB 2-0 CT1 27 (SUTURE) ×3 IMPLANT
SUT VIC AB 1 CT1 27 (SUTURE) ×3
SUT VIC AB 1 CT1 27XBRD ANBCTR (SUTURE) ×1 IMPLANT
SUTURE TAPE 1.3 40 TPR END (SUTURE) ×1 IMPLANT
SUTURETAPE 1.3 40 TPR END (SUTURE) ×6
TOWEL OR 17X26 10 PK STRL BLUE (TOWEL DISPOSABLE) ×3 IMPLANT

## 2017-11-13 NOTE — Anesthesia Procedure Notes (Signed)
Procedure Name: Intubation Date/Time: 11/13/2017 2:43 PM Performed by: Oletta Lamas, CRNA Pre-anesthesia Checklist: Patient identified, Emergency Drugs available, Suction available and Patient being monitored Patient Re-evaluated:Patient Re-evaluated prior to induction Oxygen Delivery Method: Circle System Utilized Preoxygenation: Pre-oxygenation with 100% oxygen Induction Type: IV induction Ventilation: Mask ventilation without difficulty Laryngoscope Size: Miller and 3 Grade View: Grade II Tube type: Oral Tube size: 7.5 mm Number of attempts: 1 Airway Equipment and Method: Stylet Placement Confirmation: ETT inserted through vocal cords under direct vision,  positive ETCO2 and breath sounds checked- equal and bilateral Secured at: 22 cm Tube secured with: Tape Dental Injury: Teeth and Oropharynx as per pre-operative assessment

## 2017-11-13 NOTE — Transfer of Care (Signed)
Immediate Anesthesia Transfer of Care Note  Patient: Ian [name]  Procedure(s) Performed: REVERSE RIGHT SHOULDER ARTHROPLASTY (Right Shoulder)  Patient Location: PACU  Anesthesia Type:GA combined with regional for post-op pain  Level of Consciousness: awake, alert , oriented, drowsy and patient cooperative  Airway & Oxygen Therapy: Patient Spontanous Breathing  Post-op Assessment: Report given to RN and Post -op Vital signs reviewed and stable  Post vital signs: Reviewed and stable  Last Vitals:  Vitals Value Taken Time  BP 139/80 11/13/2017  4:31 PM  Temp    Pulse 79 11/13/2017  4:31 PM  Resp 19 11/13/2017  4:31 PM  SpO2 97 % 11/13/2017  4:31 PM  Vitals shown include unvalidated device data.  Last Pain:  Vitals:   11/13/17 1120  TempSrc:   PainSc: 4       Patients Stated Pain Goal: 2 (84/78/41 2820)  Complications: No apparent anesthesia complications

## 2017-11-13 NOTE — Anesthesia Preprocedure Evaluation (Signed)
Anesthesia Evaluation  Patient identified by MRN, date of birth, ID band Patient awake    Reviewed: Allergy & Precautions, NPO status , Patient's Chart, lab work & pertinent test results, Unable to perform ROS - Chart review only  Airway Mallampati: II  TM Distance: >3 FB Neck ROM: Full    Dental no notable dental hx. (+) Teeth Intact   Pulmonary neg pulmonary ROS,  Bilateral pulmonary nodules   Pulmonary exam normal breath sounds clear to auscultation       Cardiovascular hypertension, Pt. on medications + CAD, + Cardiac Stents and + Peripheral Vascular Disease  Normal cardiovascular exam+ dysrhythmias Atrial Fibrillation  Rhythm:Regular Rate:Bradycardia  A Fib 2011 Stent x 1 LAD- patent last cath   Neuro/Psych  Headaches, negative psych ROS   GI/Hepatic GERD  Medicated and Controlled,Diverticulosis   Endo/Other  Hypercholesterolemia  Renal/GU Renal diseaseHx/o renal calculi   ED    Musculoskeletal  (+) Arthritis , Left inguinal hernia S/P Cervical fusion Right shoulder rotator cuff tear with arthropathy   Abdominal   Peds  Hematology Hx/o Thrombocytopenia   Anesthesia Other Findings   Reproductive/Obstetrics                               Chemistry      Component Value Date/Time   NA 141 07/30/2016 0917   K 4.5 07/30/2016 0917   CL 106 07/30/2016 0917   CO2 30 07/30/2016 0917   BUN 17 07/30/2016 0917   CREATININE 0.87 07/30/2016 0917      Component Value Date/Time   CALCIUM 9.4 07/30/2016 0917   ALKPHOS 55 07/14/2009 1117   AST 26 07/14/2009 1117   ALT 23 07/14/2009 1117   BILITOT 0.8 07/14/2009 1117     Lab Results  Component Value Date   WBC 5.4 07/30/2016   HGB 15.1 07/30/2016   HCT 46.5 07/30/2016   MCV 89.3 07/30/2016   PLT 155 07/30/2016   EKG: sinus bradycardia, 1st deg AV Block.  ANGIOGRAPHIC DATA: - Left main: No angiographically significant disease.  Branches into LAD and circumflex. - Left anterior descending (LAD): Proximal calcification noted. There is minor stenosis, 30% in the proximal LAD. The previously placed diagonal stent is widely patent. Just prior to the stent there is approximately a 30% stenosis. Nonflow limiting. - Circumflex artery (CIRC): 2 obtuse marginal branches, widely patent with no angiographically significant disease. - Right coronary artery (RCA): Large dominant vessel giving rise to posterior descending artery with only minor luminal irregularities. LEFT VENTRICULOGRAM:Left ventricular angiogram was done in the 30 RAO projection and revealed mildly reduced contractile performance with an estimated ejection fraction of 45 %. Prior ejection fraction was 48% on nuclear stress test. IMPRESSIONS: 1. Previously placed diagonal stent is widely patent. Minor stenosis in the proximal LAD, proximal to the stent with no flow limiting disease. Proximal LAD calcification noted. 2. Mildly decreased left ventricular systolic function. LVEDP 15 mmHg. Ejection fraction 45 %.   Anesthesia Physical  Anesthesia Plan  ASA: III  Anesthesia Plan: General   Post-op Pain Management:  Regional for Post-op pain   Induction: Intravenous  PONV Risk Score and Plan:   Airway Management Planned: Oral ETT  Additional Equipment:   Intra-op Plan:   Post-operative Plan: Extubation in OR  Informed Consent: I have reviewed the patients History and Physical, chart, labs and discussed the procedure including the risks, benefits and alternatives for the proposed anesthesia with the patient  or authorized representative who has indicated his/her understanding and acceptance.   Dental advisory given  Plan Discussed with: CRNA, Anesthesiologist and Surgeon  Anesthesia Plan Comments:         Anesthesia Quick Evaluation

## 2017-11-13 NOTE — Anesthesia Procedure Notes (Signed)
Anesthesia Regional Block: Interscalene brachial plexus block   Pre-Anesthetic Checklist: ,, timeout performed, Correct Patient, Correct Site, Correct Laterality, Correct Procedure, Correct Position, site marked, Risks and benefits discussed,  Surgical consent,  Pre-op evaluation,  At surgeon's request and post-op pain management  Laterality: Right  Prep: chloraprep       Needles:  Injection technique: Single-shot  Needle Type: Echogenic Stimulator Needle     Needle Length: 9cm  Needle Gauge: 21   Needle insertion depth: 6 cm   Additional Needles:   Procedures:,,,, ultrasound used (permanent image in chart),,,,  Motor weakness within 8 minutes.  Narrative:  Start time: 11/13/2017 1:37 PM End time: 11/13/2017 1:42 PM Injection made incrementally with aspirations every 5 mL.  Performed by: Personally  Anesthesiologist: Josephine Igo, MD  Additional Notes: Timeout performed. Patient sedated. Relevant anatomy ID'd using [place]. Incremental 2-71ml injection of LA with frequent aspiration. Patient tolerated procedure well.        Right Interscalene Block

## 2017-11-13 NOTE — Discharge Instructions (Signed)
° °Kevin M. Supple, M.D., F.A.A.O.S. °Orthopaedic Surgery °Specializing in Arthroscopic and Reconstructive °Surgery of the Shoulder and Knee °[phone] °[address] - [place], [place] [address] - Fax [phone] ° ° °POST-OP TOTAL SHOULDER REPLACEMENT INSTRUCTIONS ° °1. Call the office at [phone] to schedule your first post-op appointment 10-14 days from the date of your surgery. ° °2. The bandage over your incision is waterproof. You may begin showering with this dressing on. You may leave this dressing on until first follow up appointment within 2 weeks. We prefer you leave this dressing in place until follow up however after 5-7 days if you are having itching or skin irritation and would like to remove it you may do so. Go slow and tug at the borders gently to break the bond the dressing has with the skin. At this point if there is no drainage it is okay to go without a bandage or you may cover it with a light guaze and tape. You can also expect significant bruising around your shoulder that will drift down your arm and into your chest wall. This is very normal and should resolve over several days. ° ° 3. Wear your sling/immobilizer at all times except to perform the exercises below or to occasionally let your arm dangle by your side to stretch your elbow. You also need to sleep in your sling immobilizer until instructed otherwise. ° °4. Range of motion to your elbow, wrist, and hand are encouraged 3-5 times daily. Exercise to your hand and fingers helps to reduce swelling you may experience. ° °5. Utilize ice to the shoulder 3-5 times minimum a day and additionally if you are experiencing pain. ° °6. Prescriptions for a pain medication and a muscle relaxant are provided for you. It is recommended that if you are experiencing pain that you pain medication alone is not controlling, add the muscle relaxant along with the pain medication which can give additional pain relief. The first 1-2 days  is generally the most severe of your pain and then should gradually decrease. As your pain lessens it is recommended that you decrease your use of the pain medications to an "as needed basis'" only and to always comply with the recommended dosages of the pain medications. ° °7. Pain medications can produce constipation along with their use. If you experience this, the use of an over the counter stool softener or laxative daily is recommended.  ° °8. For additional questions or concerns, please do not hesitate to call the office. If after hours there is an answering service to forward your concerns to the physician on call. ° °9.Pain control following an exparel block ° °To help control your post-operative pain you received a nerve block  performed with Exparel which is a long acting anesthetic (numbing agent) which can provide pain relief and sensations of numbness (and relief of pain) in the operative shoulder and arm for up to 3 days. Sometimes it provides mixed relief, meaning you may still have numbness in certain areas of the arm but can still be able to move  parts of that arm, hand, and fingers. We recommend that your prescribed pain medications  be used as needed. We do not feel it is necessary to "pre medicate" and "stay ahead" of pain.  Taking narcotic pain medications when you are not having any pain can lead to unnecessary and potentially dangerous side effects.  ° °POST-OP EXERCISES ° °Pendulum Exercises ° °Perform pendulum exercises while standing and bending at   the waist. Support your uninvolved arm on a table or chair and allow your operated arm to hang freely. Make sure to do these exercises passively - not using you shoulder muscles. ° °Repeat 20 times. Do 3 sessions per day. ° ° ° ° °

## 2017-11-13 NOTE — H&P (Signed)
Ian [name]    Chief Complaint: right shoulder rotator cuff tear arthropathy HPI: The patient is a 72 y.o. male with end stage right shoulder rotator cuff tear arthropathy  Past Medical History:  Diagnosis Date  . Arthritis   . Atherosclerosis of aorta ([place])   . CAD S/P percutaneous coronary angioplasty 06/2002   Cath showed subtotal occlusion of D1 -- staged PCI with Taxus DES 2.5 x 12; 08/2011: Cath for persistent CP despite normal Myoview -> 30% pLAD & pD1 with patent D1 stent.  EF ~45%.  . Cancer ([place])    skin cancer  . Diverticulosis   . Dysrhythmia   . GERD (gastroesophageal reflux disease)   . History of kidney stones   . HTN (hypertension)   . Hypercholesterolemia   . Lung nodule    rt lower lobe  . Migraine   . PAF (paroxysmal atrial fibrillation) ([place]) 2011   seen on EKG  . Thrombocytopenia ([place])   . Thyroid cyst   . Urine findings abnormal    positive protein  . Varicose veins    ejection fraction 48% 06/2010    Past Surgical History:  Procedure Laterality Date  . CERVICAL FUSION  1995; ~2008   Twice  . CORONARY STENT INTERVENTION  06/28/2002   DIAG1 PCI: Taxus DES 2.5 x 12   . diagonal stent     DIAG1 DES 06/28/02  . INGUINAL HERNIA REPAIR Left 08/08/2016   Procedure: OPEN LEFT INGUINAL HERNIA REPAIR WITH MESH;  Surgeon: Armandina Gemma, MD;  [facility]S;  Service: General;  Laterality: Left;  . INSERTION OF MESH Left 08/08/2016   Procedure: INSERTION OF MESH;  Surgeon: Armandina Gemma, MD;  Location: [facility] ORS;  Service: General;  Laterality: Left;  . LEFT HEART CATHETERIZATION WITH CORONARY ANGIOGRAM N/A 11/07/2011   Procedure: LEFT HEART CATHETERIZATION WITH CORONARY ANGIOGRAM;  Surgeon: Candee Furbish, MD;  Location: [facility] CATH LAB;  Service: Cardiovascular: - Equivocal persistent symptoms despite nonischemic Myoview. EF ~45%. 30% calcified pLAD, 30% D1 prestent. Widely paent D1 DES stent.  . [place], 2010, 2011   Total 3 surgeries  . TRIGGER FINGER  RELEASE      Family History  Problem Relation Age of Onset  . Hypertension Mother   . Dementia Mother   . Coronary artery disease Father   . Esophageal cancer Father   . Cancer Sister        breast, one sister  . Cancer - Prostate Brother   . Cervical cancer Maternal Grandmother        colon  . Heart attack Maternal Grandfather 63       He was a smoker    Social History:  reports that he has never smoked. He has never used smokeless tobacco. He reports that he does not drink alcohol or use drugs.   Medications Prior to Admission  Medication Sig Dispense Refill  . aspirin EC 81 MG tablet Take 81 mg by mouth at bedtime.     . Calcium Carb-Cholecalciferol (CALCIUM 600+D) 600-800 MG-UNIT TABS Take 1 tablet by mouth daily.     . cetirizine (ZYRTEC) 10 MG tablet Take 10 mg by mouth at bedtime.     . Coenzyme Q10 (COQ10) 400 MG CAPS Take 400 mg by mouth daily.     Marland Kitchen esomeprazole (NEXIUM) 20 MG capsule Take 20 mg by mouth every Monday, Wednesday, and Friday. In the morning    . Eyelid Cleansers (AVENOVA) 0.01 % SOLN Place  1 application into both eyes 2 (two) times daily.    . folic acid (FOLVITE) 366 MCG tablet Take 400 mcg by mouth daily.    Marland Kitchen gabapentin (NEURONTIN) 100 MG capsule Take 200 mg by mouth every 6 (six) hours.  2  . Glucosamine HCl-MSM (GLUCOSAMINE-MSM PO) Take 1 tablet by mouth daily.     Marland Kitchen HYDROcodone-acetaminophen (NORCO/VICODIN) 5-325 MG tablet Take 1-2 tablets by mouth every 4 (four) hours as needed for moderate pain. (Patient taking differently: Take 1-2 tablets by mouth every 6 (six) hours. ) 20 tablet 0  . lisinopril (PRINIVIL,ZESTRIL) 20 MG tablet Take 20 mg by mouth at bedtime.     . methocarbamol (ROBAXIN) 500 MG tablet Take 500 mg by mouth every 6 (six) hours.  2  . Misc Natural Products (TART CHERRY ADVANCED) CAPS Take 2 capsules by mouth daily.     . Multiple Vitamins-Minerals ([place] ADULT 50+) CAPS Take 1 capsule by mouth daily.    . NONFORMULARY OR  COMPOUNDED ITEM Apply 1-2 g topically 4 (four) times daily as needed. For shoulder pain. (Base Cream Vanishing Cream-Lidocaine 2%/Diclofenac 3%/Baclofen 2%/Cyclobenzaprine 2%)  1  . Riboflavin 100 MG TABS Take 400 mg by mouth daily.    Marland Kitchen tobramycin-dexamethasone (TOBRADEX) ophthalmic solution Place 1 drop into both eyes 2 (two) times daily as needed for dry eyes.    . vitamin B-12 (CYANOCOBALAMIN) 1000 MCG tablet Take 1,000 mcg by mouth daily.    . vitamin C (ASCORBIC ACID) 500 MG tablet Take 1,000 mg by mouth daily.     Marland Kitchen XIIDRA 5 % SOLN Place 1 drop into both eyes 2 (two) times daily as needed for dry eyes.    Marland Kitchen HYDROcodone-ibuprofen (VICOPROFEN) 7.5-200 MG tablet Take 1 tablet by mouth every 6 (six) hours as needed for severe pain.    . naproxen sodium (ANAPROX) 220 MG tablet Take 220-440 mg by mouth 2 (two) times daily as needed (pain).    . nitroGLYCERIN (NITROSTAT) 0.4 MG SL tablet Place 0.4 mg under the tongue every 5 (five) minutes as needed. For chest pain    . sildenafil (VIAGRA) 50 MG tablet Take 50 mg by mouth daily as needed for erectile dysfunction.       Physical Exam: right shoulder with painful and restricted motion as noted at recent office visits  Vitals  Temp:  [98.2 F (36.8 C)] 98.2 F (36.8 C) (08/01 1054) Pulse Rate:  [73] 73 (08/01 1054) Resp:  [18] 18 (08/01 1054) BP: (120)/(65) 120/65 (08/01 1054) SpO2:  [99 %] 99 % (08/01 1054) Weight:  [81.9 kg (180 lb 8 oz)] 81.9 kg (180 lb 8 oz) (08/01 1054)  Assessment/Plan  Impression: right shoulder rotator cuff tear arthropathy  Plan of Action: Procedure(s): REVERSE RIGHT SHOULDER ARTHROPLASTY  Nabria Nevin M Careem Yasui 11/13/2017, 1:55 PM Contact # [phone]

## 2017-11-13 NOTE — Anesthesia Postprocedure Evaluation (Signed)
Anesthesia Post Note  Patient: Ian [name]  Procedure(s) Performed: REVERSE RIGHT SHOULDER ARTHROPLASTY (Right Shoulder)     Patient location during evaluation: PACU Anesthesia Type: General Level of consciousness: awake Pain management: pain level controlled Vital Signs Assessment: post-procedure vital signs reviewed and stable Respiratory status: spontaneous breathing Cardiovascular status: stable Postop Assessment: no apparent nausea or vomiting Anesthetic complications: no    Last Vitals:  Vitals:   11/13/17 1715 11/13/17 1726  BP:  129/77  Pulse:  74  Resp:    Temp: 36.6 C 37 C  SpO2:  99%    Last Pain:  Vitals:   11/13/17 1726  TempSrc: Oral  PainSc:    Pain Goal: Patients Stated Pain Goal: 2 (11/13/17 1120)               Lovinia Snare JR,Ian [name]

## 2017-11-13 NOTE — Op Note (Signed)
11/13/2017  4:15 PM  PATIENT:   Ian [name]  72 y.o. male  PRE-OPERATIVE DIAGNOSIS:  right shoulder rotator cuff tear arthropathy  POST-OPERATIVE DIAGNOSIS:  same  PROCEDURE:  R RSA #11 stem, +6 spacer, +3 poly, 39/+4 glenosphere, small baseplate  SURGEON:  Wana Mount, Metta Clines M.D.  ASSISTANTS: Shuford pac   ANESTHESIA:   GET + ISB  EBL: 150  SPECIMEN:  none  Drains: none   PATIENT DISPOSITION:  PACU - hemodynamically stable.    PLAN OF CARE: Admit for overnight observation   Brief history:  Mr. Dunnigan has had chronically progressive right shoulder pain and functional limitations related to rotator cuff tear arthropathy of the right shoulder.  Due to his increasing pain and functional limitations he is brought to the operating this time for planned right shoulder reverse arthroplasty  Preoperatively and counseled Mr. Steelman regarding treatment options as well as the potential risks versus benefits throughout.  Possible surgical complications were reviewed including potential for bleeding, infection, neurovascular injury, persistence of pain, failure the implant, anesthetic complication, and possible need for additional surgery.  He understands and accepts and agrees with the planned procedure.  Procedure in detail after undergoing routine preop evaluation patient received prophylactic antibiotics and a interscalene block with Exparel was established in the holding area by the anesthesia department.  Placed supine on the operative table underwent smooth induction of general endotracheal anesthesia.  Placed in the beachchair position and appropriately padded and protected.  The right shoulder girdle region was sterilely prepped and draped in standard fashion.  Timeout was called.  An anterior deltopectoral approach to the right shoulder was made through an 8 cm incision.  Dissection carried deeply electrocautery was used for hemostasis.  The deltopectoral interval was then opened from proximal  distal with a vein taken laterally and large varicosities ligated proximally.  Upper centimeter the pectoralis major tendon was tenotomized.  Adhesions divided beneath the deltoid and the conjoined tendon was mobilized and retracted medially.  The long head biceps tendon was then unroofed, and then tenodesed at the superior border of the pectoralis major tendon and it was then tenotomized and excised proximally.  We then performed a subscapularis "peel" remove the graft from the lesser tuberosity and tagged free margin with a pair of grasping suture tape sutures.  Capsular attachments over the anterior inferior and inferior aspects of the humeral neck within divided subperiosteally and the humeral head was delivered through the wound.  Some remnant of the rotator cuff superiorly was released to gain complete exposure of the humeral head and there was some fairly significant amount of the rotator cuff intact posteriorly which we preserved.  We outlined the proposed humeral head resection using the extra medullary guide this was completed with an oscillating saw.  We then placed a metal cap over the cut proximal humeral surface and then exposed the glenoid with combination of Fukuda, pitchfork, inspecting retractors.  I performed a circumferential labral resection gaining complete visualization the periphery of the glenoid.  Guidepin was then placed into the center of the glenoid with an approximate 10 degree inferior tilt and the glenoid was then prepared with central by peripheral reamer to an excellent stable subchondral bony bed.  We then impacted our small baseplate under the glenoid this was transfixed with a central lag screw followed by inferior and superior locking screws all of which obtained excellent bony purchase and fixation.  A 39+4 glenosphere was then placed on the baseplate and impacted in stable  fixation confirmed.  We then returned our attention to the proximal humerus where performed preparation  hand reaming up to size 7 broaching up to size 11 at the native retroversion approximate 30 degrees.  Metaphyseal reaming was performed with a posterior offset.  A trial reduction was then performed showing good soft tissue balance and good position good stability.  At this point a final implant was assembled on the back table size 11.  This was then impacted into the humeral canal confirming excellent fit and fixation.  We then performed a series of trial reductions and felt that a total of + from the implant was the best soft tissue balance we used a +6 spacer and a +3 poly-for a total of +.  This final reduction showed excellent soft tissue balance good stability good motion.  The joint was then copes irrigated hemostasis was obtained.  The subscapularis was then repaired back to the humeral metaphysis utilizing eyelets on the collar of our implant.  Once this was completed we can easily externally rotate the arm to 45 degrees without excessive tension on the repair.  The deltopectoral interval was then reapproximated with a series of figure-of-eight #1 Vicryl sutures.  2-0 Monocryl used with subcu layer intracuticular through Monocryl for the skin followed by Dermabond and Aquasol dressing right arm was placed in sling patient was then awakened extubated and taken to the recovery room in stable condition  [facility], PA-C was used as an Environmental consultant throughout this case essential for help with positioning the patient, position extremity, tissue manipulation, suture management, wound closure, and intraoperative decision-making.  Marin Shutter MD      Contact # [phone]

## 2017-11-14 ENCOUNTER — Other Ambulatory Visit: Payer: Self-pay

## 2017-11-14 ENCOUNTER — Encounter (HOSPITAL_COMMUNITY): Payer: Self-pay | Admitting: General Practice

## 2017-11-14 HISTORY — PX: REVERSE SHOULDER ARTHROPLASTY: SHX5054

## 2017-11-14 MED ORDER — HYDROMORPHONE HCL 2 MG PO TABS: 2.0000 mg | ORAL_TABLET | ORAL | 0 refills | Status: DC | PRN

## 2017-11-14 MED ORDER — IBUPROFEN 800 MG PO TABS: 800.0000 mg | ORAL_TABLET | Freq: Three times a day (TID) | ORAL | 0 refills | Status: DC | PRN

## 2017-11-14 MED ORDER — HYDROCODONE-ACETAMINOPHEN 5-325 MG PO TABS: 1.0000 | ORAL_TABLET | ORAL | 0 refills | Status: DC | PRN

## 2017-11-14 MED ORDER — METHOCARBAMOL 500 MG PO TABS: 500.0000 mg | ORAL_TABLET | Freq: Four times a day (QID) | ORAL | 2 refills | Status: DC | PRN

## 2017-11-14 NOTE — Discharge Summary (Signed)
PATIENT ID:      Ian [name]  MRN:     [id number] DOB/AGE:    [birth year] / 72 y.o.     DISCHARGE SUMMARY  ADMISSION DATE:    11/13/2017 DISCHARGE DATE:    ADMISSION DIAGNOSIS: right shoulder rotator cuff tear arthropathy Past Medical History:  Diagnosis Date  . Arthritis   . Atherosclerosis of aorta ([place])   . CAD S/P percutaneous coronary angioplasty 06/2002   Cath showed subtotal occlusion of D1 -- staged PCI with Taxus DES 2.5 x 12; 08/2011: Cath for persistent CP despite normal Myoview -> 30% pLAD & pD1 with patent D1 stent.  EF ~45%.  . Cancer ([place])    skin cancer  . Diverticulosis   . Dysrhythmia   . GERD (gastroesophageal reflux disease)   . History of kidney stones   . HTN (hypertension)   . Hypercholesterolemia   . Lung nodule    rt lower lobe  . Migraine   . PAF (paroxysmal atrial fibrillation) ([place]) 2011   seen on EKG  . Thrombocytopenia ([place])   . Thyroid cyst   . Urine findings abnormal    positive protein  . Varicose veins    ejection fraction 48% 06/2010    DISCHARGE DIAGNOSIS:   Active Problems:   S/p reverse total shoulder arthroplasty   PROCEDURE: Procedure(s): REVERSE RIGHT SHOULDER ARTHROPLASTY on 11/13/2017  CONSULTS:    HISTORY:  See H&P in chart.  HOSPITAL COURSE:  REZA CRYMES is a 72 y.o. admitted on 11/13/2017 with a diagnosis of right shoulder rotator cuff tear arthropathy.  They were brought to the operating room on 11/13/2017 and underwent Procedure(s): REVERSE RIGHT SHOULDER ARTHROPLASTY.    They were given perioperative antibiotics:  Anti-infectives (From admission, onward)   Start     Dose/Rate Route Frequency Ordered Stop   11/13/17 1100  ceFAZolin (ANCEF) IVPB 2g/100 mL premix     2 g 200 mL/hr over 30 Minutes Intravenous On call to O.R. 11/13/17 1045 11/13/17 1445   11/13/17 1054  ceFAZolin (ANCEF) 2-4 GM/100ML-% IVPB    Note to Pharmacy:  Jasmine Pang   : cabinet override      11/13/17 1054 11/13/17 1445    .  Patient underwent  the above named procedure and tolerated it well. The following day they were hemodynamically stable and pain was controlled on oral analgesics. They were neurovascularly intact to the operative extremity. OT was ordered and worked with patient per protocol. They were medically and orthopaedically stable for discharge on day 1.    DIAGNOSTIC STUDIES:  RECENT RADIOGRAPHIC STUDIES :  No results found.  RECENT VITAL SIGNS:   Patient Vitals for the past 24 hrs:  BP Temp Temp src Pulse Resp SpO2 Height Weight  11/14/17 0431 - 98.7 F (37.1 C) Oral - - - - -  11/14/17 0408 136/89 - - (!) 101 16 97 % - -  11/14/17 0047 137/76 98.2 F (36.8 C) Oral 94 16 96 % - -  11/13/17 2056 124/73 98.3 F (36.8 C) Oral 93 16 95 % - -  11/13/17 1726 129/77 98.6 F (37 C) Oral 74 - 99 % - -  11/13/17 1715 - 97.8 F (36.6 C) - - - - - -  11/13/17 1712 - - - 79 15 97 % - -  11/13/17 1700 136/78 - - 84 13 96 % - -  11/13/17 1645 132/78 - - 80 16 98 % - -  11/13/17 1632 - - -  84 19 93 % - -  11/13/17 1631 139/80 98.4 F (36.9 C) - 79 19 97 % - -  11/13/17 1630 - - - 77 - 98 % - -  11/13/17 1054 120/65 98.2 F (36.8 C) Oral 73 18 99 % 5\' 11"  (1.803 m) 81.9 kg (180 lb 8 oz)  .  RECENT EKG RESULTS:    Orders placed or performed during the hospital encounter of 11/13/17  . EKG 12-Lead  . EKG 12-Lead    DISCHARGE INSTRUCTIONS:    DISCHARGE MEDICATIONS:   Allergies as of 11/14/2017      Reactions   Adhesive [tape] Rash   Band aid adhesive ---- RASH Skin peels off   Codeine Nausea And Vomiting   Demerol [meperidine] Nausea And Vomiting   Lescol [fluvastatin Sodium] Other (See Comments)   Muscle pain   Morphine And Related Nausea And Vomiting   Oxycodone Nausea And Vomiting   Penicillins Nausea And Vomiting   Has patient had a PCN reaction causing immediate rash, facial/tongue/throat swelling, SOB or lightheadedness with hypotension: No Has patient had a PCN reaction causing severe rash involving  mucus membranes or skin necrosis: No Has patient had a PCN reaction that required hospitalization No Has patient had a PCN reaction occurring within the last 10 years: No If all of the above answers are "NO", then may proceed with Cephalosporin use.   Pravachol [pravastatin Sodium] Other (See Comments)   Muscle pain   Welchol [colesevelam Hcl] Other (See Comments)   Weakness   Zetia [ezetimibe] Other (See Comments)   Muscle pain      Medication List    STOP taking these medications   HYDROcodone-ibuprofen 7.5-200 MG tablet Commonly known as:  VICOPROFEN   naproxen sodium 220 MG tablet Commonly known as:  ALEVE     TAKE these medications   aspirin EC 81 MG tablet Take 81 mg by mouth at bedtime.   AVENOVA 0.01 % Soln Place 1 application into both eyes 2 (two) times daily.   CALCIUM 600+D 600-800 MG-UNIT Tabs Generic drug:  Calcium Carb-Cholecalciferol Take 1 tablet by mouth daily.   cetirizine 10 MG tablet Commonly known as:  ZYRTEC Take 10 mg by mouth at bedtime.   CoQ10 400 MG Caps Take 400 mg by mouth daily.   esomeprazole 20 MG capsule Commonly known as:  NEXIUM Take 20 mg by mouth every Monday, Wednesday, and Friday. In the morning   folic acid 341 MCG tablet Commonly known as:  FOLVITE Take 400 mcg by mouth daily.   gabapentin 100 MG capsule Commonly known as:  NEURONTIN Take 200 mg by mouth every 6 (six) hours.   GLUCOSAMINE-MSM PO Take 1 tablet by mouth daily.   HYDROcodone-acetaminophen 5-325 MG tablet Commonly known as:  NORCO Take 1-2 tablets by mouth every 4 (four) hours as needed. What changed:  reasons to take this   HYDROmorphone 2 MG tablet Commonly known as:  DILAUDID Take 1 tablet (2 mg total) by mouth every 4 (four) hours as needed for severe pain (for pain not controlled by norco).   ibuprofen 800 MG tablet Commonly known as:  ADVIL,MOTRIN Take 1 tablet (800 mg total) by mouth every 8 (eight) hours as needed.   lisinopril 20 MG  tablet Commonly known as:  PRINIVIL,ZESTRIL Take 20 mg by mouth at bedtime.   methocarbamol 500 MG tablet Commonly known as:  ROBAXIN Take 1 tablet (500 mg total) by mouth every 6 (six) hours as needed for muscle  spasms. What changed:    when to take this  reasons to take this   nitroGLYCERIN 0.4 MG SL tablet Commonly known as:  NITROSTAT Place 0.4 mg under the tongue every 5 (five) minutes as needed. For chest pain   NONFORMULARY OR COMPOUNDED ITEM Apply 1-2 g topically 4 (four) times daily as needed. For shoulder pain. (Base Cream Vanishing Cream-Lidocaine 2%/Diclofenac 3%/Baclofen 2%/Cyclobenzaprine 2%)   OCUVITE ADULT 50+ Caps Take 1 capsule by mouth daily.   Riboflavin 100 MG Tabs Take 400 mg by mouth daily.   sildenafil 50 MG tablet Commonly known as:  VIAGRA Take 50 mg by mouth daily as needed for erectile dysfunction.   TART CHERRY ADVANCED Caps Take 2 capsules by mouth daily.   tobramycin-dexamethasone ophthalmic solution Commonly known as:  TOBRADEX Place 1 drop into both eyes 2 (two) times daily as needed for dry eyes.   vitamin B-12 1000 MCG tablet Commonly known as:  CYANOCOBALAMIN Take 1,000 mcg by mouth daily.   vitamin C 500 MG tablet Commonly known as:  ASCORBIC ACID Take 1,000 mg by mouth daily.   XIIDRA 5 % Soln Generic drug:  Lifitegrast Place 1 drop into both eyes 2 (two) times daily as needed for dry eyes.       FOLLOW UP VISIT:   Follow-up Information    Justice Britain, MD.   Specialty:  Orthopedic Surgery Why:  call to be seen in 10- 14 days Contact information: [address] [place] [address] [phone]           DISCHARGE QM:GQQP   DISCHARGE CONDITION:  Thereasa Parkin Johonna Binette for Dr. Justice Britain 11/14/2017, 8:37 AM

## 2017-11-14 NOTE — Progress Notes (Signed)
Discharge instructions completed with pt.  Pt verbalized understanding of the information.  Pt denies chest pain, shortness of breath, dizziness, lightheadedness, and n/v.  Pt's IV d/c'ed. Pt discharged home.  

## 2017-11-14 NOTE — Evaluation (Signed)
Occupational Therapy Evaluation Patient Details Name: Ian [name] MRN: [id number] DOB: 09-04-1945 Today's Date: 11/14/2017    History of Present Illness 72 yo male s/p reverse R TSA PMH: back surg by Dr Carloyn Manner    Clinical Impression   Patient is s/p reverse R TSA surgery resulting in functional limitations due to the deficits listed below (see OT problem list). Pt currently at adequate level for d/c home with wife. Pt dressed and educated on shoulder positioning allowed. pt with education and return demo of shoulder sling. Patient will benefit from skilled OT acutely to increase independence and safety with ADLS to allow discharge home with MD follow up in 2 weeks.     Follow Up Recommendations  Follow surgeon's recommendation for DC plan and follow-up therapies    Equipment Recommendations  None recommended by OT    Recommendations for Other Services       Precautions / Restrictions Precautions Precautions: Shoulder Precaution Comments: pendulums/ lap slids /  FF 60 ABduction 45 ER 20 Restrictions Weight Bearing Restrictions: Yes RUE Weight Bearing: Non weight bearing      Mobility Bed Mobility Overal bed mobility: Independent             General bed mobility comments: exiting on L side due to R shoulder  Transfers Overall transfer level: Needs assistance Equipment used: 1 person hand held assist Transfers: Sit to/from Stand Sit to Stand: Min assist         General transfer comment: first time up . pt needs (A) for steady     Balance                                           ADL either performed or assessed with clinical judgement   ADL Overall ADL's : Needs assistance/impaired Eating/Feeding: Set up;Sitting   Grooming: Wash/dry face;Set up;Sitting     Upper Body Bathing Details (indicate cue type and reason): educated on bathing / positioning and clean linen     Upper Body Dressing : Minimal assistance;Sitting Upper Body Dressing  Details (indicate cue type and reason): educated on dressing R UE first      Toilet Transfer: Minimal assistance Toilet Transfer Details (indicate cue type and reason): pt requires steady (A) and pt with nausea after initial transfer. BP obtained by tech after movement (see vitals)           General ADL Comments: pt OOB to chair to dress. pt educated on AROM allowed for ADLs / Pendulums and lap slides. Pt with return demo. handouts provided for all education. no family present. OT leaving name/ number in case wife has questions     Vision Baseline Vision/History: Wears glasses Wears Glasses: At all times       Perception     Praxis      Pertinent Vitals/Pain Pain Assessment: 0-10 Pain Score: 6  Pain Location: shoulder Pain Descriptors / Indicators: Operative site guarding;Aching Pain Intervention(s): Monitored during session;Premedicated before session;Repositioned     Hand Dominance Right   Extremity/Trunk Assessment Upper Extremity Assessment Upper Extremity Assessment: RUE deficits/detail RUE Deficits / Details: s/p surg   Lower Extremity Assessment Lower Extremity Assessment: Overall WFL for tasks assessed   Cervical / Trunk Assessment Cervical / Trunk Assessment: Other exceptions(s/p back surgery per patient in last 7 weeks)   Communication Communication Communication: No difficulties   Cognition Arousal/Alertness: Awake/alert Behavior  During Therapy: WFL for tasks assessed/performed Overall Cognitive Status: Within Functional Limits for tasks assessed                                     General Comments  dressing dry and intact    Exercises Exercises: Shoulder Shoulder Exercises Pendulum Exercise: Right;10 reps;Standing(lap slides)   Shoulder Instructions Shoulder Instructions Donning/doffing shirt without moving shoulder: Minimal assistance;Patient able to independently direct caregiver Method for sponge bathing under operated UE:  Minimal assistance;Patient able to independently direct caregiver Donning/doffing sling/immobilizer: Supervision/safety;Patient able to independently direct caregiver Correct positioning of sling/immobilizer: Modified independent Pendulum exercises (written home exercise program): Supervision/safety;Patient able to independently direct caregiver ROM for elbow, wrist and digits of operated UE: Modified independent Sling wearing schedule (on at all times/off for ADL's): Modified independent Positioning of UE while sleeping: Minimal assistance;Patient able to independently direct caregiver    Home Living Family/patient expects to be discharged to:: Private residence Living Arrangements: Spouse/significant other Available Help at Discharge: Family;Available 24 hours/day Type of Home: House Home Access: Stairs to enter [employer] of Steps: 2   Home Layout: Multi-level;Bed/bath upstairs(slit level with 5 steps up to bedrooms)     Bathroom Shower/Tub: Hospital doctor Toilet: Handicapped height     Home Equipment: Environmental consultant - 2 wheels;Shower seat - built in;Cane - single point   Additional Comments: cats 7 - they are indoor/ outdoor      Prior Functioning/Environment Level of Independence: Independent        Comments: occassaionally helps with R sock don        OT Problem List: Decreased activity tolerance;Decreased knowledge of precautions;Decreased range of motion;Pain;Impaired UE functional use      OT Treatment/Interventions: Self-care/ADL training;Therapeutic exercise;Therapeutic activities;Patient/family education;Balance training    OT Goals(Current goals can be found in the care plan section) Acute Rehab OT Goals Patient Stated Goal: to go home today OT Goal Formulation: With patient Time For Goal Achievement: 11/28/17 Potential to Achieve Goals: Good  OT Frequency: Min 3X/week   Barriers to D/C:            Co-evaluation               AM-PAC PT "6 Clicks" Daily Activity     Outcome Measure Help from another person eating meals?: A Little Help from another person taking care of personal grooming?: A Little Help from another person toileting, which includes using toliet, bedpan, or urinal?: A Little Help from another person bathing (including washing, rinsing, drying)?: A Little Help from another person to put on and taking off regular upper body clothing?: A Little Help from another person to put on and taking off regular lower body clothing?: A Little 6 Click Score: 18   End of Session Nurse Communication: Mobility status;Precautions  Activity Tolerance: Patient tolerated treatment well Patient left: in chair;with call bell/phone within reach  OT Visit Diagnosis: Unsteadiness on feet (R26.81)                Time: 4503-8882 OT Time Calculation (min): 40 min Charges:  OT General Charges $OT Visit: 1 Visit OT Evaluation $OT Eval Moderate Complexity: 1 Mod OT Treatments $Self Care/Home Management : 23-37 mins   Jeri Modena   OTR/L Pager: [phone] Office: [phone] .   Parke Poisson B 11/14/2017, 10:52 AM

## 2017-11-17 ENCOUNTER — Encounter (HOSPITAL_COMMUNITY): Payer: Self-pay | Admitting: Orthopedic Surgery

## 2017-11-26 DIAGNOSIS — Z96611 Presence of right artificial shoulder joint: Secondary | ICD-10-CM | POA: Diagnosis not present

## 2017-11-26 DIAGNOSIS — Z471 Aftercare following joint replacement surgery: Secondary | ICD-10-CM | POA: Diagnosis not present

## 2017-11-26 DIAGNOSIS — M19011 Primary osteoarthritis, right shoulder: Secondary | ICD-10-CM | POA: Diagnosis not present

## 2017-12-01 DIAGNOSIS — M25611 Stiffness of right shoulder, not elsewhere classified: Secondary | ICD-10-CM | POA: Diagnosis not present

## 2017-12-02 ENCOUNTER — Other Ambulatory Visit (HOSPITAL_COMMUNITY): Payer: Medicare Other

## 2017-12-03 DIAGNOSIS — M25611 Stiffness of right shoulder, not elsewhere classified: Secondary | ICD-10-CM | POA: Diagnosis not present

## 2017-12-08 DIAGNOSIS — M25611 Stiffness of right shoulder, not elsewhere classified: Secondary | ICD-10-CM | POA: Diagnosis not present

## 2017-12-09 DIAGNOSIS — D692 Other nonthrombocytopenic purpura: Secondary | ICD-10-CM | POA: Diagnosis not present

## 2017-12-09 DIAGNOSIS — L738 Other specified follicular disorders: Secondary | ICD-10-CM | POA: Diagnosis not present

## 2017-12-09 DIAGNOSIS — Z85828 Personal history of other malignant neoplasm of skin: Secondary | ICD-10-CM | POA: Diagnosis not present

## 2017-12-11 DIAGNOSIS — M25611 Stiffness of right shoulder, not elsewhere classified: Secondary | ICD-10-CM | POA: Diagnosis not present

## 2017-12-17 DIAGNOSIS — M545 Low back pain: Secondary | ICD-10-CM | POA: Diagnosis not present

## 2017-12-17 DIAGNOSIS — M25512 Pain in left shoulder: Secondary | ICD-10-CM | POA: Diagnosis not present

## 2017-12-17 DIAGNOSIS — M25511 Pain in right shoulder: Secondary | ICD-10-CM | POA: Diagnosis not present

## 2017-12-18 DIAGNOSIS — H25041 Posterior subcapsular polar age-related cataract, right eye: Secondary | ICD-10-CM | POA: Diagnosis not present

## 2017-12-18 DIAGNOSIS — H2513 Age-related nuclear cataract, bilateral: Secondary | ICD-10-CM | POA: Diagnosis not present

## 2017-12-18 DIAGNOSIS — H43811 Vitreous degeneration, right eye: Secondary | ICD-10-CM | POA: Diagnosis not present

## 2017-12-18 DIAGNOSIS — D3132 Benign neoplasm of left choroid: Secondary | ICD-10-CM | POA: Diagnosis not present

## 2017-12-19 DIAGNOSIS — M25512 Pain in left shoulder: Secondary | ICD-10-CM | POA: Diagnosis not present

## 2017-12-19 DIAGNOSIS — M25511 Pain in right shoulder: Secondary | ICD-10-CM | POA: Diagnosis not present

## 2017-12-19 DIAGNOSIS — M545 Low back pain: Secondary | ICD-10-CM | POA: Diagnosis not present

## 2017-12-22 DIAGNOSIS — M25512 Pain in left shoulder: Secondary | ICD-10-CM | POA: Diagnosis not present

## 2017-12-22 DIAGNOSIS — M25511 Pain in right shoulder: Secondary | ICD-10-CM | POA: Diagnosis not present

## 2017-12-22 DIAGNOSIS — M545 Low back pain: Secondary | ICD-10-CM | POA: Diagnosis not present

## 2017-12-24 DIAGNOSIS — Z471 Aftercare following joint replacement surgery: Secondary | ICD-10-CM | POA: Diagnosis not present

## 2017-12-24 DIAGNOSIS — Z23 Encounter for immunization: Secondary | ICD-10-CM | POA: Diagnosis not present

## 2017-12-24 DIAGNOSIS — Z96611 Presence of right artificial shoulder joint: Secondary | ICD-10-CM | POA: Diagnosis not present

## 2017-12-24 DIAGNOSIS — M25512 Pain in left shoulder: Secondary | ICD-10-CM | POA: Diagnosis not present

## 2017-12-25 DIAGNOSIS — M25511 Pain in right shoulder: Secondary | ICD-10-CM | POA: Diagnosis not present

## 2017-12-25 DIAGNOSIS — M25512 Pain in left shoulder: Secondary | ICD-10-CM | POA: Diagnosis not present

## 2017-12-25 DIAGNOSIS — M545 Low back pain: Secondary | ICD-10-CM | POA: Diagnosis not present

## 2017-12-29 DIAGNOSIS — M545 Low back pain: Secondary | ICD-10-CM | POA: Diagnosis not present

## 2017-12-29 DIAGNOSIS — M25512 Pain in left shoulder: Secondary | ICD-10-CM | POA: Diagnosis not present

## 2017-12-29 DIAGNOSIS — M25511 Pain in right shoulder: Secondary | ICD-10-CM | POA: Diagnosis not present

## 2018-01-01 DIAGNOSIS — M25512 Pain in left shoulder: Secondary | ICD-10-CM | POA: Diagnosis not present

## 2018-01-01 DIAGNOSIS — M25511 Pain in right shoulder: Secondary | ICD-10-CM | POA: Diagnosis not present

## 2018-01-01 DIAGNOSIS — M545 Low back pain: Secondary | ICD-10-CM | POA: Diagnosis not present

## 2018-01-05 DIAGNOSIS — K219 Gastro-esophageal reflux disease without esophagitis: Secondary | ICD-10-CM | POA: Diagnosis not present

## 2018-01-05 DIAGNOSIS — I700 Atherosclerosis of aorta: Secondary | ICD-10-CM | POA: Diagnosis not present

## 2018-01-05 DIAGNOSIS — D696 Thrombocytopenia, unspecified: Secondary | ICD-10-CM | POA: Diagnosis not present

## 2018-01-05 DIAGNOSIS — Z125 Encounter for screening for malignant neoplasm of prostate: Secondary | ICD-10-CM | POA: Diagnosis not present

## 2018-01-05 DIAGNOSIS — Z79899 Other long term (current) drug therapy: Secondary | ICD-10-CM | POA: Diagnosis not present

## 2018-01-05 DIAGNOSIS — Z1389 Encounter for screening for other disorder: Secondary | ICD-10-CM | POA: Diagnosis not present

## 2018-01-05 DIAGNOSIS — I10 Essential (primary) hypertension: Secondary | ICD-10-CM | POA: Diagnosis not present

## 2018-01-05 DIAGNOSIS — Z0000 Encounter for general adult medical examination without abnormal findings: Secondary | ICD-10-CM | POA: Diagnosis not present

## 2018-01-05 DIAGNOSIS — E7800 Pure hypercholesterolemia, unspecified: Secondary | ICD-10-CM | POA: Diagnosis not present

## 2018-01-06 DIAGNOSIS — M25512 Pain in left shoulder: Secondary | ICD-10-CM | POA: Diagnosis not present

## 2018-01-06 DIAGNOSIS — M25511 Pain in right shoulder: Secondary | ICD-10-CM | POA: Diagnosis not present

## 2018-01-06 DIAGNOSIS — M545 Low back pain: Secondary | ICD-10-CM | POA: Diagnosis not present

## 2018-01-08 DIAGNOSIS — M545 Low back pain: Secondary | ICD-10-CM | POA: Diagnosis not present

## 2018-01-08 DIAGNOSIS — M25512 Pain in left shoulder: Secondary | ICD-10-CM | POA: Diagnosis not present

## 2018-01-08 DIAGNOSIS — M25511 Pain in right shoulder: Secondary | ICD-10-CM | POA: Diagnosis not present

## 2018-01-13 DIAGNOSIS — M25512 Pain in left shoulder: Secondary | ICD-10-CM | POA: Diagnosis not present

## 2018-01-13 DIAGNOSIS — M25511 Pain in right shoulder: Secondary | ICD-10-CM | POA: Diagnosis not present

## 2018-01-13 DIAGNOSIS — M545 Low back pain: Secondary | ICD-10-CM | POA: Diagnosis not present

## 2018-01-16 DIAGNOSIS — M25511 Pain in right shoulder: Secondary | ICD-10-CM | POA: Diagnosis not present

## 2018-01-16 DIAGNOSIS — M25512 Pain in left shoulder: Secondary | ICD-10-CM | POA: Diagnosis not present

## 2018-01-16 DIAGNOSIS — M545 Low back pain: Secondary | ICD-10-CM | POA: Diagnosis not present

## 2018-01-20 DIAGNOSIS — M545 Low back pain: Secondary | ICD-10-CM | POA: Diagnosis not present

## 2018-01-20 DIAGNOSIS — M25511 Pain in right shoulder: Secondary | ICD-10-CM | POA: Diagnosis not present

## 2018-01-20 DIAGNOSIS — M25512 Pain in left shoulder: Secondary | ICD-10-CM | POA: Diagnosis not present

## 2018-01-22 DIAGNOSIS — M25512 Pain in left shoulder: Secondary | ICD-10-CM | POA: Diagnosis not present

## 2018-01-22 DIAGNOSIS — M25511 Pain in right shoulder: Secondary | ICD-10-CM | POA: Diagnosis not present

## 2018-01-22 DIAGNOSIS — M545 Low back pain: Secondary | ICD-10-CM | POA: Diagnosis not present

## 2018-01-27 DIAGNOSIS — M25512 Pain in left shoulder: Secondary | ICD-10-CM | POA: Diagnosis not present

## 2018-01-27 DIAGNOSIS — M545 Low back pain: Secondary | ICD-10-CM | POA: Diagnosis not present

## 2018-01-27 DIAGNOSIS — M25511 Pain in right shoulder: Secondary | ICD-10-CM | POA: Diagnosis not present

## 2018-01-30 DIAGNOSIS — M25512 Pain in left shoulder: Secondary | ICD-10-CM | POA: Diagnosis not present

## 2018-01-30 DIAGNOSIS — M545 Low back pain: Secondary | ICD-10-CM | POA: Diagnosis not present

## 2018-01-30 DIAGNOSIS — M25511 Pain in right shoulder: Secondary | ICD-10-CM | POA: Diagnosis not present

## 2018-02-04 DIAGNOSIS — M25512 Pain in left shoulder: Secondary | ICD-10-CM | POA: Diagnosis not present

## 2018-02-04 DIAGNOSIS — Z9889 Other specified postprocedural states: Secondary | ICD-10-CM | POA: Diagnosis not present

## 2018-02-04 DIAGNOSIS — M25511 Pain in right shoulder: Secondary | ICD-10-CM | POA: Diagnosis not present

## 2018-02-04 DIAGNOSIS — M545 Low back pain: Secondary | ICD-10-CM | POA: Diagnosis not present

## 2018-02-10 DIAGNOSIS — M48062 Spinal stenosis, lumbar region with neurogenic claudication: Secondary | ICD-10-CM | POA: Diagnosis not present

## 2018-02-10 DIAGNOSIS — M4316 Spondylolisthesis, lumbar region: Secondary | ICD-10-CM | POA: Diagnosis not present

## 2018-02-10 DIAGNOSIS — M545 Low back pain: Secondary | ICD-10-CM | POA: Diagnosis not present

## 2018-02-10 DIAGNOSIS — M25511 Pain in right shoulder: Secondary | ICD-10-CM | POA: Diagnosis not present

## 2018-02-10 DIAGNOSIS — M25512 Pain in left shoulder: Secondary | ICD-10-CM | POA: Diagnosis not present

## 2018-02-12 DIAGNOSIS — M25511 Pain in right shoulder: Secondary | ICD-10-CM | POA: Diagnosis not present

## 2018-02-12 DIAGNOSIS — M545 Low back pain: Secondary | ICD-10-CM | POA: Diagnosis not present

## 2018-02-12 DIAGNOSIS — M25512 Pain in left shoulder: Secondary | ICD-10-CM | POA: Diagnosis not present

## 2018-02-17 DIAGNOSIS — M25512 Pain in left shoulder: Secondary | ICD-10-CM | POA: Diagnosis not present

## 2018-02-17 DIAGNOSIS — M25511 Pain in right shoulder: Secondary | ICD-10-CM | POA: Diagnosis not present

## 2018-02-17 DIAGNOSIS — M545 Low back pain: Secondary | ICD-10-CM | POA: Diagnosis not present

## 2018-02-19 DIAGNOSIS — M545 Low back pain: Secondary | ICD-10-CM | POA: Diagnosis not present

## 2018-02-19 DIAGNOSIS — M25511 Pain in right shoulder: Secondary | ICD-10-CM | POA: Diagnosis not present

## 2018-02-19 DIAGNOSIS — M25512 Pain in left shoulder: Secondary | ICD-10-CM | POA: Diagnosis not present

## 2018-02-24 DIAGNOSIS — M25512 Pain in left shoulder: Secondary | ICD-10-CM | POA: Diagnosis not present

## 2018-02-24 DIAGNOSIS — M25511 Pain in right shoulder: Secondary | ICD-10-CM | POA: Diagnosis not present

## 2018-02-24 DIAGNOSIS — M545 Low back pain: Secondary | ICD-10-CM | POA: Diagnosis not present

## 2018-02-26 DIAGNOSIS — M545 Low back pain: Secondary | ICD-10-CM | POA: Diagnosis not present

## 2018-02-26 DIAGNOSIS — M25511 Pain in right shoulder: Secondary | ICD-10-CM | POA: Diagnosis not present

## 2018-02-26 DIAGNOSIS — M25512 Pain in left shoulder: Secondary | ICD-10-CM | POA: Diagnosis not present

## 2018-03-03 DIAGNOSIS — Z86010 Personal history of colonic polyps: Secondary | ICD-10-CM | POA: Diagnosis not present

## 2018-03-03 DIAGNOSIS — K219 Gastro-esophageal reflux disease without esophagitis: Secondary | ICD-10-CM | POA: Diagnosis not present

## 2018-03-03 DIAGNOSIS — K5900 Constipation, unspecified: Secondary | ICD-10-CM | POA: Diagnosis not present

## 2018-04-03 DIAGNOSIS — S61459A Open bite of unspecified hand, initial encounter: Secondary | ICD-10-CM | POA: Diagnosis not present

## 2018-04-03 DIAGNOSIS — I10 Essential (primary) hypertension: Secondary | ICD-10-CM | POA: Diagnosis not present

## 2018-04-21 DIAGNOSIS — K648 Other hemorrhoids: Secondary | ICD-10-CM | POA: Diagnosis not present

## 2018-04-21 DIAGNOSIS — D122 Benign neoplasm of ascending colon: Secondary | ICD-10-CM | POA: Diagnosis not present

## 2018-04-21 DIAGNOSIS — D125 Benign neoplasm of sigmoid colon: Secondary | ICD-10-CM | POA: Diagnosis not present

## 2018-04-21 DIAGNOSIS — Z86010 Personal history of colonic polyps: Secondary | ICD-10-CM | POA: Diagnosis not present

## 2018-04-21 DIAGNOSIS — D120 Benign neoplasm of cecum: Secondary | ICD-10-CM | POA: Diagnosis not present

## 2018-04-24 ENCOUNTER — Telehealth: Payer: Self-pay

## 2018-04-24 DIAGNOSIS — D120 Benign neoplasm of cecum: Secondary | ICD-10-CM | POA: Diagnosis not present

## 2018-04-24 DIAGNOSIS — D122 Benign neoplasm of ascending colon: Secondary | ICD-10-CM | POA: Diagnosis not present

## 2018-04-24 DIAGNOSIS — D125 Benign neoplasm of sigmoid colon: Secondary | ICD-10-CM | POA: Diagnosis not present

## 2018-04-24 NOTE — Telephone Encounter (Signed)
   [facility] Pre-operative Risk Assessment    Request for surgical clearance:  1. What type of surgery is being performed? Left shoulder arthroscopy  2. When is this surgery scheduled? TBD  What type of clearance is required (medical clearance vs. Pharmacy clearance to hold med vs. Both)? Both  3. Are there any medications that need to be held prior to surgery and how long? Aspirin  4. Practice name and name of physician performing surgery? [facility]  Dr.Supple  5. What is your office phone number [phone]   7.   What is your office fax number [phone]  8.   Anesthesia type General   Kathyrn Lass 04/24/2018, 3:07 PM  _________________________________________________________________   (provider comments below)

## 2018-04-27 NOTE — Telephone Encounter (Signed)
   Primary Cardiologist:David Ellyn Hack, MD  Chart reviewed as part of pre-operative protocol coverage. Because of Ian [name]'s past medical history and time since last visit, he/she will require a follow-up visit in order to better assess preoperative cardiovascular risk.  Last OV 07/2016 - 1.75 years since last visit.  Pre-op covering staff: - Please schedule appointment and call patient to inform them. - Please contact requesting surgeon's office via preferred method (i.e, phone, fax) to inform them of need for appointment prior to surgery.  If applicable, this message will also be routed to pharmacy pool and/or primary cardiologist for input on holding anticoagulant/antiplatelet agent as requested below so that this information is available at time of patient's appointment. Will route to Dr. Ellyn Hack to inquire whether OK to hold ASA for surgery as outlined if patient is doing well at f/u visit.  Charlie Pitter, PA-C  04/27/2018, 5:20 PM

## 2018-04-28 NOTE — Telephone Encounter (Signed)
Pt has been scheduled to see Dr. Ellyn Hack 04/29/18.

## 2018-04-28 NOTE — Telephone Encounter (Signed)
Called pt re: surgical clearance below requested in his behalf.  Left a detailed message that pt will need to be seen before clearance can be granted and to call and that scheduled.

## 2018-04-28 NOTE — Telephone Encounter (Signed)
I have not seen this patient since April 2018.  I cannot do any type of preoperative assessment on somebody who have not seen in almost 2 years.  I saw him once and that was for preoperative evaluation.  His PCP has been following.  If they need a cardiologist just that he is okay for surgery, then he should be scheduled to see either me or an APP.  Glenetta Hew, MD

## 2018-04-29 ENCOUNTER — Encounter: Payer: Self-pay | Admitting: Cardiology

## 2018-04-29 ENCOUNTER — Ambulatory Visit (INDEPENDENT_AMBULATORY_CARE_PROVIDER_SITE_OTHER): Payer: Medicare Other | Admitting: Cardiology

## 2018-04-29 VITALS — BP 122/70 | HR 59 | Ht 71.0 in | Wt 194.4 lb

## 2018-04-29 DIAGNOSIS — Z8679 Personal history of other diseases of the circulatory system: Secondary | ICD-10-CM

## 2018-04-29 DIAGNOSIS — I2510 Atherosclerotic heart disease of native coronary artery without angina pectoris: Secondary | ICD-10-CM | POA: Diagnosis not present

## 2018-04-29 DIAGNOSIS — Z01810 Encounter for preprocedural cardiovascular examination: Secondary | ICD-10-CM

## 2018-04-29 DIAGNOSIS — Z9861 Coronary angioplasty status: Secondary | ICD-10-CM

## 2018-04-29 NOTE — Telephone Encounter (Signed)
Letta Moynahan - we aren't asking you to clear him. It's just pre-emptive input on holding the ASA if his pre-op clearance visit goes well. (The APPs collectively commented they were seeing people for pre-op clearance then still having to wait on MD to clear to come off ASA/Plavix/Brilinta etc so now we send the chart to the MD for input on holding the medicine provided the visit goes well, as below.) Thanks as always for your help. Dayna Dunn PA-C

## 2018-04-29 NOTE — Progress Notes (Signed)
PCP: Lajean Manes, MD  Surgeon: Dr. Armandina Gemma [facility] ([facility])  Clinic Note: Chief Complaint  Patient presents with  . Pre-op Exam    No complaints  . Coronary Artery Disease    No angina or heart failure    HPI: Ian [name] is a 73 y.o. male with a Distant PMH of CAD-PCI & reported h/o Afib who is being seen today for delayed f/u & Pre-operative Cardiovascular Examination -at the request of Dr. Lennette Bihari supple for planned shoulder surgery.  Ian [name] has a distant history of known coronary disease status post PCI to the first diagonal branch back in 2004. He then had a follow-up catheterization in 2013 showed the stent was open with 30% stenosis in the LAD and diagonal with an EF of roughly 45%. He was then discharged from cardiology and has been followed by his primary care physician since.  I saw Ian [name] for preop evaluation in April 2018 for upcoming inguinal hernia surgery.  -->  He did not follow back up again because it was thought that he was moving to [place] following his surgery.  He has not yet moved. -->  At that time, he was totally asymptomatic from a cardiac standpoint.  NO ACTIVE ANGINA OR HEART FAILURE SYMPTOMS -> thought to be low risk.  No further testing.  Tolerated surgery well.  Recent Hospitalizations: n/a  Studies Reviewed: reports & films reviewed personally  No new studies  Interval History: Ian [name] presents today for preoperative risk evaluation for left shoulder Sgx. As was the case of last visit, he really has been stable from a cardiac standpoint.  He does yardwork several days out of the week and exercises 2 to 3 days a week doing his stationary bicycle and then walks a mile or 2 a day most days of the week.  He can walk a mile in 20 minutes.  He denies any chest tightness pressure with rest or exertion.  No heart failure symptoms of PND, orthopnea or edema (other than left lower leg swelling -- He has chronic left leg swelling more so  than his right.  This is been going on for years but with no true venous stasis findings of discomfort.  This is not cardiac related.).  He does have some left arm swelling and pain from his shoulder.  He is easily able to exercise close to 10 METS.  He eats healthy and has a staying stable diet.  He is on a good cardiac regimen.  He has not had any rapid irregular heartbeats palpitations.  No syncope/near syncope or TIA/amaurosis fugax.  No claudication.   He is actually in the process of planning to move to [place] for retirement and will likely not still be here in [place] for annual follow-up next year.   ROS: A comprehensive was performed. Review of Systems  Constitutional: Negative for fever, malaise/fatigue and weight loss (Intentional).  HENT: Negative for congestion, hearing loss and nosebleeds.   Respiratory: Negative for cough, shortness of breath and wheezing.   Cardiovascular: Positive for leg swelling (Left lower leg).  Gastrointestinal: Negative for blood in stool, heartburn and melena.  Genitourinary: Negative for hematuria.       Left inguinal soreness from his hernia  Musculoskeletal: Positive for joint pain (Left shoulder and upper arm pain). Negative for falls and myalgias.       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 He therefore cannot do extensive walking, still  exercises.  Neurological: Negative for dizziness, tingling and focal weakness.  Endo/Heme/Allergies: Negative for environmental allergies.  Psychiatric/Behavioral: Negative.   All other systems reviewed and are negative.   Past Medical History:  Diagnosis Date  . Arthritis   . Atherosclerosis of aorta ([place])   . CAD S/P percutaneous coronary angioplasty 06/2002   Cath subtotal occlusion of D1 -- staged PCI with Taxus DES 2.5 x 12; 08/2011: Cath for persistent CP despite normal Myoview -> 30% pLAD & pD1 with patent D1 stent.  EF ~45%.  . Cancer ([place])    skin cancer  . Diverticulosis    . Dysrhythmia   . GERD (gastroesophageal reflux disease)   . History of kidney stones   . HTN (hypertension)   . Hypercholesterolemia   . Lung nodule    rt lower lobe  . Migraine   . PAF (paroxysmal atrial fibrillation) ([place]) 2011   seen on EKG  . Thrombocytopenia ([place])   . Thyroid cyst   . Urine findings abnormal    positive protein  . Varicose veins    ejection fraction 48% 06/2010    Past Surgical History:  Procedure Laterality Date  . CERVICAL FUSION  1995; ~2008   Twice  . CORONARY STENT INTERVENTION  06/28/2002   DIAG1 PCI: Taxus DES 2.5 x 12   . diagonal stent     DIAG1 DES 06/28/02  . INGUINAL HERNIA REPAIR Left 08/08/2016   Procedure: OPEN LEFT INGUINAL HERNIA REPAIR WITH MESH;  Surgeon: Armandina Gemma, MD;  Location: [facility];  Service: General;  Laterality: Left;  . INSERTION OF MESH Left 08/08/2016   Procedure: INSERTION OF MESH;  Surgeon: Armandina Gemma, MD;  Location: [facility];  Service: General;  Laterality: Left;  . LEFT HEART CATHETERIZATION WITH CORONARY ANGIOGRAM N/A 11/07/2011   Procedure: LEFT HEART CATHETERIZATION WITH CORONARY ANGIOGRAM;  Surgeon: Candee Furbish, MD;  Location: [facility];  Service: Cardiovascular: - Equivocal persistent symptoms despite nonischemic Myoview. EF ~45%. 30% calcified pLAD, 30% D1 prestent. Widely paent D1 DES stent.  . [place], 2010, 2011   Total 3 surgeries  . REVERSE SHOULDER ARTHROPLASTY Right 11/14/2017  . REVERSE SHOULDER ARTHROPLASTY Right 11/13/2017   Procedure: REVERSE RIGHT SHOULDER ARTHROPLASTY;  Surgeon: Justice Britain, MD;  Location: [place];  Service: Orthopedics;  Laterality: Right;  . TRIGGER FINGER RELEASE      Current Meds  Medication Sig  . aspirin EC 81 MG tablet Take 81 mg by mouth at bedtime.   . Calcium Carb-Cholecalciferol (CALCIUM 600+D) 600-800 MG-UNIT TABS Take 1 tablet by mouth daily.   . cetirizine (ZYRTEC) 10 MG tablet Take 10 mg by mouth at bedtime.   . Coenzyme Q10 (COQ10) 400 MG CAPS  Take 400 mg by mouth daily.   Marland Kitchen esomeprazole (NEXIUM) 20 MG capsule Take 20 mg by mouth every Monday, Wednesday, and Friday. In the morning  . Eyelid Cleansers (AVENOVA) 0.01 % SOLN Place 1 application into both eyes 2 (two) times daily.  . folic acid (FOLVITE) 494 MCG tablet Take 400 mcg by mouth daily.  Marland Kitchen gabapentin (NEURONTIN) 100 MG capsule Take 200 mg by mouth every 6 (six) hours.  . Glucosamine HCl-MSM (GLUCOSAMINE-MSM PO) Take 1 tablet by mouth daily.   Marland Kitchen HYDROcodone-acetaminophen (NORCO) 5-325 MG tablet Take 1-2 tablets by mouth every 4 (four) hours as needed.  Marland Kitchen ibuprofen (ADVIL,MOTRIN) 800 MG tablet Take 1 tablet (800 mg total) by mouth every 8 (eight) hours as needed.  Marland Kitchen lisinopril (  PRINIVIL,ZESTRIL) 20 MG tablet Take 20 mg by mouth at bedtime.   . methocarbamol (ROBAXIN) 500 MG tablet Take 1 tablet (500 mg total) by mouth every 6 (six) hours as needed for muscle spasms.  . Misc Natural Products (TART CHERRY ADVANCED) CAPS Take 2 capsules by mouth daily.   . Multiple Vitamins-Minerals (OCUVITE ADULT 50+) CAPS Take 1 capsule by mouth daily.  . Riboflavin 100 MG TABS Take 400 mg by mouth daily.  . sildenafil (VIAGRA) 50 MG tablet Take 50 mg by mouth daily as needed for erectile dysfunction.  . vitamin B-12 (CYANOCOBALAMIN) 1000 MCG tablet Take 1,000 mcg by mouth daily.  . vitamin C (ASCORBIC ACID) 500 MG tablet Take 1,000 mg by mouth daily.   Marland Kitchen XIIDRA 5 % SOLN Place 1 drop into both eyes 2 (two) times daily as needed for dry eyes.    Allergies  Allergen Reactions  . Adhesive [Tape] Rash    Band aid adhesive ---- RASH Skin peels off  . Codeine Nausea And Vomiting  . Demerol [Meperidine] Nausea And Vomiting  . Lescol [Fluvastatin Sodium] Other (See Comments)    Muscle pain   . Morphine And Related Nausea And Vomiting  . Oxycodone Nausea And Vomiting  . Penicillins Nausea And Vomiting    Has patient had a PCN reaction causing immediate rash, facial/tongue/throat swelling, SOB or  lightheadedness with hypotension: No Has patient had a PCN reaction causing severe rash involving mucus membranes or skin necrosis: No Has patient had a PCN reaction that required hospitalization No Has patient had a PCN reaction occurring within the last 10 years: No If all of the above answers are "NO", then may proceed with Cephalosporin use.   Christy Sartorius Sodium] Other (See Comments)    Muscle pain   . Welchol [Colesevelam Hcl] Other (See Comments)    Weakness   . Zetia [Ezetimibe] Other (See Comments)    Muscle pain    Social History   Tobacco Use  . Smoking status: Never Smoker  . Smokeless tobacco: Never Used  Substance Use Topics  . Alcohol use: No  . Drug use: No   Social History   Social History Narrative   He is a married father of 2, grandfather of 78. He currently lives with his wife. 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 He is active, but does not do routine exercise. He does most yard work and is able to walk in the stores etc.   family history includes Cancer in his sister; Cancer - Prostate in his brother; Cervical cancer in his maternal grandmother; Coronary artery disease in his father; Dementia in his mother; Esophageal cancer in his father; Heart attack (age of onset: 75) in his maternal grandfather; Hypertension in his mother.  Wt Readings from Last 3 Encounters:  04/29/18 194 lb 6.4 oz (88.2 kg)  11/13/17 180 lb 8 oz (81.9 kg)  08/08/16 191 lb (86.6 kg)    PHYSICAL EXAM BP 122/70   Pulse (!) 59   Ht 5\' 11"  (1.803 m)   Wt 194 lb 6.4 oz (88.2 kg)   BMI 27.11 kg/m   Physical Exam  Constitutional: He is oriented to person, place, and time. He appears well-developed. No distress.  Healthy-appearing.  Well-nourished and well-groomed.  HENT:  Head: Normocephalic and atraumatic.  Neck: Neck supple. No hepatojugular reflux and no JVD present. Carotid bruit is not present.  Cardiovascular: Normal rate,  regular rhythm, normal heart  sounds and intact distal pulses.  No extrasystoles are present. PMI is not displaced. Exam reveals no gallop and no friction rub.  No murmur heard. Pulmonary/Chest: Breath sounds normal. No respiratory distress. He has no wheezes. He has no rales.  Abdominal: Soft. Bowel sounds are normal. He exhibits no distension. There is no abdominal tenderness. There is no rebound.  Musculoskeletal: Normal range of motion.        General: Edema (Left> right lower leg swelling.  No venous stasis changes.) present.     Comments: L>R plump varicose veins.  Non-tender.   Neurological: He is alert and oriented to person, place, and time.  Psychiatric: He has a normal mood and affect. His behavior is normal. Judgment and thought content normal.  Vitals reviewed.   Adult ECG Report - no EKG from today. Rate: 59;  Rhythm: sinus bradycardia and Normal axis, intervals and durations.;  Borderline IVCD, poor R wave progression.  Narrative Interpretation: Relatively stable.  (EKG from last August looks very different, but no concerning differences)  Other studies Reviewed: Additional studies/ records that were reviewed today include:  Recent Labs:  Labs are followed by PCP No results found for: CHOL, HDL, LDLCALC, LDLDIRECT, TRIG, CHOLHDL  ASSESSMENT / PLAN: Problem List Items Addressed This Visit    CAD S/P percutaneous coronary angioplasty (Chronic)    Last cath was in 2013 was stable disease.  Patent stent. No active angina.  Remains on aspirin which can be held 5 to 7 days preop.  Is on lisinopril. Not on a beta-blocker because of resting bradycardia. Also not on statin, history of intolerance per his report.  Able to exercise at least 8-10 METS: No need for ischemic evaluation.  He indicated that when he had his angioplasty, he was quite symptomatic and he has not had anything like that since.      Relevant Orders   EKG 12-Lead (Completed)   History of atrial fibrillation  without current medication (Chronic)    This patients CHA2DS2-VASc Score and unadjusted Ischemic Stroke Rate (% per year) is equal to 2.2 % stroke rate/year from a score of 2.  However, I have not seen any evidence of A. fib since 2011.  Was never really given a diagnosis of this.  Is not on any rate control agents, and he has not had any sensation of recurrent A. fib.  Would simply monitor, but no treatment at this point.  Would not anticoagulate unless there is recurrence.       Relevant Orders   EKG 12-Lead (Completed)   Preoperative cardiovascular examination - Primary    PREOPERATIVE CARDIAC RISK ASSESSMENT  --Revised Cardiac Risk Index score is low risk for low risk surgery. As far as cardiac clearance, in the absence of any active angina or heart failure symptoms, no requirement for ischemic evaluation. Would recommend proceeding to the OR without any further evaluation.  Okay to hold aspirin for 5 to 7 days preop.  Revised Cardiac Risk Index:  High Risk Surgery: no; shoulder surgery is low risk from a cardiac standpoint  Defined as Intraperitoneal, intrathoracic or suprainguinal vascular  Active CAD: no; no angina since PCI in 2004  CHF: no; despite LV gram showing mildly reduced EF, no signs or symptoms of heart failure.  Cerebrovascular Disease: no;   Diabetes: no; On Insulin: n/aN/A  CKD (Cr >~ 2): no;   Total: 0 Estimated Risk of Adverse Outcome: LOW RISK Estimated Risk of MI, PE, VF/VT (Cardiac Arrest), Complete Heart Block: <1 %  ACC/AHA Guidelines for "Clearance":  Step 1 - Need for Emergency Surgery: No: Scheduled/elective  If Yes - go straight to OR with perioperative surveillance  Step 2 - Active Cardiac Conditions (Unstable Angina, Decompensated HF, Significant  Arrhytmias - Complete HB, Mobitz II, Symptomatic VT or SVT, Severe Aortic Stenosis - mean gradient > 40 mmHg, Valve area < 1.0 cm2):   No:   If Yes - Evaluate & Treat per ACC/AHA  Guidelines  Step 3 -  Low Risk Surgery: Yes  If Yes --> proceed to OR  If No --> Step 4  Step 4 - Functional Capacity >= 4 METS without symptoms: Yes; 8-10 METS  If Yes --> proceed to OR  If No --> Step 5  Step 5 --  Clinical Risk Factors (CRF)   3 or more: No: No active cardiac symptoms.  (See above)  If Yes -- assess Surgical Risk, --   (High Risk Non-cardiac), Intraabdominal or thoracic vascular surgery consider testing if it will change management.  Intermediate Risk: Proceed to OR with HR control, or consider testing if it will change management  1-2 or more CRFs: No  No CRFs: Yes  If Yes --> Proceed to OR        Relevant Orders   EKG 12-Lead (Completed)     Since he will be moving to [place], I don't think he needs to follow-up with [place] from a cardiology standpoint as he has been stable. Continue to manage his blood sugars lipids and blood pressure.   Current medicines are reviewed at length with the patient today. (+/- concerns) n/a The following changes have been made: n/a  Patient Instructions      Medication Instructions:  NOT NEEDED If you need a refill on your cardiac medications before your next appointment, please call your pharmacy.   Lab work: Not needed If you have labs (blood work) drawn today and your tests are completely normal, you will receive your results only by: Marland Kitchen MyChart Message (if you have MyChart) OR . A paper copy in the mail If you have any lab test that is abnormal or we need to change your treatment, we will call you to review the results.  Testing/Procedures: Not needed  Follow-Up: At [facility], you and your health needs are our priority.  As part of our continuing mission to provide you with exceptional heart care, we have created designated Provider Care Teams.  These Care Teams include your primary Cardiologist (physician) and Advanced Practice Providers (APPs -  Physician Assistants and Nurse Practitioners) who  all work together to provide you with the care you need, when you need it. You will need a follow up appointment in 12 months JAN 2021.  Please call our office 2 months in advance to schedule this appointment.  You may see Glenetta Hew, MD or one of the following Advanced Practice Providers on your designated Care Team:   Rosaria Ferries, PA-C . Jory Sims, DNP, ANP  Any Other Special Instructions Will Be Listed Below (If Applicable).  OKAY TO HAVE  SHOULDER SURGERY  OKAY TO HOLD ASPIRIN FOR 5-7 DAYS   Studies Ordered:   Orders Placed This Encounter  Procedures  . EKG 12-Lead      Glenetta Hew, M.D., M.S. Interventional Cardiologist   Pager # [phone] Phone # [phone] [address] [place], [place] [address]

## 2018-04-29 NOTE — Telephone Encounter (Signed)
OK ° °DH °

## 2018-04-29 NOTE — Patient Instructions (Addendum)
    Medication Instructions:  NOT NEEDED If you need a refill on your cardiac medications before your next appointment, please call your pharmacy.   Lab work: Not needed If you have labs (blood work) drawn today and your tests are completely normal, you will receive your results only by: Marland Kitchen MyChart Message (if you have MyChart) OR . A paper copy in the mail If you have any lab test that is abnormal or we need to change your treatment, we will call you to review the results.  Testing/Procedures: Not needed  Follow-Up: At [facility], you and your health needs are our priority.  As part of our continuing mission to provide you with exceptional heart care, we have created designated Provider Care Teams.  These Care Teams include your primary Cardiologist (physician) and Advanced Practice Providers (APPs -  Physician Assistants and Nurse Practitioners) who all work together to provide you with the care you need, when you need it. You will need a follow up appointment in 12 months JAN 2021.  Please call our office 2 months in advance to schedule this appointment.  You may see Glenetta Hew, MD or one of the following Advanced Practice Providers on your designated Care Team:   Rosaria Ferries, PA-C . Jory Sims, DNP, ANP  Any Other Special Instructions Will Be Listed Below (If Applicable).  OKAY TO HAVE  SHOULDER SURGERY  OKAY TO HOLD ASPIRIN FOR 5-7 DAYS

## 2018-05-02 ENCOUNTER — Encounter: Payer: Self-pay | Admitting: Cardiology

## 2018-05-02 NOTE — Assessment & Plan Note (Signed)
PREOPERATIVE CARDIAC RISK ASSESSMENT  --Revised Cardiac Risk Index score is low risk for low risk surgery. As far as cardiac clearance, in the absence of any active angina or heart failure symptoms, no requirement for ischemic evaluation. Would recommend proceeding to the OR without any further evaluation.  Okay to hold aspirin for 5 to 7 days preop.  Revised Cardiac Risk Index:  High Risk Surgery: no; shoulder surgery is low risk from a cardiac standpoint  Defined as Intraperitoneal, intrathoracic or suprainguinal vascular  Active CAD: no; no angina since PCI in 2004  CHF: no; despite LV gram showing mildly reduced EF, no signs or symptoms of heart failure.  Cerebrovascular Disease: no;   Diabetes: no; On Insulin: n/aN/A  CKD (Cr >~ 2): no;   Total: 0 Estimated Risk of Adverse Outcome: LOW RISK Estimated Risk of MI, PE, VF/VT (Cardiac Arrest), Complete Heart Block: <1 %   ACC/AHA Guidelines for "Clearance":  Step 1 - Need for Emergency Surgery: No: Scheduled/elective  If Yes - go straight to OR with perioperative surveillance  Step 2 - Active Cardiac Conditions (Unstable Angina, Decompensated HF, Significant  Arrhytmias - Complete HB, Mobitz II, Symptomatic VT or SVT, Severe Aortic Stenosis - mean gradient > 40 mmHg, Valve area < 1.0 cm2):   No:   If Yes - Evaluate & Treat per ACC/AHA Guidelines  Step 3 -  Low Risk Surgery: Yes  If Yes --> proceed to OR  If No --> Step 4  Step 4 - Functional Capacity >= 4 METS without symptoms: Yes; 8-10 METS  If Yes --> proceed to OR  If No --> Step 5  Step 5 --  Clinical Risk Factors (CRF)   3 or more: No: No active cardiac symptoms.  (See above)  If Yes -- assess Surgical Risk, --   (High Risk Non-cardiac), Intraabdominal or thoracic vascular surgery consider testing if it will change management.  Intermediate Risk: Proceed to OR with HR control, or consider testing if it will change management  1-2 or more CRFs:  No  No CRFs: Yes  If Yes --> Proceed to OR

## 2018-05-02 NOTE — Assessment & Plan Note (Signed)
This patients CHA2DS2-VASc Score and unadjusted Ischemic Stroke Rate (% per year) is equal to 2.2 % stroke rate/year from a score of 2.  However, I have not seen any evidence of A. fib since 2011.  Was never really given a diagnosis of this.  Is not on any rate control agents, and he has not had any sensation of recurrent A. fib.  Would simply monitor, but no treatment at this point.  Would not anticoagulate unless there is recurrence.

## 2018-05-02 NOTE — Assessment & Plan Note (Signed)
Last cath was in 2013 was stable disease.  Patent stent. No active angina.  Remains on aspirin which can be held 5 to 7 days preop.  Is on lisinopril. Not on a beta-blocker because of resting bradycardia. Also not on statin, history of intolerance per his report.  Able to exercise at least 8-10 METS: No need for ischemic evaluation.  He indicated that when he had his angioplasty, he was quite symptomatic and he has not had anything like that since.

## 2018-05-07 NOTE — Patient Instructions (Addendum)
Ian [name]  03/15/1946    Your procedure is scheduled on:  05-14-2018   Report to [facility] Main  Entrance,  Report to admitting at  10:30 AM    Call this number if you have problems the morning of surgery [phone]      Remember: Do not eat food or drink liquids :After Midnight.                                       BRUSH YOUR TEETH MORNING OF SURGERY AND RINSE YOUR MOUTH OUT, NO CHEWING GUM CANDY OR MINTS.        Take these medicines the morning of surgery with A SIP OF WATER:    Nexium, Hydrocodone if needed,  Methocarbamol if needed                                   You may not have any metal on your body including hair pins and               piercings  Do not wear jewelry, make-up, lotions, powders or perfumes, deodorant                          Men may shave face and neck.      Do not bring valuables to the hospital. [place].  Contacts, dentures or bridgework may not be worn into surgery.  Leave suitcase in the car. After surgery it may be brought to your room.     Patients discharged the day of surgery will not be allowed to drive home. IF YOU ARE HAVING SURGERY AND GOING HOME THE SAME DAY, YOU MUST HAVE AN ADULT TO DRIVE YOU HOME AND BE WITH YOU FOR 24 HOURS. YOU MAY GO HOME BY TAXI OR UBER OR ORTHERWISE, BUT AN ADULT MUST ACCOMPANY YOU HOME AND STAY WITH YOU FOR 24 HOURS.    Name and phone number of your driver:  Wife-- Ian [name]  [phone]     _____________________________________________________________________             [facility] - Preparing for Surgery Before surgery, you can play an important role.  Because skin is not sterile, your skin needs to be as free of germs as possible.  You can reduce the number of germs on your skin by washing with CHG (chlorahexidine gluconate) soap before surgery.  CHG is an antiseptic cleaner which kills germs and bonds with the  skin to continue killing germs even after washing. Please DO NOT use if you have an allergy to CHG or antibacterial soaps.  If your skin becomes reddened/irritated stop using the CHG and inform your nurse when you arrive at Short Stay. Do not shave (including legs and underarms) for at least 48 hours prior to the first CHG shower.  You may shave your face/neck. Please follow these instructions carefully:  1.  Shower with CHG Soap the night before surgery and the  morning of Surgery.  2.  If you choose to wash your hair, wash your hair first as usual with your  normal  shampoo.  3.  After you shampoo, rinse your hair and body thoroughly to remove the  shampoo.                            4.  Use CHG as you would any other liquid soap.  You can apply chg directly  to the skin and wash                       Gently with a scrungie or clean washcloth.  5.  Apply the CHG Soap to your body ONLY FROM THE NECK DOWN.   Do not use on face/ open                           Wound or open sores. Avoid contact with eyes, ears mouth and genitals (private parts).                       Wash face,  Genitals (private parts) with your normal soap.             6.  Wash thoroughly, paying special attention to the area where your surgery  will be performed.  7.  Thoroughly rinse your body with warm water from the neck down.  8.  DO NOT shower/wash with your normal soap after using and rinsing off  the CHG Soap.             9.  Pat yourself dry with a clean towel.            10.  Wear clean pajamas.            11.  Place clean sheets on your bed the night of your first shower and do not  sleep with pets. Day of Surgery : Do not apply any lotions/deodorants the morning of surgery.  Please wear clean clothes to the hospital/surgery center.  FAILURE TO FOLLOW THESE INSTRUCTIONS MAY RESULT IN THE CANCELLATION OF YOUR SURGERY PATIENT SIGNATURE_________________________________  NURSE  SIGNATURE__________________________________  ________________________________________________________________________   Adam Phenix  An incentive spirometer is a tool that can help keep your lungs clear and active. This tool measures how well you are filling your lungs with each breath. Taking long deep breaths may help reverse or decrease the chance of developing breathing (pulmonary) problems (especially infection) following:  A long period of time when you are unable to move or be active. BEFORE THE PROCEDURE   If the spirometer includes an indicator to show your best effort, your nurse or respiratory therapist will set it to a desired goal.  If possible, sit up straight or lean slightly forward. Try not to slouch.  Hold the incentive spirometer in an upright position. INSTRUCTIONS FOR USE  1. Sit on the edge of your bed if possible, or sit up as far as you can in bed or on a chair. 2. Hold the incentive spirometer in an upright position. 3. Breathe out normally. 4. Place the mouthpiece in your mouth and seal your lips tightly around it. 5. Breathe in slowly and as deeply as possible, raising the piston or the ball toward the top of the column. 6. Hold your breath for 3-5 seconds or for as long as possible. Allow the piston or ball to fall to the bottom of the column. 7. Remove the mouthpiece from your mouth and breathe out normally. 8. Rest for a  few seconds and repeat Steps 1 through 7 at least 10 times every 1-2 hours when you are awake. Take your time and take a few normal breaths between deep breaths. 9. The spirometer may include an indicator to show your best effort. Use the indicator as a goal to work toward during each repetition. 10. After each set of 10 deep breaths, practice coughing to be sure your lungs are clear. If you have an incision (the cut made at the time of surgery), support your incision when coughing by placing a pillow or rolled up towels firmly  against it. Once you are able to get out of bed, walk around indoors and cough well. You may stop using the incentive spirometer when instructed by your caregiver.  RISKS AND COMPLICATIONS  Take your time so you do not get dizzy or light-headed.  If you are in pain, you may need to take or ask for pain medication before doing incentive spirometry. It is harder to take a deep breath if you are having pain. AFTER USE  Rest and breathe slowly and easily.  It can be helpful to keep track of a log of your progress. Your caregiver can provide you with a simple table to help with this. If you are using the spirometer at home, follow these instructions: [place] IF:   You are having difficultly using the spirometer.  You have trouble using the spirometer as often as instructed.  Your pain medication is not giving enough relief while using the spirometer.  You develop fever of 100.5 F (38.1 C) or higher. SEEK IMMEDIATE MEDICAL CARE IF:   You cough up bloody sputum that had not been present before.  You develop fever of 102 F (38.9 C) or greater.  You develop worsening pain at or near the incision site. MAKE SURE YOU:   Understand these instructions.  Will watch your condition.  Will get help right away if you are not doing well or get worse. Document Released: 08/12/2006 Document Revised: 06/24/2011 Document Reviewed: 10/13/2006 [facility] Patient Information 2014 [place], [place].   ________________________________________________________________________

## 2018-05-08 DIAGNOSIS — Z471 Aftercare following joint replacement surgery: Secondary | ICD-10-CM | POA: Diagnosis not present

## 2018-05-08 DIAGNOSIS — Z96611 Presence of right artificial shoulder joint: Secondary | ICD-10-CM | POA: Diagnosis not present

## 2018-05-11 ENCOUNTER — Encounter (HOSPITAL_COMMUNITY)
Admission: RE | Admit: 2018-05-11 | Discharge: 2018-05-11 | Disposition: A | Discharge status: Home / Self Care | Payer: Medicare Other | Source: Ambulatory Visit | Attending: Orthopedic Surgery | Admitting: Orthopedic Surgery

## 2018-05-11 ENCOUNTER — Other Ambulatory Visit: Payer: Self-pay

## 2018-05-11 ENCOUNTER — Encounter (HOSPITAL_COMMUNITY): Payer: Self-pay

## 2018-05-11 DIAGNOSIS — Z87442 Personal history of urinary calculi: Secondary | ICD-10-CM | POA: Diagnosis not present

## 2018-05-11 DIAGNOSIS — Z800 Family history of malignant neoplasm of digestive organs: Secondary | ICD-10-CM | POA: Diagnosis not present

## 2018-05-11 DIAGNOSIS — M75112 Incomplete rotator cuff tear or rupture of left shoulder, not specified as traumatic: Secondary | ICD-10-CM | POA: Diagnosis not present

## 2018-05-11 DIAGNOSIS — Z803 Family history of malignant neoplasm of breast: Secondary | ICD-10-CM | POA: Diagnosis not present

## 2018-05-11 DIAGNOSIS — I10 Essential (primary) hypertension: Secondary | ICD-10-CM | POA: Diagnosis not present

## 2018-05-11 DIAGNOSIS — I4891 Unspecified atrial fibrillation: Secondary | ICD-10-CM | POA: Diagnosis not present

## 2018-05-11 DIAGNOSIS — Z85820 Personal history of malignant melanoma of skin: Secondary | ICD-10-CM | POA: Diagnosis not present

## 2018-05-11 DIAGNOSIS — Z820 Family history of epilepsy and other diseases of the nervous system: Secondary | ICD-10-CM | POA: Diagnosis not present

## 2018-05-11 DIAGNOSIS — Z8049 Family history of malignant neoplasm of other genital organs: Secondary | ICD-10-CM | POA: Diagnosis not present

## 2018-05-11 DIAGNOSIS — Z01812 Encounter for preprocedural laboratory examination: Secondary | ICD-10-CM | POA: Insufficient documentation

## 2018-05-11 DIAGNOSIS — M19019 Primary osteoarthritis, unspecified shoulder: Secondary | ICD-10-CM | POA: Insufficient documentation

## 2018-05-11 DIAGNOSIS — M19012 Primary osteoarthritis, left shoulder: Secondary | ICD-10-CM | POA: Diagnosis not present

## 2018-05-11 DIAGNOSIS — K219 Gastro-esophageal reflux disease without esophagitis: Secondary | ICD-10-CM | POA: Diagnosis not present

## 2018-05-11 DIAGNOSIS — Z8042 Family history of malignant neoplasm of prostate: Secondary | ICD-10-CM | POA: Diagnosis not present

## 2018-05-11 DIAGNOSIS — R911 Solitary pulmonary nodule: Secondary | ICD-10-CM | POA: Diagnosis not present

## 2018-05-11 DIAGNOSIS — M25812 Other specified joint disorders, left shoulder: Secondary | ICD-10-CM | POA: Insufficient documentation

## 2018-05-11 DIAGNOSIS — Z955 Presence of coronary angioplasty implant and graft: Secondary | ICD-10-CM | POA: Diagnosis not present

## 2018-05-11 DIAGNOSIS — M7542 Impingement syndrome of left shoulder: Secondary | ICD-10-CM | POA: Diagnosis not present

## 2018-05-11 DIAGNOSIS — H9193 Unspecified hearing loss, bilateral: Secondary | ICD-10-CM | POA: Diagnosis not present

## 2018-05-11 DIAGNOSIS — Z981 Arthrodesis status: Secondary | ICD-10-CM | POA: Diagnosis not present

## 2018-05-11 DIAGNOSIS — Z8249 Family history of ischemic heart disease and other diseases of the circulatory system: Secondary | ICD-10-CM | POA: Diagnosis not present

## 2018-05-11 DIAGNOSIS — I2510 Atherosclerotic heart disease of native coronary artery without angina pectoris: Secondary | ICD-10-CM | POA: Diagnosis not present

## 2018-05-11 DIAGNOSIS — Z96611 Presence of right artificial shoulder joint: Secondary | ICD-10-CM | POA: Diagnosis not present

## 2018-05-11 DIAGNOSIS — M7502 Adhesive capsulitis of left shoulder: Secondary | ICD-10-CM | POA: Diagnosis not present

## 2018-05-11 DIAGNOSIS — E7800 Pure hypercholesterolemia, unspecified: Secondary | ICD-10-CM | POA: Diagnosis not present

## 2018-05-11 DIAGNOSIS — X58XXXA Exposure to other specified factors, initial encounter: Secondary | ICD-10-CM | POA: Diagnosis not present

## 2018-05-11 DIAGNOSIS — S43432A Superior glenoid labrum lesion of left shoulder, initial encounter: Secondary | ICD-10-CM | POA: Diagnosis not present

## 2018-05-11 DIAGNOSIS — I700 Atherosclerosis of aorta: Secondary | ICD-10-CM | POA: Diagnosis not present

## 2018-05-11 HISTORY — DX: Personal history of malignant melanoma of skin: Z85.820

## 2018-05-11 HISTORY — DX: Presence of coronary angioplasty implant and graft: Z95.5

## 2018-05-11 HISTORY — DX: Presence of external hearing-aid: Z97.4

## 2018-05-11 HISTORY — DX: Other specified postprocedural states: Z85.828

## 2018-05-11 HISTORY — DX: Cyst of kidney, acquired: N28.1

## 2018-05-11 HISTORY — DX: Presence of spectacles and contact lenses: Z97.3

## 2018-05-11 HISTORY — DX: Other specified postprocedural states: Z98.890

## 2018-05-11 HISTORY — DX: Personal history of other diseases of the circulatory system: Z86.79

## 2018-05-11 LAB — CBC
HEMATOCRIT: 44.3 % (ref 39.0–52.0)
Hemoglobin: 13.9 g/dL (ref 13.0–17.0)
MCH: 28.9 pg (ref 26.0–34.0)
MCHC: 31.4 g/dL (ref 30.0–36.0)
MCV: 92.1 fL (ref 80.0–100.0)
NRBC: 0 % (ref 0.0–0.2)
Platelets: 162 10*3/uL (ref 150–400)
RBC: 4.81 MIL/uL (ref 4.22–5.81)
RDW: 13.4 % (ref 11.5–15.5)
WBC: 5.7 10*3/uL (ref 4.0–10.5)

## 2018-05-11 LAB — BASIC METABOLIC PANEL
ANION GAP: 4 — AB (ref 5–15)
BUN: 14 mg/dL (ref 8–23)
CO2: 30 mmol/L (ref 22–32)
Calcium: 9.2 mg/dL (ref 8.9–10.3)
Chloride: 108 mmol/L (ref 98–111)
Creatinine, Ser: 0.78 mg/dL (ref 0.61–1.24)
GFR calc Af Amer: >60 mL/min (ref >60)
GFR calc non Af Amer: >60 mL/min (ref >60)
Glucose, Bld: 92 mg/dL (ref 70–99)
POTASSIUM: 4.6 mmol/L (ref 3.5–5.1)
Sodium: 142 mmol/L (ref 135–145)

## 2018-05-11 NOTE — Progress Notes (Signed)
EKG dated 04-29-2018 in epic.  CXR dated 09-05-2017 in epic.  Cardiology clearance letter dated 04-29-2018 in epic and chart.  [place] cardiologist note dated 04-29-2018 in epic,  Dr harding.

## 2018-05-13 NOTE — Anesthesia Preprocedure Evaluation (Addendum)
Anesthesia Evaluation  Patient identified by MRN, date of birth, ID band Patient awake    Reviewed: Allergy & Precautions, NPO status , Patient's Chart, lab work & pertinent test results  Airway Mallampati: II  TM Distance: >3 FB Neck ROM: Full    Dental  (+) Dental Advisory Given   Pulmonary neg pulmonary ROS,    Pulmonary exam normal breath sounds clear to auscultation       Cardiovascular hypertension, + CAD and + Cardiac Stents  Normal cardiovascular exam Rhythm:Regular Rate:Normal     Neuro/Psych negative neurological ROS  negative psych ROS   GI/Hepatic Neg liver ROS, GERD  ,  Endo/Other  negative endocrine ROS  Renal/GU Renal disease     Musculoskeletal  (+) Arthritis ,   Abdominal   Peds  Hematology negative hematology ROS (+)   Anesthesia Other Findings   Reproductive/Obstetrics                                                              Anesthesia Evaluation  Patient identified by MRN, date of birth, ID band Patient awake    Reviewed: Allergy & Precautions, NPO status , Patient's Chart, lab work & pertinent test results, Unable to perform ROS - Chart review only  Airway Mallampati: II  TM Distance: >3 FB Neck ROM: Full    Dental no notable dental hx. (+) Teeth Intact   Pulmonary neg pulmonary ROS,  Bilateral pulmonary nodules   Pulmonary exam normal breath sounds clear to auscultation       Cardiovascular hypertension, Pt. on medications + CAD, + Cardiac Stents and + Peripheral Vascular Disease  Normal cardiovascular exam+ dysrhythmias Atrial Fibrillation  Rhythm:Regular Rate:Bradycardia  A Fib 2011 Stent x 1 LAD- patent last cath   Neuro/Psych  Headaches, negative psych ROS   GI/Hepatic GERD  Medicated and Controlled,Diverticulosis   Endo/Other  Hypercholesterolemia  Renal/GU Renal diseaseHx/o renal calculi   ED     Musculoskeletal  (+) Arthritis , Left inguinal hernia S/P Cervical fusion Right shoulder rotator cuff tear with arthropathy   Abdominal   Peds  Hematology Hx/o Thrombocytopenia   Anesthesia Other Findings   Reproductive/Obstetrics                               Chemistry      Component Value Date/Time   NA 142 05/11/2018 1112   K 4.6 05/11/2018 1112   CL 108 05/11/2018 1112   CO2 30 05/11/2018 1112   BUN 14 05/11/2018 1112   CREATININE 0.78 05/11/2018 1112      Component Value Date/Time   CALCIUM 9.2 05/11/2018 1112   ALKPHOS 55 07/14/2009 1117   AST 26 07/14/2009 1117   ALT 23 07/14/2009 1117   BILITOT 0.8 07/14/2009 1117     Lab Results  Component Value Date   WBC 5.7 05/11/2018   HGB 13.9 05/11/2018   HCT 44.3 05/11/2018   MCV 92.1 05/11/2018   PLT 162 05/11/2018   EKG: sinus bradycardia, 1st deg AV Block.  ANGIOGRAPHIC DATA: - Left main: No angiographically significant disease. Branches into LAD and circumflex. - Left anterior descending (LAD): Proximal calcification noted. There is minor stenosis, 30% in the proximal LAD. The previously placed diagonal stent  is widely patent. Just prior to the stent there is approximately a 30% stenosis. Nonflow limiting. - Circumflex artery (CIRC): 2 obtuse marginal branches, widely patent with no angiographically significant disease. - Right coronary artery (RCA): Large dominant vessel giving rise to posterior descending artery with only minor luminal irregularities. LEFT VENTRICULOGRAM:Left ventricular angiogram was done in the 30 RAO projection and revealed mildly reduced contractile performance with an estimated ejection fraction of 45 %. Prior ejection fraction was 48% on nuclear stress test. IMPRESSIONS: 1. Previously placed diagonal stent is widely patent. Minor stenosis in the proximal LAD, proximal to the stent with no flow limiting disease. Proximal LAD calcification noted. 2. Mildly  decreased left ventricular systolic function. LVEDP 15 mmHg. Ejection fraction 45 %.   Anesthesia Physical  Anesthesia Plan  ASA: III  Anesthesia Plan: General   Post-op Pain Management:  Regional for Post-op pain   Induction: Intravenous  PONV Risk Score and Plan:   Airway Management Planned: Oral ETT  Additional Equipment:   Intra-op Plan:   Post-operative Plan: Extubation in OR  Informed Consent: I have reviewed the patients History and Physical, chart, labs and discussed the procedure including the risks, benefits and alternatives for the proposed anesthesia with the patient or authorized representative who has indicated his/her understanding and acceptance.   Dental advisory given  Plan Discussed with: CRNA, Anesthesiologist and Surgeon  Anesthesia Plan Comments:         Anesthesia Quick Evaluation                                   Anesthesia Evaluation  Patient identified by MRN, date of birth, ID band Patient awake    Reviewed: Allergy & Precautions, NPO status , Patient's Chart, lab work & pertinent test results, Unable to perform ROS - Chart review only  Airway Mallampati: II  TM Distance: >3 FB Neck ROM: Full    Dental no notable dental hx. (+) Teeth Intact   Pulmonary neg pulmonary ROS,  Bilateral pulmonary nodules   Pulmonary exam normal breath sounds clear to auscultation       Cardiovascular hypertension, Pt. on medications + CAD, + Cardiac Stents and + Peripheral Vascular Disease  Normal cardiovascular exam+ dysrhythmias Atrial Fibrillation  Rhythm:Regular Rate:Bradycardia  A Fib 2011 Stent x 1 LAD- patent last cath   Neuro/Psych  Headaches, negative psych ROS   GI/Hepatic GERD  Medicated and Controlled,Diverticulosis   Endo/Other  Hypercholesterolemia  Renal/GU Renal diseaseHx/o renal calculi   ED    Musculoskeletal  (+) Arthritis , Left inguinal hernia S/P Cervical fusion Right shoulder rotator cuff  tear with arthropathy   Abdominal   Peds  Hematology Hx/o Thrombocytopenia   Anesthesia Other Findings   Reproductive/Obstetrics                               Chemistry      Component Value Date/Time   NA 142 05/11/2018 1112   K 4.6 05/11/2018 1112   CL 108 05/11/2018 1112   CO2 30 05/11/2018 1112   BUN 14 05/11/2018 1112   CREATININE 0.78 05/11/2018 1112      Component Value Date/Time   CALCIUM 9.2 05/11/2018 1112   ALKPHOS 55 07/14/2009 1117   AST 26 07/14/2009 1117   ALT 23 07/14/2009 1117   BILITOT 0.8 07/14/2009 1117     Lab Results  Component  Value Date   WBC 5.7 05/11/2018   HGB 13.9 05/11/2018   HCT 44.3 05/11/2018   MCV 92.1 05/11/2018   PLT 162 05/11/2018   EKG: sinus bradycardia, 1st deg AV Block.  ANGIOGRAPHIC DATA: - Left main: No angiographically significant disease. Branches into LAD and circumflex. - Left anterior descending (LAD): Proximal calcification noted. There is minor stenosis, 30% in the proximal LAD. The previously placed diagonal stent is widely patent. Just prior to the stent there is approximately a 30% stenosis. Nonflow limiting. - Circumflex artery (CIRC): 2 obtuse marginal branches, widely patent with no angiographically significant disease. - Right coronary artery (RCA): Large dominant vessel giving rise to posterior descending artery with only minor luminal irregularities. LEFT VENTRICULOGRAM:Left ventricular angiogram was done in the 30 RAO projection and revealed mildly reduced contractile performance with an estimated ejection fraction of 45 %. Prior ejection fraction was 48% on nuclear stress test. IMPRESSIONS: 3. Previously placed diagonal stent is widely patent. Minor stenosis in the proximal LAD, proximal to the stent with no flow limiting disease. Proximal LAD calcification noted. 4. Mildly decreased left ventricular systolic function. LVEDP 15 mmHg. Ejection fraction 45 %.   Anesthesia  Physical  Anesthesia Plan  ASA: III  Anesthesia Plan: General   Post-op Pain Management:  Regional for Post-op pain   Induction: Intravenous  PONV Risk Score and Plan:   Airway Management Planned: Oral ETT  Additional Equipment:   Intra-op Plan:   Post-operative Plan: Extubation in OR  Informed Consent: I have reviewed the patients History and Physical, chart, labs and discussed the procedure including the risks, benefits and alternatives for the proposed anesthesia with the patient or authorized representative who has indicated his/her understanding and acceptance.   Dental advisory given  Plan Discussed with: CRNA, Anesthesiologist and Surgeon  Anesthesia Plan Comments:         Anesthesia Quick Evaluation  Anesthesia Physical Anesthesia Plan  ASA: III  Anesthesia Plan: General   Post-op Pain Management: GA combined w/ Regional for post-op pain   Induction: Intravenous  PONV Risk Score and Plan: 2 and Ondansetron, Dexamethasone and Treatment may vary due to age or medical condition  Airway Management Planned: Oral ETT  Additional Equipment: None  Intra-op Plan:   Post-operative Plan: Extubation in OR  Informed Consent: I have reviewed the patients History and Physical, chart, labs and discussed the procedure including the risks, benefits and alternatives for the proposed anesthesia with the patient or authorized representative who has indicated his/her understanding and acceptance.     Dental advisory given  Plan Discussed with: CRNA  Anesthesia Plan Comments: (See PST note 05/11/2018, Konrad Felix, PA-C)       Anesthesia Quick Evaluation

## 2018-05-13 NOTE — Progress Notes (Signed)
Anesthesia Chart Review   Case:  [id number] Date/Time:  05/14/18 1215   Procedure:  SHOULDER ARTHROSCOPY WITH SUBACROMIAL DECOMPRESSION AND DISTAL CLAVICLE EXCISION (Left ) - 4min   Anesthesia type:  General   Pre-op diagnosis:  left shoulder chronic impingement; acromioclavicular osteoarthritis   Location:  [facility] ROOM 07 / [facility]   Surgeon:  Justice Britain, MD      DISCUSSION: 73 yo never smoker with h/o HTN, CAD (s/p DES x1 2004), GERD, remote h/o A-fib, left shoulder impingement, ac OA scheduled for above surgery 05/14/18 with Dr. Justice Britain.   Pt seen by cardiology on 04/29/18, seen by Dr. Glenetta Hew. Per his note, "Revised Cardiac Risk Index score is low risk for low risk surgery.  As far as cardiac clearance, in the absence of any active angina or heart failure symptoms, no requirement for ischemic evaluation.  Would recommend proceeding to the OR without any further evaluation.  Okay to hold aspirin for 5 to 7 days preop."  Pt can proceed with planned procedure barring acute status change.  VS: BP (!) 143/72   Pulse (!) 53   Temp 36.7 C (Oral)   Resp 12   Ht 5\' 11"  (1.803 m)   Wt 84.9 kg   SpO2 99%   BMI 26.12 kg/m   PROVIDERS: Lajean Manes, MD is PCP   Glenetta Hew, MD is Cardiologist  LABS: Labs reviewed: Acceptable for surgery. (all labs ordered are listed, but only abnormal results are displayed)  Labs Reviewed  BASIC METABOLIC PANEL - Abnormal; Notable for the following components:      Result Value   Anion gap 4 (*)    All other components within normal limits  CBC     IMAGES: Chest Xray 09/05/17 FINDINGS: The heart size and mediastinal contours are within normal limits. Normal pulmonary vascularity. No focal consolidation, pleural effusion, or pneumothorax. No acute osseous abnormality.  IMPRESSION: No active cardiopulmonary disease.  EKG: 04/29/2018 Rate 59 bpm Sinus bradycardia Otherwise normal ECG  CV:  Past Medical History:  Diagnosis  Date  . Arthritis   . Atherosclerosis of aorta ([place])   . CAD S/P percutaneous coronary angioplasty cardiologist-  dr Ellyn Hack   06-28-2002  Cardiac Cath subtotal occlusion of D1 -- staged PCI with Taxus DES 2.5 x 12; 08/2011: Cath for persistent CP despite normal Myoview -> 30% pLAD & pD1 with patent D1 stent.  EF ~45%.  . Diverticulosis   . GERD (gastroesophageal reflux disease)   . History of atrial fibrillation without current medication    one episode in 2011-- per cardiologist note has not had any other issues  . History of basal cell carcinoma (BCC) excision    x2  forehead  . History of kidney stones   . History of melanoma excision    scalp s/p moh's  . HTN (hypertension)   . Hypercholesterolemia   . Lung nodule    rt lower lobe  . Renal cyst   . S/P drug eluting coronary stent placement 06/28/2002   x1  to D1  . Varicose veins    wear teds  . Wears glasses   . Wears hearing aid in both ears     Past Surgical History:  Procedure Laterality Date  . BRACHIAL NERVE REPAIR Left 2007   approx.   RELEASE  . BUNIONECTOMY Right 2008   approx.   and Hammer toe correction  . CARPAL TUNNEL RELEASE Right 2018  . CERVICAL FUSION  ~2008;   07-21-2009  dr Carloyn Manner   posterior C2-3 2008 with screws;   cage fusion C2-3 07-21-2009  . CERVICAL GANGLIONECTOMY  1995   C2  posterior  . CORONARY ANGIOPLASTY  06-28-2002   dr Leonia Reeves  @[facility]    PCI and DES x1  to Diagonal 1  . INGUINAL HERNIA REPAIR Left 08/08/2016   Procedure: OPEN LEFT INGUINAL HERNIA REPAIR WITH MESH;  Surgeon: Armandina Gemma, MD;  Location: [facility];  Service: General;  Laterality: Left;  . INSERTION OF MESH Left 08/08/2016   Procedure: INSERTION OF MESH;  Surgeon: Armandina Gemma, MD;  Location: [facility];  Service: General;  Laterality: Left;  . LEFT HEART CATHETERIZATION WITH CORONARY ANGIOGRAM N/A 11/07/2011   Procedure: LEFT HEART CATHETERIZATION WITH CORONARY ANGIOGRAM;  Surgeon: Candee Furbish, MD;  Location: [facility] C[facility] LAB;  Service:  Cardiovascular: - Equivocal persistent symptoms despite nonischemic Myoview. EF ~45%. 30% calcified pLAD, 30% D1 prestent. Widely paent D1 DES stent.  . [place], 2010, 2011   Total 3 surgeries  . POSTERIOR LUMBAR FUSION  07-17 & 21-- 2019  dr Carloyn Manner  @ [facility] in [place]   staged  T10 -- S1  . REVERSE SHOULDER ARTHROPLASTY Right 11/14/2017  . REVERSE SHOULDER ARTHROPLASTY Right 11/13/2017   Procedure: REVERSE RIGHT SHOULDER ARTHROPLASTY;  Surgeon: Justice Britain, MD;  Location: [place];  Service: Orthopedics;  Laterality: Right;  . TRIGGER FINGER RELEASE Right 09-26-2005   dr sypher  @[facility]    right ring finger    MEDICATIONS: . HYDROcodone-acetaminophen (NORCO/VICODIN) 5-325 MG tablet  . aspirin EC 81 MG tablet  . Calcium Carb-Cholecalciferol (CALCIUM 600+D) 600-800 MG-UNIT TABS  . cetirizine (ZYRTEC) 10 MG tablet  . Coenzyme Q10 (COQ10) 400 MG CAPS  . esomeprazole (NEXIUM) 20 MG capsule  . folic acid (FOLVITE) 694 MCG tablet  . Glucosamine HCl-MSM (GLUCOSAMINE-MSM PO)  . ibuprofen (ADVIL,MOTRIN) 800 MG tablet  . lisinopril (PRINIVIL,ZESTRIL) 10 MG tablet  . methocarbamol (ROBAXIN) 500 MG tablet  . Misc Natural Products (TART CHERRY ADVANCED) CAPS  . Multiple Vitamins-Minerals ([place] 50+) CAPS  . nitroGLYCERIN (NITROSTAT) 0.4 MG SL tablet  . Riboflavin 100 MG TABS  . sildenafil (VIAGRA) 50 MG tablet  . vitamin B-12 (CYANOCOBALAMIN) 1000 MCG tablet  . vitamin C (ASCORBIC ACID) 500 MG tablet  . XIIDRA 5 % SOLN   No current facility-administered medications for this encounter.       Maia Plan [facility] Pre-Surgical Testing [phone] 05/13/18 1:18 PM

## 2018-05-14 ENCOUNTER — Encounter (HOSPITAL_COMMUNITY): Payer: Self-pay | Admitting: Emergency Medicine

## 2018-05-14 ENCOUNTER — Ambulatory Visit (HOSPITAL_COMMUNITY)
Admission: RE | Admit: 2018-05-14 | Discharge: 2018-05-14 | Disposition: A | Discharge status: Home / Self Care | Payer: Medicare Other | Attending: Orthopedic Surgery | Admitting: Orthopedic Surgery

## 2018-05-14 ENCOUNTER — Ambulatory Visit (HOSPITAL_COMMUNITY): Payer: Medicare Other | Admitting: Physician Assistant

## 2018-05-14 ENCOUNTER — Encounter (HOSPITAL_COMMUNITY)
Admission: RE | Disposition: A | Discharge status: Home / Self Care | Payer: Self-pay | Source: Home / Self Care | Attending: Orthopedic Surgery

## 2018-05-14 ENCOUNTER — Ambulatory Visit (HOSPITAL_COMMUNITY): Payer: Medicare Other | Admitting: Certified Registered Nurse Anesthetist

## 2018-05-14 DIAGNOSIS — M7542 Impingement syndrome of left shoulder: Secondary | ICD-10-CM | POA: Diagnosis not present

## 2018-05-14 DIAGNOSIS — Z8042 Family history of malignant neoplasm of prostate: Secondary | ICD-10-CM | POA: Insufficient documentation

## 2018-05-14 DIAGNOSIS — Z803 Family history of malignant neoplasm of breast: Secondary | ICD-10-CM | POA: Insufficient documentation

## 2018-05-14 DIAGNOSIS — M75112 Incomplete rotator cuff tear or rupture of left shoulder, not specified as traumatic: Secondary | ICD-10-CM | POA: Insufficient documentation

## 2018-05-14 DIAGNOSIS — S46012A Strain of muscle(s) and tendon(s) of the rotator cuff of left shoulder, initial encounter: Secondary | ICD-10-CM | POA: Diagnosis not present

## 2018-05-14 DIAGNOSIS — Z85820 Personal history of malignant melanoma of skin: Secondary | ICD-10-CM | POA: Insufficient documentation

## 2018-05-14 DIAGNOSIS — E7800 Pure hypercholesterolemia, unspecified: Secondary | ICD-10-CM | POA: Diagnosis not present

## 2018-05-14 DIAGNOSIS — I700 Atherosclerosis of aorta: Secondary | ICD-10-CM | POA: Diagnosis not present

## 2018-05-14 DIAGNOSIS — M7502 Adhesive capsulitis of left shoulder: Secondary | ICD-10-CM | POA: Insufficient documentation

## 2018-05-14 DIAGNOSIS — S43432A Superior glenoid labrum lesion of left shoulder, initial encounter: Secondary | ICD-10-CM | POA: Insufficient documentation

## 2018-05-14 DIAGNOSIS — M19012 Primary osteoarthritis, left shoulder: Secondary | ICD-10-CM | POA: Insufficient documentation

## 2018-05-14 DIAGNOSIS — Z791 Long term (current) use of non-steroidal anti-inflammatories (NSAID): Secondary | ICD-10-CM | POA: Insufficient documentation

## 2018-05-14 DIAGNOSIS — Z820 Family history of epilepsy and other diseases of the nervous system: Secondary | ICD-10-CM | POA: Insufficient documentation

## 2018-05-14 DIAGNOSIS — Z8249 Family history of ischemic heart disease and other diseases of the circulatory system: Secondary | ICD-10-CM | POA: Insufficient documentation

## 2018-05-14 DIAGNOSIS — K219 Gastro-esophageal reflux disease without esophagitis: Secondary | ICD-10-CM | POA: Insufficient documentation

## 2018-05-14 DIAGNOSIS — Z7982 Long term (current) use of aspirin: Secondary | ICD-10-CM | POA: Insufficient documentation

## 2018-05-14 DIAGNOSIS — M75102 Unspecified rotator cuff tear or rupture of left shoulder, not specified as traumatic: Secondary | ICD-10-CM | POA: Diagnosis not present

## 2018-05-14 DIAGNOSIS — I4891 Unspecified atrial fibrillation: Secondary | ICD-10-CM | POA: Insufficient documentation

## 2018-05-14 DIAGNOSIS — I10 Essential (primary) hypertension: Secondary | ICD-10-CM | POA: Insufficient documentation

## 2018-05-14 DIAGNOSIS — Z87442 Personal history of urinary calculi: Secondary | ICD-10-CM | POA: Diagnosis not present

## 2018-05-14 DIAGNOSIS — I2510 Atherosclerotic heart disease of native coronary artery without angina pectoris: Secondary | ICD-10-CM | POA: Diagnosis not present

## 2018-05-14 DIAGNOSIS — Z955 Presence of coronary angioplasty implant and graft: Secondary | ICD-10-CM | POA: Insufficient documentation

## 2018-05-14 DIAGNOSIS — Z96611 Presence of right artificial shoulder joint: Secondary | ICD-10-CM | POA: Insufficient documentation

## 2018-05-14 DIAGNOSIS — H9193 Unspecified hearing loss, bilateral: Secondary | ICD-10-CM | POA: Insufficient documentation

## 2018-05-14 DIAGNOSIS — X58XXXA Exposure to other specified factors, initial encounter: Secondary | ICD-10-CM | POA: Insufficient documentation

## 2018-05-14 DIAGNOSIS — Z79899 Other long term (current) drug therapy: Secondary | ICD-10-CM | POA: Insufficient documentation

## 2018-05-14 DIAGNOSIS — Z8049 Family history of malignant neoplasm of other genital organs: Secondary | ICD-10-CM | POA: Insufficient documentation

## 2018-05-14 DIAGNOSIS — R911 Solitary pulmonary nodule: Secondary | ICD-10-CM | POA: Insufficient documentation

## 2018-05-14 DIAGNOSIS — Z800 Family history of malignant neoplasm of digestive organs: Secondary | ICD-10-CM | POA: Insufficient documentation

## 2018-05-14 DIAGNOSIS — G8918 Other acute postprocedural pain: Secondary | ICD-10-CM | POA: Diagnosis not present

## 2018-05-14 DIAGNOSIS — Z981 Arthrodesis status: Secondary | ICD-10-CM | POA: Insufficient documentation

## 2018-05-14 SURGERY — SHOULDER ARTHROSCOPY WITH SUBACROMIAL DECOMPRESSION AND DISTAL CLAVICLE EXCISION
Anesthesia: General | Site: Shoulder | Laterality: Left

## 2018-05-14 MED ORDER — SUGAMMADEX SODIUM 200 MG/2ML IV SOLN
INTRAVENOUS | Status: AC
  Filled 2018-05-14: qty 2

## 2018-05-14 MED ORDER — HYDROMORPHONE HCL 1 MG/ML IJ SOLN: 0.2500 mg | INTRAMUSCULAR | Status: DC | PRN

## 2018-05-14 MED ORDER — CHLORHEXIDINE GLUCONATE 4 % EX LIQD: 60.0000 mL | Freq: Once | CUTANEOUS | Status: DC

## 2018-05-14 MED ORDER — FENTANYL CITRATE (PF) 100 MCG/2ML IJ SOLN
INTRAMUSCULAR | Status: AC
  Administered 2018-05-14: 100 ug via INTRAVENOUS
  Filled 2018-05-14: qty 2

## 2018-05-14 MED ORDER — CEFAZOLIN SODIUM-DEXTROSE 2-4 GM/100ML-% IV SOLN
2.0000 g | INTRAVENOUS | Status: AC
  Administered 2018-05-14: 2 g via INTRAVENOUS
  Filled 2018-05-14: qty 100

## 2018-05-14 MED ORDER — METHOCARBAMOL 500 MG PO TABS: 500.0000 mg | ORAL_TABLET | Freq: Four times a day (QID) | ORAL | 2 refills | Status: AC | PRN

## 2018-05-14 MED ORDER — ROCURONIUM BROMIDE 100 MG/10ML IV SOLN
INTRAVENOUS | Status: AC
  Filled 2018-05-14: qty 1

## 2018-05-14 MED ORDER — FENTANYL CITRATE (PF) 100 MCG/2ML IJ SOLN
50.0000 ug | INTRAMUSCULAR | Status: DC
  Administered 2018-05-14: 100 ug via INTRAVENOUS

## 2018-05-14 MED ORDER — BUPIVACAINE LIPOSOME 1.3 % IJ SUSP
INTRAMUSCULAR | Status: DC | PRN
  Administered 2018-05-14: 133 mg

## 2018-05-14 MED ORDER — EPHEDRINE SULFATE-NACL 50-0.9 MG/10ML-% IV SOSY
PREFILLED_SYRINGE | INTRAVENOUS | Status: DC | PRN
  Administered 2018-05-14 (×3): 5 mg via INTRAVENOUS
  Administered 2018-05-14: 10 mg via INTRAVENOUS

## 2018-05-14 MED ORDER — ROCURONIUM BROMIDE 10 MG/ML (PF) SYRINGE
PREFILLED_SYRINGE | INTRAVENOUS | Status: DC | PRN
  Administered 2018-05-14: 50 mg via INTRAVENOUS

## 2018-05-14 MED ORDER — ONDANSETRON HCL 4 MG/2ML IJ SOLN
INTRAMUSCULAR | Status: DC | PRN
  Administered 2018-05-14: 4 mg via INTRAVENOUS

## 2018-05-14 MED ORDER — BUPIVACAINE HCL (PF) 0.5 % IJ SOLN
INTRAMUSCULAR | Status: DC | PRN
  Administered 2018-05-14: 10 mL

## 2018-05-14 MED ORDER — EPHEDRINE 5 MG/ML INJ
INTRAVENOUS | Status: AC
  Filled 2018-05-14: qty 10

## 2018-05-14 MED ORDER — PROPOFOL 10 MG/ML IV BOLUS
INTRAVENOUS | Status: AC
  Filled 2018-05-14: qty 20

## 2018-05-14 MED ORDER — LACTATED RINGERS IR SOLN
Status: DC | PRN
  Administered 2018-05-14: 6000 mL

## 2018-05-14 MED ORDER — ONDANSETRON HCL 4 MG PO TABS: 4.0000 mg | ORAL_TABLET | Freq: Three times a day (TID) | ORAL | 0 refills | Status: DC | PRN

## 2018-05-14 MED ORDER — LIDOCAINE 2% (20 MG/ML) 5 ML SYRINGE
INTRAMUSCULAR | Status: AC
  Filled 2018-05-14: qty 5

## 2018-05-14 MED ORDER — LIDOCAINE 2% (20 MG/ML) 5 ML SYRINGE
INTRAMUSCULAR | Status: DC | PRN
  Administered 2018-05-14: 60 mg via INTRAVENOUS

## 2018-05-14 MED ORDER — LACTATED RINGERS IV SOLN
INTRAVENOUS | Status: DC
  Administered 2018-05-14: 11:00:00 via INTRAVENOUS

## 2018-05-14 MED ORDER — PROPOFOL 10 MG/ML IV BOLUS
INTRAVENOUS | Status: DC | PRN
  Administered 2018-05-14: 200 mg via INTRAVENOUS

## 2018-05-14 MED ORDER — DEXAMETHASONE SODIUM PHOSPHATE 4 MG/ML IJ SOLN
INTRAMUSCULAR | Status: DC | PRN
  Administered 2018-05-14: 10 mg via INTRAVENOUS

## 2018-05-14 MED ORDER — PROMETHAZINE HCL 25 MG/ML IJ SOLN: 6.2500 mg | INTRAMUSCULAR | Status: DC | PRN

## 2018-05-14 MED ORDER — HYDROCODONE-ACETAMINOPHEN 10-325 MG PO TABS: 1.0000 | ORAL_TABLET | ORAL | 0 refills | Status: DC | PRN

## 2018-05-14 MED ORDER — SUGAMMADEX SODIUM 200 MG/2ML IV SOLN
INTRAVENOUS | Status: DC | PRN
  Administered 2018-05-14: 200 mg via INTRAVENOUS

## 2018-05-14 SURGICAL SUPPLY — 73 items
BLADE CUTTER GATOR 3.5 (BLADE) ×3 IMPLANT
BLADE EXCALIBUR 4.0MM X 13CM (MISCELLANEOUS) ×1
BLADE EXCALIBUR 4.0X13 (MISCELLANEOUS) ×1 IMPLANT
BLADE GREAT WHITE 4.2 (BLADE) ×2 IMPLANT
BLADE GREAT WHITE 4.2MM (BLADE) ×1
BLADE SURG 11 STRL SS (BLADE) ×3 IMPLANT
BOOTCOVER CLEANROOM LRG (PROTECTIVE WEAR) ×6 IMPLANT
BUR OVAL 4.0 (BURR) ×3 IMPLANT
BURR OVAL 8 FLU 4.0MM X 13CM (MISCELLANEOUS) ×1
BURR OVAL 8 FLU 4.0X13 (MISCELLANEOUS) ×1 IMPLANT
CANISTER SUCT LVC 12 LTR MEDI- (MISCELLANEOUS) ×3 IMPLANT
CANNULA ACUFLEX KIT 5X76 (CANNULA) ×3 IMPLANT
CANNULA DRILOCK 5.0MMX75MM (CANNULA) ×1
CANNULA DRILOCK 5.0X75 (CANNULA) ×2 IMPLANT
CANNULA TWIST IN 8.25X7CM (CANNULA) ×3 IMPLANT
CLOSURE WOUND 1/2 X4 (GAUZE/BANDAGES/DRESSINGS) ×1
CONNECTOR 5 IN 1 STRAIGHT STRL (MISCELLANEOUS) ×3 IMPLANT
COVER WAND RF STERILE (DRAPES) ×3 IMPLANT
DISSECTOR  3.8MM X 13CM (MISCELLANEOUS) ×2
DISSECTOR 3.8MM X 13CM (MISCELLANEOUS) IMPLANT
DRAPE INCISE 23X17 IOBAN STRL (DRAPES) ×2
DRAPE INCISE 23X17 STRL (DRAPES) ×1 IMPLANT
DRAPE INCISE IOBAN 23X17 STRL (DRAPES) ×1 IMPLANT
DRAPE INCISE IOBAN 66X45 STRL (DRAPES) ×3 IMPLANT
DRAPE ORTHO SPLIT 77X108 STRL (DRAPES) ×6
DRAPE STERI 35X30 U-POUCH (DRAPES) ×2 IMPLANT
DRAPE SURG 17X11 SM STRL (DRAPES) ×3 IMPLANT
DRAPE SURG ORHT 6 SPLT 77X108 (DRAPES) ×2 IMPLANT
DRAPE U-SHAPE 47X51 STRL (DRAPES) ×3 IMPLANT
DRSG PAD ABDOMINAL 8X10 ST (GAUZE/BANDAGES/DRESSINGS) ×6 IMPLANT
DURAPREP 26ML APPLICATOR (WOUND CARE) ×6 IMPLANT
FIBER TAPE 2MM (SUTURE) IMPLANT
GAUZE SPONGE 4X4 12PLY STRL (GAUZE/BANDAGES/DRESSINGS) ×2 IMPLANT
GLOVE BIO SURGEON STRL SZ7.5 (GLOVE) ×3 IMPLANT
GLOVE BIO SURGEON STRL SZ8 (GLOVE) ×3 IMPLANT
GLOVE BIOGEL PI IND STRL 7.5 (GLOVE) ×2 IMPLANT
GLOVE BIOGEL PI IND STRL 9 (GLOVE) ×1 IMPLANT
GLOVE BIOGEL PI INDICATOR 7.5 (GLOVE) ×4
GLOVE BIOGEL PI INDICATOR 9 (GLOVE) ×2
GLOVE SS BIOGEL STRL SZ 7 (GLOVE) ×1 IMPLANT
GLOVE SS BIOGEL STRL SZ 7.5 (GLOVE) ×1 IMPLANT
GLOVE SUPERSENSE BIOGEL SZ 7 (GLOVE) ×2
GLOVE SUPERSENSE BIOGEL SZ 7.5 (GLOVE) ×2
GLOVE SURG ORTHO 9.0 STRL STRW (GLOVE) ×3 IMPLANT
GOWN SPEC L3 XXLG W/TWL (GOWN DISPOSABLE) ×2 IMPLANT
GOWN STRL REUS W/TWL LRG LVL3 (GOWN DISPOSABLE) ×4 IMPLANT
KIT BASIN OR (CUSTOM PROCEDURE TRAY) ×3 IMPLANT
KIT SHOULDER TRACTION (DRAPES) ×3 IMPLANT
KIT TURNOVER KIT B (KITS) ×3 IMPLANT
MANIFOLD NEPTUNE II (INSTRUMENTS) ×3 IMPLANT
NDL SCORPION MULTI FIRE (NEEDLE) IMPLANT
NEEDLE SCORPION MULTI FIRE (NEEDLE) IMPLANT
NEEDLE SPNL 18GX3.5 QUINCKE PK (NEEDLE) ×3 IMPLANT
PACK SHOULDER (CUSTOM PROCEDURE TRAY) ×3 IMPLANT
PAD ABD 8X10 STRL (GAUZE/BANDAGES/DRESSINGS) ×4 IMPLANT
PAD ARMBOARD 7.5X6 YLW CONV (MISCELLANEOUS) ×6 IMPLANT
PROBE APOLLO 90XL (SURGICAL WAND) ×2 IMPLANT
SET ARTHROSCOPY TUBING (MISCELLANEOUS) ×3
SET ARTHROSCOPY TUBING LN (MISCELLANEOUS) ×1 IMPLANT
SLING ARM FOAM STRAP LRG (SOFTGOODS) ×3 IMPLANT
SLING ARM FOAM STRAP MED (SOFTGOODS) IMPLANT
SPONGE LAP 4X18 RFD (DISPOSABLE) IMPLANT
STRIP CLOSURE SKIN 1/2X4 (GAUZE/BANDAGES/DRESSINGS) ×2 IMPLANT
SUT MNCRL AB 3-0 PS2 18 (SUTURE) ×3 IMPLANT
SUT PDS AB 0 CT 36 (SUTURE) IMPLANT
SUT RETRIEVER GRASP 30 DEG (SUTURE) IMPLANT
SUT TIGER TAPE 7 IN WHITE (SUTURE) IMPLANT
SYR 20CC LL (SYRINGE) ×3 IMPLANT
TAPE PAPER 3X10 WHT MICROPORE (GAUZE/BANDAGES/DRESSINGS) ×3 IMPLANT
TOWEL OR 17X24 6PK STRL BLUE (TOWEL DISPOSABLE) ×3 IMPLANT
TOWEL OR 17X26 10 PK STRL BLUE (TOWEL DISPOSABLE) ×3 IMPLANT
TUBING ARTHROSCOPY IRRIG 16FT (MISCELLANEOUS) ×2 IMPLANT
WAND SUCTION MAX 4MM 90S (SURGICAL WAND) ×3 IMPLANT

## 2018-05-14 NOTE — H&P (Signed)
Ian [name]    Chief Complaint: left shoulder chronic impingement; acromioclavicular osteoarthritis HPI: The patient is a 73 y.o. male with chronic left shoulder impingement syndrome which has been refractory to prolonged attempts at conservative management.  Past Medical History:  Diagnosis Date  . Arthritis   . Atherosclerosis of aorta ([place])   . CAD S/P percutaneous coronary angioplasty cardiologist-  dr Ellyn Hack   06-28-2002  Cardiac Cath subtotal occlusion of D1 -- staged PCI with Taxus DES 2.5 x 12; 08/2011: Cath for persistent CP despite normal Myoview -> 30% pLAD & pD1 with patent D1 stent.  EF ~45%.  . Diverticulosis   . GERD (gastroesophageal reflux disease)   . History of atrial fibrillation without current medication    one episode in 2011-- per cardiologist note has not had any other issues  . History of basal cell carcinoma (BCC) excision    x2  forehead  . History of kidney stones   . History of melanoma excision    scalp s/p moh's  . HTN (hypertension)   . Hypercholesterolemia   . Lung nodule    rt lower lobe  . Renal cyst   . S/P drug eluting coronary stent placement 06/28/2002   x1  to D1  . Varicose veins    wear teds  . Wears glasses   . Wears hearing aid in both ears     Past Surgical History:  Procedure Laterality Date  . BRACHIAL NERVE REPAIR Left 2007   approx.   RELEASE  . BUNIONECTOMY Right 2008   approx.   and Hammer toe correction  . CARPAL TUNNEL RELEASE Right 2018  . CERVICAL FUSION  ~2008;   07-21-2009   dr Carloyn Manner   posterior C2-3 2008 with screws;   cage fusion C2-3 07-21-2009  . CERVICAL GANGLIONECTOMY  1995   C2  posterior  . CORONARY ANGIOPLASTY  06-28-2002   dr Leonia Reeves  @[facility]    PCI and DES x1  to Diagonal 1  . INGUINAL HERNIA REPAIR Left 08/08/2016   Procedure: OPEN LEFT INGUINAL HERNIA REPAIR WITH MESH;  Surgeon: Armandina Gemma, MD;  Location: [facility];  Service: General;  Laterality: Left;  . INSERTION OF MESH Left 08/08/2016   Procedure:  INSERTION OF MESH;  Surgeon: Armandina Gemma, MD;  Location: [facility];  Service: General;  Laterality: Left;  . LEFT HEART CATHETERIZATION WITH CORONARY ANGIOGRAM N/A 11/07/2011   Procedure: LEFT HEART CATHETERIZATION WITH CORONARY ANGIOGRAM;  Surgeon: Candee Furbish, MD;  Location: [facility] CATH LAB;  Service: Cardiovascular: - Equivocal persistent symptoms despite nonischemic Myoview. EF ~45%. 30% calcified pLAD, 30% D1 prestent. Widely paent D1 DES stent.  . [place], 2010, 2011   Total 3 surgeries  . POSTERIOR LUMBAR FUSION  07-17 & 21-- 2019  dr Carloyn Manner  @ [facility] in [place]   staged  T10 -- S1  . REVERSE SHOULDER ARTHROPLASTY Right 11/14/2017  . REVERSE SHOULDER ARTHROPLASTY Right 11/13/2017   Procedure: REVERSE RIGHT SHOULDER ARTHROPLASTY;  Surgeon: Justice Britain, MD;  Location: [facility];  Service: Orthopedics;  Laterality: Right;  . TRIGGER FINGER RELEASE Right 09-26-2005   dr sypher  @[facility]    right ring finger    Family History  Problem Relation Age of Onset  . Hypertension Mother   . Dementia Mother   . Coronary artery disease Father   . Esophageal cancer Father   . Cancer Sister        breast, one sister  . Cancer - Prostate  Brother   . Cervical cancer Maternal Grandmother        colon  . Heart attack Maternal Grandfather 63       He was a smoker    Social History:  reports that he has never smoked. He has never used smokeless tobacco. He reports that he does not drink alcohol or use drugs.   Medications Prior to Admission  Medication Sig Dispense Refill  . aspirin EC 81 MG tablet Take 81 mg by mouth at bedtime.     . Calcium Carb-Cholecalciferol (CALCIUM 600+D) 600-800 MG-UNIT TABS Take 1 tablet by mouth daily.     . cetirizine (ZYRTEC) 10 MG tablet Take 10 mg by mouth at bedtime.     . Coenzyme Q10 (COQ10) 400 MG CAPS Take 400 mg by mouth daily.     Marland Kitchen esomeprazole (NEXIUM) 20 MG capsule Take 20 mg by mouth every Monday, Wednesday, and Friday.     . folic acid (FOLVITE) 235  MCG tablet Take 400 mcg by mouth daily.    . Glucosamine HCl-MSM (GLUCOSAMINE-MSM PO) Take 2 tablets by mouth daily.     Marland Kitchen HYDROcodone-acetaminophen (NORCO/VICODIN) 5-325 MG tablet Take 1 tablet by mouth every 6 (six) hours as needed for moderate pain.    Marland Kitchen ibuprofen (ADVIL,MOTRIN) 800 MG tablet Take 1 tablet (800 mg total) by mouth every 8 (eight) hours as needed. (Patient taking differently: Take 800 mg by mouth at bedtime. ) 90 tablet 0  . lisinopril (PRINIVIL,ZESTRIL) 10 MG tablet Take 10 mg by mouth at bedtime.     . methocarbamol (ROBAXIN) 500 MG tablet Take 1 tablet (500 mg total) by mouth every 6 (six) hours as needed for muscle spasms. (Patient taking differently: Take 500 mg by mouth at bedtime. ) 30 tablet 2  . Misc Natural Products (TART CHERRY ADVANCED) CAPS Take 2 capsules by mouth daily.     . Multiple Vitamins-Minerals (OCUVITE ADULT 50+) CAPS Take 1 capsule by mouth daily.    . nitroGLYCERIN (NITROSTAT) 0.4 MG SL tablet Place 0.4 mg under the tongue every 5 (five) minutes as needed for chest pain.     . Riboflavin 100 MG TABS Take 400 mg by mouth daily.    . vitamin B-12 (CYANOCOBALAMIN) 1000 MCG tablet Take 1,000 mcg by mouth daily.    . vitamin C (ASCORBIC ACID) 500 MG tablet Take 1,000 mg by mouth daily.     . sildenafil (VIAGRA) 50 MG tablet Take 50 mg by mouth daily as needed for erectile dysfunction.    Marland Kitchen XIIDRA 5 % SOLN Place 1 drop into both eyes 2 (two) times daily as needed for dry eyes.       Physical Exam: Left shoulder exam demonstrates a painful and guarded range of motion, with a positive impingement sign.  Preoperative MRI scan demonstrates significant bony impingement as well as AC joint arthropathy.  Vitals  Temp:  [98.4 F (36.9 C)] 98.4 F (36.9 C) (01/30 1053) Pulse Rate:  [51-59] 59 (01/30 1136) Resp:  [11-21] 21 (01/30 1136) BP: (154-163)/(80-92) 163/92 (01/30 1131) SpO2:  [99 %-100 %] 100 % (01/30 1136) Weight:  [84.9 kg] 84.9 kg (01/30  1105)  Assessment/Plan  Impression: left shoulder chronic impingement; acromioclavicular osteoarthritis  Plan of Action: Procedure(s): SHOULDER ARTHROSCOPY WITH SUBACROMIAL DECOMPRESSION AND DISTAL CLAVICLE EXCISION  Serigne Kubicek M Jalilah Wiltsie 05/14/2018, 11:39 AM Contact # [phone]

## 2018-05-14 NOTE — Anesthesia Postprocedure Evaluation (Signed)
Anesthesia Post Note  Patient: Ian [name]  Procedure(s) Performed: SHOULDER ARTHROSCOPY WITH SUBACROMIAL DECOMPRESSION AND DISTAL CLAVICLE EXCISION; LABRAL DEBRIDEMENT AND ROTATOR CUFF DEBRIDEMENT (Left Shoulder)     Patient location during evaluation: PACU Anesthesia Type: General Level of consciousness: sedated and patient cooperative Pain management: pain level controlled Vital Signs Assessment: post-procedure vital signs reviewed and stable Respiratory status: spontaneous breathing Cardiovascular status: stable Anesthetic complications: no    Last Vitals:  Vitals:   05/14/18 1430 05/14/18 1444  BP: (!) 163/76 (!) 170/94  Pulse: 64 78  Resp: 12 13  Temp: (!) 36.3 C (!) 36.3 C  SpO2: 100% 100%    Last Pain:  Vitals:   05/14/18 1430  TempSrc:   PainSc: 0-No pain                 Nolon Nations

## 2018-05-14 NOTE — Discharge Instructions (Signed)
   Kevin M. Supple, M.D., F.A.A.O.S. Orthopaedic Surgery Specializing in Arthroscopic and Reconstructive Surgery of the Shoulder [phone] [address] - [place], [place] [address] - Fax [phone]   POST-OP SHOULDER ARTHROSCOPY INSTRUCTIONS  1. Call the office at [phone] to schedule your first post-op appointment 7-10 days from the date of your surgery.  2. Leave the steri-strips in place over your incisions when performing dressing changes and showering. You may remove your dressings and begin showering 72 hours from surgery. You can expect drainage that is clear to bloody in nature that occasionally will soak through your dressings. If this occurs go ahead and perform a dressing change. The drainage should lessen daily and when there is no drainage from your incisions feel free to go without a dressing.  3. Wear your sling for comfort. You may come out of your sling for ad lib activity and even decide not to use the sling at all. If you find you are more comfortable in your sling, make sure you come out of your sling at least 3-4 times a day to do the exercises that are included below.  4. Range of motion to your elbow, wrist, and hand are encouraged 3-5 times daily. Exercise to your hand and fingers helps to reduce swelling you may experience.  5. Utilize ice to the shoulder 3-4 times minimum a day and additionally if you are experiencing pain.  6. You may drive when safely off narcotics and muscle relaxants.  7.Pain control following an exparel block  To help control your post-operative pain you received a nerve block  performed with Exparel which is a long acting anesthetic (numbing agent) which can provide pain relief and sensations of numbness (and relief of pain) in the operative shoulder and arm for up to 3 days. Sometimes it provides mixed relief, meaning you may still have numbness in certain areas of the arm but can still be able to move  parts of that arm,  hand, and fingers. We recommend that your prescribed pain medications  be used as needed. We do not feel it is necessary to "pre medicate" and "stay ahead" of pain.  Taking narcotic pain medications when you are not having any pain can lead to unnecessary and potentially dangerous side effects.    8. Pain medications can produce constipation along with their use. If you experience this, the use of an over the counter stool softener or laxative daily is recommended.   9. For additional questions or concerns, please do not hesitate to call the office. If after hours there is an answering service to forward your concerns to the physician on call.   POST-OP EXERCISES  The pendulum exercises should be performed while bending at the waist as far over as possible thereby letting gravity do the work for you.  Range of Motion Exercises: Pendulum (circular)  Repeat 20 times. Do 3 sessions per day.     Range of Motion Exercises: Pendulum (side-to-side)  Repeat 20 times. Do 3 sessions per day.    Range of Motion Exercises (self-stretching activities):  Slide arm up wall with palm toward you, moving closer to the wall. Hold for 5 seconds.  Repeat 10 times. Do 3 sessions per day.       

## 2018-05-14 NOTE — Op Note (Signed)
05/14/2018  1:42 PM  PATIENT:   Ian [name]  73 y.o. male  PRE-OPERATIVE DIAGNOSIS:  left shoulder chronic impingement; acromioclavicular osteoarthritis  POST-OPERATIVE DIAGNOSIS: Same, with additional findings of degenerative labral tear and partial articular rotator cuff tear  PROCEDURE:  1.  Left shoulder examination under anesthesia  2.  Left shoulder glenohumeral joint diagnostic arthroscopy  3.  Labral debridement  4.  Debridement of partial articular rotator cuff tear  5.  Arthroscopic subacromial decompression and bursectomy  6.  Arthroscopic distal clavicle resection  SURGEON:  Calypso Hagarty, Metta Clines. M.D.  ASSISTANTS: Jenetta Loges, PA-C  ANESTHESIA:   General endotracheal and interscalene block with Exparel  EBL: Minimal  SPECIMEN: None  Drains: None   PATIENT DISPOSITION:  PACU - hemodynamically stable.    PLAN OF CARE: Discharge to home after PACU  Brief history:  Patient is a 73 year old male who has had chronic and progressively increasing left shoulder pain related to an impingement syndrome.  Preoperative MRI scan shows the rotator cuff to be intact with no obvious discrete full-thickness tears.  Significant bony impingement is noted.  Due to his ongoing pain and functional limitations he is brought to the operating room this time for planned left shoulder arthroscopy as described below  Preoperatively and counseled patient regarding treatment options as well as the potential risks versus benefits thereof.  Possible complications were reviewed including bleeding, infection, neurovascular injury, persistent pain, loss of motion, anesthetic complication, and possible need for additional surgery.  He understands and accepts and agrees with her planned procedure.  Procedure in detail:  After undergoing routine preop evaluation patient received prophylactic antibiotics and interscalene block with Exparel established in the holding area by the anesthesia  department.  Placed supine on the operating table underwent smooth duction of a general endotracheal anesthesia.  Placed into the right lateral decubitus position and appropriately padded and protected.  Left shoulder examination under anesthesia revealed full motion and no instability patterns.  Left arm was then suspended at 70 degrees of abduction with 10 pounds of traction in the left shoulder girdle region was sterilely prepped and draped in standard fashion.  Timeout was called.  A posterior portal established into the glenohumeral joint anterior portal established under direct visualization.  The articular surfaces were found to be in good condition.  There was extensive degenerative tearing of the anterior and superior labrum which was debrided with a shaver back to stable margin.  The biceps tendon was of normal caliber with no evidence for proximal or distal instability.  Rotator cuff showed partial articular tearing of the distal supraspinatus I would estimate no more than perhaps 10 to 15% of the thickness of the tendon.  These areas were debrided back to healthy tissue.  The capsular volume was otherwise within normal limits.  No additional pathologies were identified.  Fluid and instruments were then removed.  The arm was dropped down to 30 degrees of abduction with the arthroscope introduced into the subacromial space of the posterior portal and a direct lateral portal established in the subacromial space.  Abundant dense bursal tissue and multiple adhesions were encountered and these were all divided and excised, the shaver and the Arthrex wand.  A bur was then introduced and used to perform a subacromial decompression creating a type I morphology.  A portal was then established directly anterior to the distal clavicle and the distal clavicle resection was performed with a bur.  Care was taken to confirm visualization of the entire circumference  of the distal clavicle and the proximal 8 mm of bone  was excised.  We then completed the subacromial bursectomy.  Bursal surface of the rotator cuff is noted to be intact.  Final hemostasis was obtained.  Fluid and this was removed.  The portals were closed with Monocryl and a Steri-Strip.  A dry dressing taped at the left shoulder left arm was placed in sling immobilizer.  The patient was awakened, extubated, taken recovery in stable addition.  Jenetta Loges, PA-C was used as an Environmental consultant throughout this case essential for help with positioning the patient, position extremity, tissue manipulation, wound closure, and intraoperative decision making.  Metta Clines Calynn Ferrero MD   Contact # [phone]

## 2018-05-14 NOTE — Transfer of Care (Signed)
Immediate Anesthesia Transfer of Care Note  Patient: Ian [name]  Procedure(s) Performed: SHOULDER ARTHROSCOPY WITH SUBACROMIAL DECOMPRESSION AND DISTAL CLAVICLE EXCISION; LABRAL DEBRIDEMENT AND ROTATOR CUFF DEBRIDEMENT (Left Shoulder)  Patient Location: PACU  Anesthesia Type:GA combined with regional for post-op pain  Level of Consciousness: drowsy  Airway & Oxygen Therapy: Patient Spontanous Breathing and Patient connected to face mask  Post-op Assessment: Report given to RN and Post -op Vital signs reviewed and stable  Post vital signs: Reviewed and stable  Last Vitals:  Vitals Value Taken Time  BP 155/80 05/14/2018  1:56 PM  Temp    Pulse 71 05/14/2018  1:58 PM  Resp 13 05/14/2018  1:58 PM  SpO2 100 % 05/14/2018  1:58 PM  Vitals shown include unvalidated device data.  Last Pain:  Vitals:   05/14/18 1105  TempSrc:   PainSc: 0-No pain      Patients Stated Pain Goal: 4 (54/98/26 4158)  Complications: No apparent anesthesia complications

## 2018-05-14 NOTE — Anesthesia Procedure Notes (Signed)
Procedure Name: Intubation Date/Time: 05/14/2018 12:41 PM Performed by: Claudia Desanctis, CRNA Pre-anesthesia Checklist: Patient identified, Emergency Drugs available, Suction available and Patient being monitored Patient Re-evaluated:Patient Re-evaluated prior to induction Oxygen Delivery Method: Circle system utilized Preoxygenation: Pre-oxygenation with 100% oxygen Induction Type: IV induction Ventilation: Mask ventilation without difficulty Laryngoscope Size: 2 and Miller Grade View: Grade II Tube type: Oral Number of attempts: 1 Airway Equipment and Method: Stylet Placement Confirmation: ETT inserted through vocal cords under direct vision,  positive ETCO2 and breath sounds checked- equal and bilateral Secured at: 22 cm Tube secured with: Tape Dental Injury: Teeth and Oropharynx as per pre-operative assessment

## 2018-05-14 NOTE — Anesthesia Procedure Notes (Signed)
Anesthesia Regional Block: Interscalene brachial plexus block   Pre-Anesthetic Checklist: ,, timeout performed, Correct Patient, Correct Site, Correct Laterality, Correct Procedure, Correct Position, site marked, Risks and benefits discussed,  Surgical consent,  Pre-op evaluation,  At surgeon's request and post-op pain management  Laterality: Left  Prep: chloraprep       Needles:  Injection technique: Single-shot  Needle Type: Stimulator Needle - 40     Needle Length: 4cm  Needle Gauge: 22     Additional Needles:   Procedures:,,,, ultrasound used (permanent image in chart),,,,  Narrative:  Start time: 05/14/2018 11:25 AM End time: 05/14/2018 11:28 AM Injection made incrementally with aspirations every 5 mL. Anesthesiologist: Nolon Nations, MD  Additional Notes: BP cuff, EKG monitors applied. Sedation begun. Nerve location verified with U/S. Anesthetic injected incrementally, slowly , and after neg aspirations under direct u/s guidance. Good perineural spread. Tolerated well.

## 2018-05-14 NOTE — Progress Notes (Signed)
Assisted Dr. Germeroth with left, ultrasound guided, interscalene  block. Side rails up, monitors on throughout procedure. See vital signs in flow sheet. Tolerated Procedure well. 

## 2018-05-25 DIAGNOSIS — M25612 Stiffness of left shoulder, not elsewhere classified: Secondary | ICD-10-CM | POA: Diagnosis not present

## 2018-05-25 DIAGNOSIS — M25512 Pain in left shoulder: Secondary | ICD-10-CM | POA: Diagnosis not present

## 2018-05-28 DIAGNOSIS — M25512 Pain in left shoulder: Secondary | ICD-10-CM | POA: Diagnosis not present

## 2018-05-28 DIAGNOSIS — M25612 Stiffness of left shoulder, not elsewhere classified: Secondary | ICD-10-CM | POA: Diagnosis not present

## 2018-06-02 DIAGNOSIS — M25512 Pain in left shoulder: Secondary | ICD-10-CM | POA: Diagnosis not present

## 2018-06-05 DIAGNOSIS — M25512 Pain in left shoulder: Secondary | ICD-10-CM | POA: Diagnosis not present

## 2018-06-09 DIAGNOSIS — M25512 Pain in left shoulder: Secondary | ICD-10-CM | POA: Diagnosis not present

## 2018-06-11 DIAGNOSIS — M25512 Pain in left shoulder: Secondary | ICD-10-CM | POA: Diagnosis not present

## 2018-06-16 DIAGNOSIS — M25512 Pain in left shoulder: Secondary | ICD-10-CM | POA: Diagnosis not present

## 2018-06-19 DIAGNOSIS — M25512 Pain in left shoulder: Secondary | ICD-10-CM | POA: Diagnosis not present

## 2018-06-24 DIAGNOSIS — M25512 Pain in left shoulder: Secondary | ICD-10-CM | POA: Diagnosis not present

## 2018-07-20 DIAGNOSIS — R361 Hematospermia: Secondary | ICD-10-CM | POA: Diagnosis not present

## 2018-07-22 DIAGNOSIS — M7542 Impingement syndrome of left shoulder: Secondary | ICD-10-CM | POA: Diagnosis not present

## 2018-07-22 DIAGNOSIS — Z4789 Encounter for other orthopedic aftercare: Secondary | ICD-10-CM | POA: Diagnosis not present

## 2018-07-29 DIAGNOSIS — M79641 Pain in right hand: Secondary | ICD-10-CM | POA: Diagnosis not present

## 2018-07-29 DIAGNOSIS — R2231 Localized swelling, mass and lump, right upper limb: Secondary | ICD-10-CM | POA: Diagnosis not present

## 2018-07-29 DIAGNOSIS — M65351 Trigger finger, right little finger: Secondary | ICD-10-CM | POA: Diagnosis not present

## 2018-08-07 DIAGNOSIS — N402 Nodular prostate without lower urinary tract symptoms: Secondary | ICD-10-CM | POA: Diagnosis not present

## 2018-08-07 DIAGNOSIS — R361 Hematospermia: Secondary | ICD-10-CM | POA: Diagnosis not present

## 2018-08-13 DIAGNOSIS — N402 Nodular prostate without lower urinary tract symptoms: Secondary | ICD-10-CM | POA: Diagnosis not present

## 2018-08-13 DIAGNOSIS — D075 Carcinoma in situ of prostate: Secondary | ICD-10-CM | POA: Diagnosis not present

## 2018-08-13 DIAGNOSIS — C61 Malignant neoplasm of prostate: Secondary | ICD-10-CM | POA: Diagnosis not present

## 2018-08-13 DIAGNOSIS — N411 Chronic prostatitis: Secondary | ICD-10-CM | POA: Diagnosis not present

## 2018-08-13 DIAGNOSIS — N410 Acute prostatitis: Secondary | ICD-10-CM | POA: Diagnosis not present

## 2018-08-21 DIAGNOSIS — C61 Malignant neoplasm of prostate: Secondary | ICD-10-CM | POA: Diagnosis not present

## 2018-08-21 DIAGNOSIS — R361 Hematospermia: Secondary | ICD-10-CM | POA: Diagnosis not present

## 2018-09-02 ENCOUNTER — Encounter: Payer: Self-pay | Admitting: Radiation Oncology

## 2018-09-02 NOTE — Progress Notes (Signed)
GU Location of Tumor / Histology: prostatic adenocarcinoma  If Prostate Cancer, Gleason Score is (3 + 3) and PSA is (2.37). Prostate volume: 24.56.   Ian [name] has been an established patient of [facility] since May 2018 when he passed a kidney stone. Recently, patient presented with hematospermia x 3 weeks and known family hx of prostate ca.  Biopsies of prostate (if applicable) revealed:    Past/Anticipated interventions by urology, if any: biopsy, referral for second opinion  Past/Anticipated interventions by medical oncology, if any: no  Weight changes, if any: no  Bowel/Bladder complaints, if any: IPSS 9. SHIM 3. Denies dysuria, hematuria, leakage or incontinence.  Nausea/Vomiting, if any: no  Pain issues, if any:  Chronic back pain related to spinal fusions  SAFETY ISSUES:  Prior radiation? no  Pacemaker/ICD? no  Possible current pregnancy? no, male patient  Is the patient on methotrexate? no  Current Complaints / other details:  73 year old male. Retired. Married with two children.   Dr. Gloriann Loan recommend active surveillance. Patient requested second opinion because of strong family hx.

## 2018-09-03 ENCOUNTER — Telehealth: Payer: Self-pay | Admitting: Radiation Oncology

## 2018-09-04 ENCOUNTER — Encounter: Payer: Self-pay | Admitting: Radiation Oncology

## 2018-09-04 ENCOUNTER — Other Ambulatory Visit: Payer: Self-pay

## 2018-09-04 ENCOUNTER — Ambulatory Visit
Admission: RE | Admit: 2018-09-04 | Discharge: 2018-09-04 | Disposition: A | Discharge status: Home / Self Care | Payer: Medicare Other | Source: Ambulatory Visit | Attending: Radiation Oncology | Admitting: Radiation Oncology

## 2018-09-04 VITALS — Ht 71.0 in | Wt 185.0 lb

## 2018-09-04 DIAGNOSIS — C61 Malignant neoplasm of prostate: Secondary | ICD-10-CM

## 2018-09-04 DIAGNOSIS — Z8042 Family history of malignant neoplasm of prostate: Secondary | ICD-10-CM | POA: Diagnosis not present

## 2018-09-04 DIAGNOSIS — R972 Elevated prostate specific antigen [PSA]: Secondary | ICD-10-CM | POA: Diagnosis not present

## 2018-09-04 HISTORY — DX: Malignant neoplasm of prostate: C61

## 2018-09-04 NOTE — Progress Notes (Signed)
See progress note under physician encounter. 

## 2018-09-04 NOTE — Progress Notes (Signed)
Radiation Oncology         [phone] ________________________________  Initial WebEx Consultation  Name: Ian [name] MRN: [id number]  Date: 09/04/2018  DOB: February 24, 1946  XY:[id number], Christiane Ha, MD  Lucas Mallow, MD   REFERRING PHYSICIAN: Lucas Mallow, MD  DIAGNOSIS: 73 y.o. 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 Malignant neoplasm of prostate ([place]) C61     HISTORY OF PRESENT ILLNESS: Ian [name] is a 73 y.o. male with a diagnosis of prostate cancer. He is an established patient with [facility] since 08/2016 after experiencing hematuria following passing a kidney stone. He presented to Dr. Gloriann Loan on 08/07/2018 with hematospermia.  A digital rectal examination was performed at that time revealing possible 1 cm raised area on the right towards the base and PSA obtained that day was 2.37. He has a very strong family history of prostate cancer in his father, brother and a paternal uncle. Therefore, the patient proceeded to transrectal ultrasound with 12 biopsies of the prostate on 08/13/2018.  The prostate volume measured 24.56 cc.  Out of 12 core biopsies, 3 were positive and 1 with HG/PIN.  The maximum Gleason score was 3+3, and this was seen in left apex lateral, left base, and right mid. HG/PIN was seen in left base lateral.   The patient reviewed the biopsy results with his urologist and he has kindly been referred today for discussion of potential radiation treatment options.   PREVIOUS RADIATION THERAPY: No  PAST MEDICAL HISTORY:  Past Medical History:  Diagnosis Date  . Arthritis   . Atherosclerosis of aorta ([place])   . CAD S/P percutaneous coronary angioplasty cardiologist-  dr Ellyn Hack   06-28-2002  Cardiac Cath subtotal occlusion of D1 -- staged PCI with Taxus DES 2.5 x 12; 08/2011: Cath for persistent CP despite normal Myoview -> 30% pLAD & pD1 with patent D1 stent.  EF ~45%.  . Diverticulosis    . GERD (gastroesophageal reflux disease)   . History of atrial fibrillation without current medication    one episode in 2011-- per cardiologist note has not had any other issues  . History of basal cell carcinoma (BCC) excision    x2  forehead  . History of kidney stones   . History of melanoma excision    scalp s/p moh's  . HTN (hypertension)   . Hypercholesterolemia   . Lung nodule    rt lower lobe  . Prostate cancer ([place])   . Renal cyst   . S/P drug eluting coronary stent placement 06/28/2002   x1  to D1  . Varicose veins    wear teds  . Wears glasses   . Wears hearing aid in both ears       PAST SURGICAL HISTORY: Past Surgical History:  Procedure Laterality Date  . BRACHIAL NERVE REPAIR Left 2007   approx.   RELEASE  . BUNIONECTOMY Right 2008   approx.   and Hammer toe correction  . CARPAL TUNNEL RELEASE Right 2018  . CERVICAL FUSION  ~2008;   07-21-2009   dr Carloyn Manner   posterior C2-3 2008 with screws;   cage fusion C2-3 07-21-2009  . CERVICAL GANGLIONECTOMY  1995   C2  posterior  . CORONARY ANGIOPLASTY  06-28-2002   dr Leonia Reeves  @[facility]    PCI and DES x1  to Diagonal 1  . INGUINAL HERNIA REPAIR Left 08/08/2016  Procedure: OPEN LEFT INGUINAL HERNIA REPAIR WITH MESH;  Surgeon: Armandina Gemma, MD;  Location: [facility];  Service: General;  Laterality: Left;  . INSERTION OF MESH Left 08/08/2016   Procedure: INSERTION OF MESH;  Surgeon: Armandina Gemma, MD;  Location: [facility];  Service: General;  Laterality: Left;  . LEFT HEART CATHETERIZATION WITH CORONARY ANGIOGRAM N/A 11/07/2011   Procedure: LEFT HEART CATHETERIZATION WITH CORONARY ANGIOGRAM;  Surgeon: Candee Furbish, MD;  Location: [facility] CATH LAB;  Service: Cardiovascular: - Equivocal persistent symptoms despite nonischemic Myoview. EF ~45%. 30% calcified pLAD, 30% D1 prestent. Widely paent D1 DES stent.  . [place], 2010, 2011   Total 3 surgeries  . POSTERIOR LUMBAR FUSION  07-17 & 21-- 2019  dr Carloyn Manner  @ [facility] in [place]    staged  T10 -- S1  . REVERSE SHOULDER ARTHROPLASTY Right 11/14/2017  . REVERSE SHOULDER ARTHROPLASTY Right 11/13/2017   Procedure: REVERSE RIGHT SHOULDER ARTHROPLASTY;  Surgeon: Justice Britain, MD;  Location: [facility];  Service: Orthopedics;  Laterality: Right;  . TRIGGER FINGER RELEASE Right 09-26-2005   dr sypher  @[facility]    right ring finger    FAMILY HISTORY:  Family History  Problem Relation Age of Onset  . Hypertension Mother   . Dementia Mother   . Coronary artery disease Father   . Esophageal cancer Father   . Prostate cancer Father   . Cancer Sister        breast, one sister  . Cancer - Prostate Brother   . Prostate cancer Brother   . Cervical cancer Maternal Grandmother        colon  . Heart attack Maternal Grandfather 63       He was a smoker  . Prostate cancer Paternal Uncle     SOCIAL HISTORY:  Social History   Socioeconomic History  . Marital status: Married    Spouse name: Not on file  . Number of children: 2  . Years of education: 31  . Highest education level: Not on file  Occupational History  . Occupation: Orthoptist    Comment: Works for [employer] - plans to retire the end of June 2018.  Social Needs  . Financial resource strain: Not on file  . Food insecurity:    Worry: Not on file    Inability: Not on file  . Transportation needs:    Medical: Not on file    Non-medical: Not on file  Tobacco Use  . Smoking status: Never Smoker  . Smokeless tobacco: Never Used  Substance and Sexual Activity  . Alcohol use: No  . Drug use: No  . Sexual activity: Yes  Lifestyle  . Physical activity:    Days per week: Not on file    Minutes per session: Not on file  . Stress: Not on file  Relationships  . Social connections:    Talks on phone: Not on file    Gets together: Not on file    Attends religious service: Not on file    Active member of club or organization: Not on file    Attends meetings of clubs or organizations: Not on file     Relationship status: Not on file  . Intimate partner violence:    Fear of current or ex partner: Not on file    Emotionally abused: Not on file    Physically abused: Not on file    Forced sexual activity: Not on file  Other Topics Concern  .  Not on file  Social History Narrative   He is a married father of 2, grandfather of 88. He currently lives with his wife. 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 He is active, but does not do routine exercise. He does most yard work and is able to walk in the stores etc.    ALLERGIES: Statins; Adhesive [tape]; Codeine; Demerol [meperidine]; Lescol [fluvastatin sodium]; Morphine and related; Oxycodone; Penicillins; Pravachol [pravastatin sodium]; Welchol [colesevelam hcl]; and Zetia [ezetimibe]  MEDICATIONS:  Current Outpatient Medications  Medication Sig Dispense Refill  . aspirin EC 81 MG tablet Take 81 mg by mouth at bedtime.     . cetirizine (ZYRTEC) 10 MG tablet Take 10 mg by mouth at bedtime.     . Coenzyme Q10 (Q-10 CO-ENZYME PO) coenzyme Q10  400 mg capsule   400 mg by oral route.    Marland Kitchen esomeprazole (NEXIUM) 20 MG capsule Take 20 mg by mouth every Monday, Wednesday, and Friday.     . folic acid (FOLVITE) 361 MCG tablet Take 400 mcg by mouth daily.    Marland Kitchen gabapentin (NEURONTIN) 100 MG capsule     . Glucosamine HCl-MSM (GLUCOSAMINE-MSM PO) Take 2 tablets by mouth daily.     Marland Kitchen HYDROcodone-acetaminophen (NORCO) 10-325 MG tablet Take 1 tablet by mouth every 4 (four) hours as needed. 20 tablet 0  . lisinopril (ZESTRIL) 10 MG tablet Take 10 mg by mouth daily.    . methocarbamol (ROBAXIN) 500 MG tablet Take 1 tablet (500 mg total) by mouth every 6 (six) hours as needed for muscle spasms. 30 tablet 2  . Misc Natural Products (TART CHERRY ADVANCED) CAPS Take 2 capsules by mouth daily.     . Riboflavin 100 MG TABS Take 400 mg by mouth daily. Vitamin B2    . sildenafil (VIAGRA) 50 MG tablet Take 50 mg by mouth daily as  needed for erectile dysfunction.    . vitamin C (ASCORBIC ACID) 500 MG tablet Take 1,000 mg by mouth daily.     Marland Kitchen XIIDRA 5 % SOLN Place 1 drop into both eyes 2 (two) times daily as needed for dry eyes.    . magnesium hydroxide (MILK OF MAGNESIA) 400 MG/5ML suspension Take by mouth daily as needed for mild constipation.    . Multiple Vitamins-Minerals (OCUVITE ADULT 50+) CAPS Take 1 capsule by mouth daily.    . nitroGLYCERIN (NITROSTAT) 0.4 MG SL tablet Place 0.4 mg under the tongue every 5 (five) minutes as needed for chest pain.      No current facility-administered medications for this encounter.     REVIEW OF SYSTEMS:  On review of systems, the patient reports that he is doing well overall. He denies any chest pain, shortness of breath, cough, fevers, chills, night sweats, unintended weight changes. He denies any bowel disturbances, and denies abdominal pain, nausea or vomiting. He denies any new musculoskeletal or joint aches or pains. His IPSS was 9, indicating moderate urinary symptoms. His SHIM was 3, indicating he has severe erectile dysfunction. A complete review of systems is obtained and is otherwise negative.     PHYSICAL EXAM:  Wt Readings from Last 3 Encounters:  09/04/18 185 lb (83.9 kg)  05/14/18 187 lb 4 oz (84.9 kg)  05/11/18 187 lb 4 oz (84.9 kg)   Temp Readings from Last 3 Encounters:  05/14/18 (!) 97.4 F (36.3 C)  05/11/18 98.1 F (36.7 C) (Oral)  11/14/17 98.3 F (36.8 C) (  Oral)   BP Readings from Last 3 Encounters:  05/14/18 (!) 170/94  05/11/18 (!) 143/72  04/29/18 122/70   Pulse Readings from Last 3 Encounters:  05/14/18 78  05/11/18 (!) 53  04/29/18 (!) 59   Pain Assessment Pain Score: 0-No pain(chronic back pain related to fusion of thoracic to lumbar spine)/10  In general this is a well appearing Caucasian gentleman in no acute distress. He's alert and oriented x4 and appropriate throughout the examination. Cardiopulmonary assessment is negative  for acute distress and he exhibits normal effort.    KPS = 100  100 - Normal; no complaints; no evidence of disease. 90   - Able to carry on normal activity; minor signs or symptoms of disease. 80   - Normal activity with effort; some signs or symptoms of disease. 21   - Cares for self; unable to carry on normal activity or to do active work. 60   - Requires occasional assistance, but is able to care for most of his personal needs. 50   - Requires considerable assistance and frequent medical care. 81   - Disabled; requires special care and assistance. 24   - Severely disabled; hospital admission is indicated although death not imminent. 41   - Very sick; hospital admission necessary; active supportive treatment necessary. 10   - Moribund; fatal processes progressing rapidly. 0     - Dead  Karnofsky DA, Abelmann [place], Craver LS and Burchenal [facility] [phone]) The use of the nitrogen mustards in the palliative treatment of carcinoma: with particular reference to bronchogenic carcinoma Cancer 1 634-56  LABORATORY DATA:  Lab Results  Component Value Date   WBC 5.7 05/11/2018   HGB 13.9 05/11/2018   HCT 44.3 05/11/2018   MCV 92.1 05/11/2018   PLT 162 05/11/2018   Lab Results  Component Value Date   NA 142 05/11/2018   K 4.6 05/11/2018   CL 108 05/11/2018   CO2 30 05/11/2018   Lab Results  Component Value Date   ALT 23 07/14/2009   AST 26 07/14/2009   ALKPHOS 55 07/14/2009   BILITOT 0.8 07/14/2009     RADIOGRAPHY: No results found.    IMPRESSION/PLAN: 1. 73 y.o. gentleman with Stage T2a adenocarcinoma of the prostate with Gleason Score of 3+3, and PSA of 2.37. We discussed the patient's workup and outlines the nature of prostate cancer in this setting. The patient's T stage, Gleason's score, and PSA put him into the very low risk group. Accordingly, he is eligible for a variety of potential treatment options including active surveillance, brachytherapy, 5.5 weeks of external radiation,  or prostatectomy. We discussed the available radiation techniques, and focused on the details and logistics and delivery. We discussed and outlined the risks, benefits, short and long-term effects associated with radiotherapy and compared and contrasted these with prostatectomy. We discussed the role of SpaceOAR in reducing the rectal toxicity associated with radiotherapy.    At the end of the conversation the patient is interested in continuing with close monitoring in active surveillance. He would like to proceed with prostate MRI in 3-4 months to ensure there is no evidence of aggressive appearing disease and consider proceeding with an MR fusion biopsy pending those results.  We will share our discussion with Dr. Gloriann Loan and look forward to following his progress.  He is leaning towards brachytherapy as his definitive treatment.  We would be more than happy to continue participating in the care of this very nice gentleman should he elect to  proceed with definitive radiotherapy for treatment of his prostate cancer.   Given current concerns for patient exposure during the COVID-19 pandemic, this encounter was conducted via video-enabled WebEx. The patient has given verbal consent for this type of encounter.  The time spent during this encounter was 70 minutes. The attendants for this meeting include Tyler Pita MD, Ashlyn Bruning PA-C, [place], patient, Ian [name]. During the encounter, Tyler Pita MD, Ashlyn Bruning PA-C, and scribe, Wilburn Mylar were located at [facility] Radiation Oncology Department.  Patient, Ian [name] and his wife Ian [name] were located at home.     Nicholos Johns, PA-C    Tyler Pita, MD  [facility] Direct Dial: [phone]  Fax: [phone] [email or web address]  Skype  [email or web address]   This document serves as a record of services personally performed by Tyler Pita, MD and Freeman Caldron, PA-C. It  was created on their behalf by Wilburn Mylar, a trained medical scribe. The creation of this record is based on the scribe's personal observations and the provider's statements to them. This document has been checked and approved by the attending provider.

## 2018-09-06 DIAGNOSIS — C61 Malignant neoplasm of prostate: Secondary | ICD-10-CM | POA: Insufficient documentation

## 2018-09-15 ENCOUNTER — Telehealth: Payer: Self-pay | Admitting: Medical Oncology

## 2018-09-15 NOTE — Telephone Encounter (Signed)
Called Ian [name] to introduce myself  as the prostate nurse navigator and discuss my role. I was unable to meet him during his consult with Dr. Tammi Klippel 5/22. He currently has low grade prostate cancer has chosen  to continue active surveillance. He will follow up with Dr. Gloriann Loan with a  MRI in 3-4 months and a  fusion biopsy.I informed him that I am working remotely and asked him to call me with questions or concerns and I will return his call.

## 2018-09-22 DIAGNOSIS — C61 Malignant neoplasm of prostate: Secondary | ICD-10-CM | POA: Diagnosis not present

## 2018-09-22 DIAGNOSIS — N402 Nodular prostate without lower urinary tract symptoms: Secondary | ICD-10-CM | POA: Diagnosis not present

## 2018-09-23 ENCOUNTER — Other Ambulatory Visit: Payer: Self-pay | Admitting: Urology

## 2018-09-23 DIAGNOSIS — C61 Malignant neoplasm of prostate: Secondary | ICD-10-CM

## 2018-09-24 DIAGNOSIS — M5412 Radiculopathy, cervical region: Secondary | ICD-10-CM | POA: Diagnosis not present

## 2018-09-30 DIAGNOSIS — M5412 Radiculopathy, cervical region: Secondary | ICD-10-CM | POA: Diagnosis not present

## 2018-10-06 DIAGNOSIS — M25512 Pain in left shoulder: Secondary | ICD-10-CM | POA: Diagnosis not present

## 2018-10-13 DIAGNOSIS — M25512 Pain in left shoulder: Secondary | ICD-10-CM | POA: Diagnosis not present

## 2018-10-20 DIAGNOSIS — M25512 Pain in left shoulder: Secondary | ICD-10-CM | POA: Diagnosis not present

## 2018-10-21 ENCOUNTER — Ambulatory Visit (INDEPENDENT_AMBULATORY_CARE_PROVIDER_SITE_OTHER): Payer: Medicare Other | Admitting: Cardiology

## 2018-10-21 ENCOUNTER — Encounter: Payer: Self-pay | Admitting: Cardiology

## 2018-10-21 ENCOUNTER — Other Ambulatory Visit: Payer: Self-pay

## 2018-10-21 VITALS — BP 140/72 | HR 59 | Temp 98.2°F | Ht 70.5 in | Wt 187.2 lb

## 2018-10-21 DIAGNOSIS — Z01810 Encounter for preprocedural cardiovascular examination: Secondary | ICD-10-CM

## 2018-10-21 DIAGNOSIS — I2510 Atherosclerotic heart disease of native coronary artery without angina pectoris: Secondary | ICD-10-CM | POA: Diagnosis not present

## 2018-10-21 DIAGNOSIS — Z8679 Personal history of other diseases of the circulatory system: Secondary | ICD-10-CM

## 2018-10-21 DIAGNOSIS — R5383 Other fatigue: Secondary | ICD-10-CM | POA: Diagnosis not present

## 2018-10-21 DIAGNOSIS — I10 Essential (primary) hypertension: Secondary | ICD-10-CM | POA: Insufficient documentation

## 2018-10-21 DIAGNOSIS — L817 Pigmented purpuric dermatosis: Secondary | ICD-10-CM | POA: Diagnosis not present

## 2018-10-21 DIAGNOSIS — Z9861 Coronary angioplasty status: Secondary | ICD-10-CM

## 2018-10-21 DIAGNOSIS — Z85828 Personal history of other malignant neoplasm of skin: Secondary | ICD-10-CM | POA: Diagnosis not present

## 2018-10-21 DIAGNOSIS — D1801 Hemangioma of skin and subcutaneous tissue: Secondary | ICD-10-CM | POA: Diagnosis not present

## 2018-10-21 DIAGNOSIS — Z85820 Personal history of malignant melanoma of skin: Secondary | ICD-10-CM | POA: Diagnosis not present

## 2018-10-21 DIAGNOSIS — L821 Other seborrheic keratosis: Secondary | ICD-10-CM | POA: Diagnosis not present

## 2018-10-21 DIAGNOSIS — I2089 Other forms of angina pectoris: Secondary | ICD-10-CM | POA: Insufficient documentation

## 2018-10-21 DIAGNOSIS — R079 Chest pain, unspecified: Secondary | ICD-10-CM | POA: Diagnosis not present

## 2018-10-21 DIAGNOSIS — L570 Actinic keratosis: Secondary | ICD-10-CM | POA: Diagnosis not present

## 2018-10-21 DIAGNOSIS — I208 Other forms of angina pectoris: Secondary | ICD-10-CM | POA: Insufficient documentation

## 2018-10-21 DIAGNOSIS — L738 Other specified follicular disorders: Secondary | ICD-10-CM | POA: Diagnosis not present

## 2018-10-21 DIAGNOSIS — D692 Other nonthrombocytopenic purpura: Secondary | ICD-10-CM | POA: Diagnosis not present

## 2018-10-21 MED ORDER — ISOSORBIDE MONONITRATE ER 30 MG PO TB24: 30.0000 mg | ORAL_TABLET | Freq: Every day | ORAL | 3 refills | Status: DC

## 2018-10-21 NOTE — Assessment & Plan Note (Signed)
Blood pressures little high today on ACE inhibitor alone.  He has been intolerant of beta-blocker because of bradycardia in the past, would potentially consider amlodipine if pressure remains elevated.

## 2018-10-21 NOTE — Assessment & Plan Note (Signed)
Distant history of PCI back in 2004 was stable disease in 2013 by cath.  Patent stent in the LAD. Now having concerning symptoms of chest tightness and throat tightness that are concerning for possible anginal symptoms, however they are not made worse with exertion.  This plus his fatigue leads to their concern for this being at least moderate risk.  As far as from cardiac standpoint he is not on a beta-blocker because of resting bradycardia and history of fatigue (with him now complaining of fatigue I would certainly not add).  He is on an ACE inhibitor and aspirin.  Not on statin because of intolerance.  Plan: Add Imdur 30 mg daily. Lexiscan Myoview

## 2018-10-21 NOTE — Progress Notes (Signed)
PCP: Lajean Manes, MD  Surgeon: Dr. Armandina Gemma [facility] ([facility])  Clinic Note: No chief complaint on file.   HPI: Ian [name] is a 73 y.o. male with a Distant PMH of CAD-PCI & reported h/o Afib who is being seen today for delayed f/u & Pre-operative Cardiovascular Examination -at the request of Dr. Lennette Bihari supple for planned shoulder surgery.  ROMIR KLIMOWICZ has a distant history of known coronary disease status post PCI to the first diagonal branch back in 2004. He then had a follow-up catheterization in 2013 showed the stent was open with 30% stenosis in the LAD and diagonal with an EF of roughly 45%. He was then discharged from cardiology and has been followed by his primary care physician since.  I saw Ian [name] for preop evaluation in April 2018 for upcoming inguinal hernia surgery.  -->  He did not follow back up again because it was thought that he was moving to [place] following his surgery.   I recently saw him in January for preop evaluation for shoulder surgery.  We decided that since he is walking almost daily and doing stationary bicycle without any issues, that he did not require any further cardiac evaluation prior to surgery. --> He did well with his shoulder surgeries: Right shoulder replacement and left shoulder cleanout.  Recent Hospitalizations: n/a  Studies Reviewed: reports & films reviewed personally  No new studies  Interval History: Ian [name] presents today actually noticing that he now has been having some intermittent episodes of chest tightness/pressure as well as jaw discomfort.  Episodes lasting maybe 3 to 22minutes and are most the time happening while he is at rest.  He is not necessarily noted at all with exertion.  He is not doing as much exercise as he used to, partly because he is worried about these chest discomfort issues that been going on for the last couple weeks.  But really since the beginning of the COVID lockdown timeframe he was not able  to really fully recover from his shoulder surgery and just has not got back to his routine exercise regimen. 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  Unfortunately is not really doing the same amount of walking that he had done before.  [name]: positive for - chest pain and Fatigue, lack of exercise tolerance negative for - dyspnea on exertion, edema, irregular heartbeat, orthopnea, palpitations, paroxysmal nocturnal dyspnea, rapid heart rate, shortness of breath or Syncope/near syncope, TIA shows amaurosis fugax.  Claudication.   The patient does not have symptoms concerning for COVID-19 infection (fever, chills, cough, or new shortness of breath).  The patient is practicing social distancing.   COVID-19 Education: The signs and symptoms of COVID-19 were discussed with the patient and how to seek care for testing (follow up with PCP or arrange E-visit).   The importance of social distancing was discussed today.   ROS: A comprehensive was performed. Review of Systems  Constitutional: Negative for fever, malaise/fatigue and weight loss (Intentional).  HENT: Negative for congestion, hearing loss and nosebleeds.   Respiratory: Negative for cough, shortness of breath and wheezing.   Cardiovascular: Positive for leg swelling (Left lower leg).  Gastrointestinal: Negative for blood in stool, heartburn and melena.  Genitourinary: Negative for hematuria.       Left inguinal soreness from his hernia  Musculoskeletal: Negative for falls, joint pain (Shoulder pain is  much better) and myalgias.       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 He therefore cannot do extensive walking, still exercises.  Neurological: Negative for dizziness, tingling and focal weakness.  Endo/Heme/Allergies: Negative for environmental allergies.  Psychiatric/Behavioral: Negative.   All other systems reviewed  and are negative.   Past Medical History:  Diagnosis Date   Arthritis    Atherosclerosis of aorta (HCC)    CAD S/P percutaneous coronary angioplasty cardiologist-  dr Ellyn Hack   06-28-2002  Cardiac Cath subtotal occlusion of D1 -- staged PCI with Taxus DES 2.5 x 12; 08/2011: Cath for persistent CP despite normal Myoview -> 30% pLAD & pD1 with patent D1 stent.  EF ~45%.   Diverticulosis    GERD (gastroesophageal reflux disease)    History of atrial fibrillation without current medication    one episode in 2011-- per cardiologist note has not had any other issues   History of basal cell carcinoma (BCC) excision    x2  forehead   History of kidney stones    History of melanoma excision    scalp s/p moh's   HTN (hypertension)    Hypercholesterolemia    Lung nodule    rt lower lobe   Prostate cancer ([place])    Renal cyst    S/P drug eluting coronary stent placement 06/28/2002   x1  to D1   Varicose veins    wear teds   Wears glasses    Wears hearing aid in both ears     Past Surgical History:  Procedure Laterality Date   BRACHIAL NERVE REPAIR Left 2007   approx.   RELEASE   BUNIONECTOMY Right 2008   approx.   and Hammer toe correction   CARPAL TUNNEL RELEASE Right 2018   CERVICAL FUSION  ~2008;   07-21-2009   dr Carloyn Manner   posterior C2-3 2008 with screws;   cage fusion C2-3 07-21-2009   CERVICAL GANGLIONECTOMY  1995   C2  posterior   CORONARY ANGIOPLASTY  06-28-2002   dr Leonia Reeves  @[facility]    PCI and DES x1  to Diagonal 1   INGUINAL HERNIA REPAIR Left 08/08/2016   Procedure: OPEN LEFT INGUINAL HERNIA REPAIR WITH MESH;  Surgeon: Armandina Gemma, MD;  Location: [facility];  Service: General;  Laterality: Left;   INSERTION OF MESH Left 08/08/2016   Procedure: INSERTION OF MESH;  Surgeon: Armandina Gemma, MD;  Location: [facility];  Service: General;  Laterality: Left;   LEFT HEART CATHETERIZATION WITH CORONARY ANGIOGRAM N/A 11/07/2011   Procedure: LEFT HEART CATHETERIZATION WITH  CORONARY ANGIOGRAM;  Surgeon: Candee Furbish, MD;  Location: [facility] CATH LAB;  Service: Cardiovascular: - Equivocal persistent symptoms despite nonischemic Myoview. EF ~45%. 30% calcified pLAD, 30% D1 prestent. Widely paent D1 DES stent.   [place], 2010, 2011   Total 3 surgeries   POSTERIOR LUMBAR FUSION  07-17 & 21-- 2019  dr Carloyn Manner  @ [facility] in [place]   staged  T10 -- S1   REVERSE SHOULDER ARTHROPLASTY Right 11/14/2017   REVERSE SHOULDER ARTHROPLASTY Right 11/13/2017   Procedure: REVERSE RIGHT SHOULDER ARTHROPLASTY;  Surgeon: Justice Britain, MD;  Location: [facility];  Service: Orthopedics;  Laterality: Right;   TRIGGER FINGER RELEASE Right 09-26-2005   dr sypher  @[facility]    right ring finger    Current Meds  Medication Sig   aspirin EC 81 MG tablet Take 81 mg by mouth at bedtime.    cetirizine (ZYRTEC) 10  MG tablet Take 10 mg by mouth at bedtime.    Coenzyme Q10 (Q-10 CO-ENZYME PO) coenzyme Q10  400 mg capsule   400 mg by oral route.   esomeprazole (NEXIUM) 20 MG capsule Take 20 mg by mouth every Monday, Wednesday, and Friday.    folic acid (FOLVITE) 076 MCG tablet Take 400 mcg by mouth daily.   gabapentin (NEURONTIN) 100 MG capsule    Glucosamine HCl-MSM (GLUCOSAMINE-MSM PO) Take 2 tablets by mouth daily.    HYDROcodone-acetaminophen (NORCO) 10-325 MG tablet Take 1 tablet by mouth every 4 (four) hours as needed.   lisinopril (ZESTRIL) 10 MG tablet Take 10 mg by mouth daily.   magnesium hydroxide (MILK OF MAGNESIA) 400 MG/5ML suspension Take by mouth daily as needed for mild constipation.   methocarbamol (ROBAXIN) 500 MG tablet Take 1 tablet (500 mg total) by mouth every 6 (six) hours as needed for muscle spasms.   Misc Natural Products (TART CHERRY ADVANCED) CAPS Take 2 capsules by mouth daily.    Multiple Vitamins-Minerals (OCUVITE ADULT 50+) CAPS Take 1 capsule by mouth daily.   nitroGLYCERIN (NITROSTAT) 0.4 MG SL tablet Place 0.4 mg under the tongue every 5 (five)  minutes as needed for chest pain.    Riboflavin 100 MG TABS Take 400 mg by mouth daily. Vitamin B2   sildenafil (VIAGRA) 50 MG tablet Take 50 mg by mouth daily as needed for erectile dysfunction.   vitamin C (ASCORBIC ACID) 500 MG tablet Take 1,000 mg by mouth daily.    XIIDRA 5 % SOLN Place 1 drop into both eyes 2 (two) times daily as needed for dry eyes.    Allergies  Allergen Reactions   Statins    Adhesive [Tape] Rash and Other (See Comments)    Band aid adhesive ---- RASH Skin peels off   Codeine Nausea And Vomiting   Demerol [Meperidine] Nausea And Vomiting   Lescol [Fluvastatin Sodium] Other (See Comments)    Muscle pain    Morphine And Related Nausea And Vomiting   Oxycodone Nausea And Vomiting   Penicillins Nausea And Vomiting and Other (See Comments)    Has patient had a PCN reaction causing immediate rash, facial/tongue/throat swelling, SOB or lightheadedness with hypotension: No Has patient had a PCN reaction causing severe rash involving mucus membranes or skin necrosis: No Has patient had a PCN reaction that required hospitalization No Has patient had a PCN reaction occurring within the last 10 years: No If all of the above answers are "NO", then may proceed with Cephalosporin use.    Pravachol [Pravastatin Sodium] Other (See Comments)    Muscle pain    Welchol [Colesevelam Hcl] Other (See Comments)    Weakness    Zetia [Ezetimibe] Other (See Comments)    Muscle pain    Social History   Tobacco Use   Smoking status: Never Smoker   Smokeless tobacco: Never Used  Substance Use Topics   Alcohol use: No   Drug use: No   Social History   Social History Narrative   He is a married father of 2, grandfather of 68. He currently lives with his wife. 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 He is active, but does not do routine exercise. He does most yard work and is able to walk in the stores etc.   family  history includes Cancer in his sister; Cancer - Prostate in his brother; Cervical cancer in his maternal grandmother; Coronary artery  disease in his father; Dementia in his mother; Esophageal cancer in his father; Heart attack (age of onset: 32) in his maternal grandfather; Hypertension in his mother; Prostate cancer in his brother, father, and paternal uncle.  Wt Readings from Last 3 Encounters:  10/21/18 187 lb 3.2 oz (84.9 kg)  09/04/18 185 lb (83.9 kg)  05/14/18 187 lb 4 oz (84.9 kg)    PHYSICAL EXAM BP 140/72    Pulse (!) 59    Temp 98.2 F (36.8 C) (Temporal)    Ht 5' 10.5" (1.791 m)    Wt 187 lb 3.2 oz (84.9 kg)    SpO2 100%    BMI 26.48 kg/m   Physical Exam  Constitutional: He is oriented to person, place, and time. He appears well-developed. No distress.  Healthy-appearing.  Well-nourished and well-groomed.  HENT:  Head: Normocephalic and atraumatic.  Neck: Neck supple. No hepatojugular reflux and no JVD present. Carotid bruit is not present.  Cardiovascular: Normal rate, regular rhythm, normal heart sounds and intact distal pulses.  No extrasystoles are present. PMI is not displaced. Exam reveals no gallop and no friction rub.  No murmur heard. Pulmonary/Chest: Breath sounds normal. No respiratory distress. He has no wheezes. He has no rales.  Abdominal: Soft. Bowel sounds are normal. He exhibits no distension. There is no abdominal tenderness. There is no rebound.  Musculoskeletal: Normal range of motion.        General: Edema (Left> right lower leg swelling.  No venous stasis changes.) present.     Comments: L>R plump varicose veins.  Non-tender.   Neurological: He is alert and oriented to person, place, and time.  Psychiatric: He has a normal mood and affect. His behavior is normal. Judgment and thought content normal.  Vitals reviewed.   Adult ECG Report - no EKG from today. Rate: 59;  Rhythm: sinus bradycardia and Normal axis, intervals and durations.;  Borderline IVCD,  poor R wave progression.  Narrative Interpretation: Relatively stable.  (EKG from last August looks very different, but no concerning differences)  Other studies Reviewed: Additional studies/ records that were reviewed today include:  Recent Labs:  Labs are followed by PCP  ASSESSMENT / PLAN: Problem List Items Addressed This Visit    Preoperative cardiovascular examination - Primary   Relevant Orders   EKG 12-Lead   MYOCARDIAL PERFUSION IMAGING   History of atrial fibrillation without current medication (Chronic)    This patients CHA2DS2-VASc Score and unadjusted Ischemic Stroke Rate (% per year) is equal to 3.2 % stroke rate/year from a score of 3  Above score calculated as 1 point each if present [CHF, HTN, DM, Vascular=MI/PAD/Aortic Plaque, Age if 65-74, or Male] Above score calculated as 2 points each if present [Age > 75, or Stroke/TIA/TE]  No recurrence of A. fib, therefore not on anticoagulation.  Is only on aspirin.  In the absence of any recurrence, would avoid further treatment.      Fatigue    He mentioned this as he is leaving.  Would certainly be detriment adding beta-blocker.  I also adds the concern for possible being gram cardiac in nature.  We will evaluate for ischemia, if negative, would then need to evaluate for other etiologies such as TSH and hemoglobin levels etc.      Relevant Orders   EKG 12-Lead   MYOCARDIAL PERFUSION IMAGING   Essential hypertension (Chronic)    Blood pressures little high today on ACE inhibitor alone.  He has been intolerant of beta-blocker because  of bradycardia in the past, would potentially consider amlodipine if pressure remains elevated.      Relevant Medications   isosorbide mononitrate (IMDUR) 30 MG 24 hr tablet   Chest pain with moderate risk for cardiac etiology    Somewhat typical description of symptom, however in hip atypical and that is not made worse with exertion.  At least moderate if not high risk given his risk  factors.  Plan: So [facility], and if symptoms persist or get worse despite negative Myoview I would have low threshold to consider cardiac catheterization. Add Imdur 30 mg daily.      Relevant Orders   EKG 12-Lead   MYOCARDIAL PERFUSION IMAGING   CAD S/P percutaneous coronary angioplasty (Chronic)    Distant history of PCI back in 2004 was stable disease in 2013 by cath.  Patent stent in the LAD. Now having concerning symptoms of chest tightness and throat tightness that are concerning for possible anginal symptoms, however they are not made worse with exertion.  This plus his fatigue leads to their concern for this being at least moderate risk.  As far as from cardiac standpoint he is not on a beta-blocker because of resting bradycardia and history of fatigue (with him now complaining of fatigue I would certainly not add).  He is on an ACE inhibitor and aspirin.  Not on statin because of intolerance.  Plan: Add Imdur 30 mg daily. Lexiscan Myoview      Relevant Medications   isosorbide mononitrate (IMDUR) 30 MG 24 hr tablet   Other Relevant Orders   EKG 12-Lead   MYOCARDIAL PERFUSION IMAGING     Since he will be moving to [place], I don't think he needs to follow-up with [place] from a cardiology standpoint as he has been stable. Continue to manage his blood sugars lipids and blood pressure.   Current medicines are reviewed at length with the patient today. (+/- concerns) n/a The following changes have been made: n/a  Patient Instructions  Medication Instructions:  START ISOSORBIDE MN ( IMDUR)   30 MG -TAKE ONE TABLET BEDTIME.  DO NOT USE VIAGRA WITHIN A 48 HOUR USE OF IMDUR --  If you need a refill on your cardiac medications before your next appointment, please call your pharmacy.   Lab work: NOT NEEDED  Testing/Procedures: WILL BE SCHEDULE AT [place] [address] Your physician has requested that you have a lexiscan myoview. For further information please  visit [email or web address]. Please follow instruction sheet, as given.   Follow-Up: At [facility], you and your health needs are our priority.  As part of our continuing mission to provide you with exceptional heart care, we have created designated Provider Care Teams.  These Care Teams include your primary Cardiologist (physician) and Advanced Practice Providers (APPs -  Physician Assistants and Nurse Practitioners) who all work together to provide you with the care you need, when you need it.  You will need a follow up appointment in  3 months.  Please call our office 2 months in advance to schedule this appointment.  You may see Glenetta Hew, MD or one of the following Advanced Practice Providers on your designated Care Team:    Rosaria Ferries, PA-C  Jory Sims, DNP, ANP  Any Other Special Instructions Will Be Listed Below (If Applicable).  Studies Ordered:   Orders Placed This Encounter  Procedures   MYOCARDIAL PERFUSION IMAGING   EKG 12-Lead      Glenetta Hew, M.D., M.S. Interventional Cardiologist  Pager # [phone] Phone # [phone] [address] [place], [place] [address]

## 2018-10-21 NOTE — Assessment & Plan Note (Signed)
Somewhat typical description of symptom, however in hip atypical and that is not made worse with exertion.  At least moderate if not high risk given his risk factors.  Plan: [facility], and if symptoms persist or get worse despite negative Myoview I would have low threshold to consider cardiac catheterization. Add Imdur 30 mg daily.

## 2018-10-21 NOTE — Patient Instructions (Addendum)
Medication Instructions:  START ISOSORBIDE MN ( IMDUR)   30 MG -TAKE ONE TABLET BEDTIME.  DO NOT USE VIAGRA WITHIN A 48 HOUR USE OF IMDUR --  If you need a refill on your cardiac medications before your next appointment, please call your pharmacy.   Lab work: NOT NEEDED  Testing/Procedures: WILL BE SCHEDULE AT [place] [facility] Your physician has requested that you have a lexiscan myoview. For further information please visit [email or web address]. Please follow instruction sheet, as given.   Follow-Up: At [facility], you and your health needs are our priority.  As part of our continuing mission to provide you with exceptional heart care, we have created designated Provider Care Teams.  These Care Teams include your primary Cardiologist (physician) and Advanced Practice Providers (APPs -  Physician Assistants and Nurse Practitioners) who all work together to provide you with the care you need, when you need it. . You will need a follow up appointment in  3 months.  Please call our office 2 months in advance to schedule this appointment.  You may see Glenetta Hew, MD or one of the following Advanced Practice Providers on your designated Care Team:   . Rosaria Ferries, PA-C . Jory Sims, DNP, ANP  Any Other Special Instructions Will Be Listed Below (If Applicable).

## 2018-10-21 NOTE — Assessment & Plan Note (Signed)
He mentioned this as he is leaving.  Would certainly be detriment adding beta-blocker.  I also adds the concern for possible being gram cardiac in nature.  We will evaluate for ischemia, if negative, would then need to evaluate for other etiologies such as TSH and hemoglobin levels etc.

## 2018-10-21 NOTE — Assessment & Plan Note (Signed)
This patients CHA2DS2-VASc Score and unadjusted Ischemic Stroke Rate (% per year) is equal to 3.2 % stroke rate/year from a score of 3  Above score calculated as 1 point each if present [CHF, HTN, DM, Vascular=MI/PAD/Aortic Plaque, Age if 65-74, or Male] Above score calculated as 2 points each if present [Age > 75, or Stroke/TIA/TE]  No recurrence of A. fib, therefore not on anticoagulation.  Is only on aspirin.  In the absence of any recurrence, would avoid further treatment.

## 2018-10-22 ENCOUNTER — Telehealth: Payer: Self-pay

## 2018-10-22 NOTE — Telephone Encounter (Signed)
No - just list as intolerance Will wait for Myoview Glenetta Hew, MD

## 2018-10-22 NOTE — Telephone Encounter (Signed)
Pt called into clinic he states that he started isosorbide last night and he woke up this morning with a terrible headache and feeling a little "woozy" he will not continue to take this medication. His BP this morning is 90/53 HR 74. He states that he will CB if any other sx arise.  Do you want to start another medication? Please advise.

## 2018-10-23 NOTE — Telephone Encounter (Signed)
PATIENT AWARE OF HARDING COMMENTS. PLACED  IMDUR AS INTOLERENCE

## 2018-10-27 DIAGNOSIS — M5412 Radiculopathy, cervical region: Secondary | ICD-10-CM | POA: Diagnosis not present

## 2018-10-30 DIAGNOSIS — Z7189 Other specified counseling: Secondary | ICD-10-CM | POA: Diagnosis not present

## 2018-10-30 DIAGNOSIS — Z03818 Encounter for observation for suspected exposure to other biological agents ruled out: Secondary | ICD-10-CM | POA: Diagnosis not present

## 2018-11-03 DIAGNOSIS — Z20828 Contact with and (suspected) exposure to other viral communicable diseases: Secondary | ICD-10-CM | POA: Diagnosis not present

## 2018-11-04 ENCOUNTER — Telehealth (HOSPITAL_COMMUNITY): Payer: Self-pay

## 2018-11-04 NOTE — Telephone Encounter (Signed)
Encounter complete. 

## 2018-11-05 ENCOUNTER — Other Ambulatory Visit: Payer: Self-pay

## 2018-11-05 ENCOUNTER — Ambulatory Visit (HOSPITAL_COMMUNITY)
Admission: RE | Admit: 2018-11-05 | Discharge: 2018-11-05 | Disposition: A | Discharge status: Home / Self Care | Payer: Medicare Other | Source: Ambulatory Visit | Attending: Cardiology | Admitting: Cardiology

## 2018-11-05 DIAGNOSIS — I2510 Atherosclerotic heart disease of native coronary artery without angina pectoris: Secondary | ICD-10-CM | POA: Diagnosis not present

## 2018-11-05 DIAGNOSIS — R5383 Other fatigue: Secondary | ICD-10-CM

## 2018-11-05 DIAGNOSIS — Z01810 Encounter for preprocedural cardiovascular examination: Secondary | ICD-10-CM | POA: Insufficient documentation

## 2018-11-05 DIAGNOSIS — Z9861 Coronary angioplasty status: Secondary | ICD-10-CM | POA: Diagnosis not present

## 2018-11-05 DIAGNOSIS — R079 Chest pain, unspecified: Secondary | ICD-10-CM | POA: Insufficient documentation

## 2018-11-05 LAB — MYOCARDIAL PERFUSION IMAGING
LV dias vol: 168 mL (ref 62–150)
LV sys vol: 97 mL
Peak HR: 88 {beats}/min
Rest HR: 53 {beats}/min
SDS: 2
SRS: 0
SSS: 2
TID: 1

## 2018-11-05 MED ORDER — TECHNETIUM TC 99M TETROFOSMIN IV KIT
10.1000 | PACK | Freq: Once | INTRAVENOUS | Status: AC | PRN
  Administered 2018-11-05: 10.1 via INTRAVENOUS
  Filled 2018-11-05: qty 11

## 2018-11-05 MED ORDER — TECHNETIUM TC 99M TETROFOSMIN IV KIT
32.5000 | PACK | Freq: Once | INTRAVENOUS | Status: AC | PRN
  Administered 2018-11-05: 32.5 via INTRAVENOUS
  Filled 2018-11-05: qty 33

## 2018-11-05 MED ORDER — REGADENOSON 0.4 MG/5ML IV SOLN
0.4000 mg | Freq: Once | INTRAVENOUS | Status: AC
  Administered 2018-11-05: 0.4 mg via INTRAVENOUS

## 2018-11-10 ENCOUNTER — Telehealth: Payer: Self-pay

## 2018-11-10 NOTE — Telephone Encounter (Signed)
I do not know what to say about the results not being called.  Unfortunately, Ian [name] was out last week.  I read the results within 24 hours of the test being done.  The stress test looked pretty good as far as no evidence of any ischemia, however he is having symptoms that are continuing, the thought was maybe we needed to do more.  We could possibly consider cardiac cath if he would like as part of his preop evaluation.  However the stress test was quite reassuring.  The recommendation was to check an echocardiogram, which I still think we should do.   Glenetta Hew, MD

## 2018-11-10 NOTE — Telephone Encounter (Signed)
Spoke to patient myoview results given.Patient stated he is upset that he has not received results before now.Stated he has called office twice and is upset with our phone system.Stated he was the 42 th caller and then he called the next day and he was 52 th caller.Stated he is considering changing to a different practice. Stated something is wrong he continues to have chest pain every day.Stated he has not taken NTG  makes B/P low. Alot of belching.He has been having pain in jaws and sob.Stated pain will last appox 4 to 5 min.Stated pain is similar to pain he had before angioplasty.Appointment scheduled with Kerin Ransom PA 7/29 at 9:15 am.Advised to go to ED if needed.

## 2018-11-11 ENCOUNTER — Other Ambulatory Visit: Payer: Self-pay | Admitting: Cardiology

## 2018-11-11 ENCOUNTER — Telehealth: Payer: Self-pay

## 2018-11-11 ENCOUNTER — Ambulatory Visit (INDEPENDENT_AMBULATORY_CARE_PROVIDER_SITE_OTHER): Payer: Medicare Other | Admitting: Cardiology

## 2018-11-11 ENCOUNTER — Encounter: Payer: Self-pay | Admitting: Cardiology

## 2018-11-11 ENCOUNTER — Other Ambulatory Visit: Payer: Self-pay

## 2018-11-11 VITALS — BP 160/78 | Ht 70.5 in | Wt 184.0 lb

## 2018-11-11 DIAGNOSIS — Z9861 Coronary angioplasty status: Secondary | ICD-10-CM

## 2018-11-11 DIAGNOSIS — I10 Essential (primary) hypertension: Secondary | ICD-10-CM

## 2018-11-11 DIAGNOSIS — Z8679 Personal history of other diseases of the circulatory system: Secondary | ICD-10-CM | POA: Diagnosis not present

## 2018-11-11 DIAGNOSIS — M79641 Pain in right hand: Secondary | ICD-10-CM | POA: Diagnosis not present

## 2018-11-11 DIAGNOSIS — C61 Malignant neoplasm of prostate: Secondary | ICD-10-CM | POA: Diagnosis not present

## 2018-11-11 DIAGNOSIS — I2510 Atherosclerotic heart disease of native coronary artery without angina pectoris: Secondary | ICD-10-CM | POA: Diagnosis not present

## 2018-11-11 DIAGNOSIS — M65351 Trigger finger, right little finger: Secondary | ICD-10-CM | POA: Diagnosis not present

## 2018-11-11 DIAGNOSIS — R079 Chest pain, unspecified: Secondary | ICD-10-CM

## 2018-11-11 MED ORDER — NITROGLYCERIN 0.4 MG SL SUBL: 0.4000 mg | SUBLINGUAL_TABLET | SUBLINGUAL | 3 refills | Status: AC | PRN

## 2018-11-11 NOTE — Assessment & Plan Note (Signed)
NSR today- he is not on [facility]

## 2018-11-11 NOTE — Progress Notes (Addendum)
Cardiology Office Note:    Date:  11/11/2018   ID:  Ian [name], DOB [birth year], MRN [id number]  PCP:  Lajean Manes, MD  Cardiologist:  Glenetta Hew, MD  Electrophysiologist:  None   Referring MD: Lajean Manes, MD   C/C: Chest tightness  History of Present Illness:    Ian [name] is a 73 y.o. male with a hx of CAD, s/p PCI in 2004 (Dr Leonia Reeves).  He used to be the Radio producer at [employer].  He has  A history of prostate CA, GERD, and statin intolerance. He recently saw dr Ellyn Hack and complained of SSCP.  He describes mid sternal- right sided chest pressure and "tightness".  It radiates to his jaw and between his shoulder blades.  It's not exertional and is better when he lays down.  No associated nausea, vomiting, or SOB.  His symptoms last 4-5 minutes.  He does not take NTG- one dose of Imdur caused his B/P to drop into the 80's and gave him a headache x 2 days.  He says his symptoms are similar to his pre PCI symptoms.  Myoview done 11/05/2018 was low risk-EF 43% similar to past echo.   Past Medical History:  Diagnosis Date  . Arthritis   . Atherosclerosis of aorta ([place])   . CAD S/P percutaneous coronary angioplasty cardiologist-  dr Ellyn Hack   06-28-2002  Cardiac Cath subtotal occlusion of D1 -- staged PCI with Taxus DES 2.5 x 12; 08/2011: Cath for persistent CP despite normal Myoview -> 30% pLAD & pD1 with patent D1 stent.  EF ~45%.  . Diverticulosis   . GERD (gastroesophageal reflux disease)   . History of atrial fibrillation without current medication    one episode in 2011-- per cardiologist note has not had any other issues  . History of basal cell carcinoma (BCC) excision    x2  forehead  . History of kidney stones   . History of melanoma excision    scalp s/p moh's  . HTN (hypertension)   . Hypercholesterolemia   . Lung nodule    rt lower lobe  . Prostate cancer ([place])   . Renal cyst   . S/P drug eluting coronary stent placement 06/28/2002   x1  to D1  .  Varicose veins    wear teds  . Wears glasses   . Wears hearing aid in both ears     Past Surgical History:  Procedure Laterality Date  . BRACHIAL NERVE REPAIR Left 2007   approx.   RELEASE  . BUNIONECTOMY Right 2008   approx.   and Hammer toe correction  . CARPAL TUNNEL RELEASE Right 2018  . CERVICAL FUSION  ~2008;   07-21-2009   dr Carloyn Manner   posterior C2-3 2008 with screws;   cage fusion C2-3 07-21-2009  . CERVICAL GANGLIONECTOMY  1995   C2  posterior  . CORONARY ANGIOPLASTY  06-28-2002   dr Leonia Reeves  @[facility]    PCI and DES x1  to Diagonal 1  . INGUINAL HERNIA REPAIR Left 08/08/2016   Procedure: OPEN LEFT INGUINAL HERNIA REPAIR WITH MESH;  Surgeon: Armandina Gemma, MD;  Location: [facility];  Service: General;  Laterality: Left;  . INSERTION OF MESH Left 08/08/2016   Procedure: INSERTION OF MESH;  Surgeon: Armandina Gemma, MD;  Location: [facility];  Service: General;  Laterality: Left;  . LEFT HEART CATHETERIZATION WITH CORONARY ANGIOGRAM N/A 11/07/2011   Procedure: LEFT HEART CATHETERIZATION WITH CORONARY ANGIOGRAM;  Surgeon: Candee Furbish, MD;  Location: [facility];  Service: Cardiovascular: - Equivocal persistent symptoms despite nonischemic Myoview. EF ~45%. 30% calcified pLAD, 30% D1 prestent. Widely paent D1 DES stent.  . [place], 2010, 2011   Total 3 surgeries  . POSTERIOR LUMBAR FUSION  07-17 & 21-- 2019  dr Carloyn Manner  @ [facility] in [place]   staged  T10 -- S1  . REVERSE SHOULDER ARTHROPLASTY Right 11/14/2017  . REVERSE SHOULDER ARTHROPLASTY Right 11/13/2017   Procedure: REVERSE RIGHT SHOULDER ARTHROPLASTY;  Surgeon: Justice Britain, MD;  Location: [facility];  Service: Orthopedics;  Laterality: Right;  . TRIGGER FINGER RELEASE Right 09-26-2005   dr sypher  @[facility]    right ring finger    Current Medications: Current Meds  Medication Sig  . aspirin EC 81 MG tablet Take 81 mg by mouth at bedtime.   . cetirizine (ZYRTEC) 10 MG tablet Take 10 mg by mouth at bedtime.   . Coenzyme Q10 (Q-10 CO-ENZYME  PO) coenzyme Q10  400 mg capsule   400 mg by oral route.  Marland Kitchen esomeprazole (NEXIUM) 20 MG capsule Take 20 mg by mouth every Monday, Wednesday, and Friday.   . gabapentin (NEURONTIN) 100 MG capsule Take 100 mg by mouth as needed.   . Glucosamine HCl-MSM (GLUCOSAMINE-MSM PO) Take 2 tablets by mouth daily.   Marland Kitchen HYDROcodone-acetaminophen (NORCO) 10-325 MG tablet Take 1 tablet by mouth every 4 (four) hours as needed.  Marland Kitchen lisinopril (ZESTRIL) 10 MG tablet Take 10 mg by mouth daily.  . magnesium hydroxide (MILK OF MAGNESIA) 400 MG/5ML suspension Take by mouth daily as needed for mild constipation.  . methocarbamol (ROBAXIN) 500 MG tablet Take 1 tablet (500 mg total) by mouth every 6 (six) hours as needed for muscle spasms.  . Misc Natural Products (TART CHERRY ADVANCED) CAPS Take 2 capsules by mouth daily.   . Multiple Vitamins-Minerals (OCUVITE ADULT 50+) CAPS Take 1 capsule by mouth daily.  . nitroGLYCERIN (NITROSTAT) 0.4 MG SL tablet Place 0.4 mg under the tongue every 5 (five) minutes as needed for chest pain.   . Riboflavin 100 MG TABS Take 400 mg by mouth daily. Vitamin B2  . sildenafil (VIAGRA) 50 MG tablet Take 50 mg by mouth daily as needed for erectile dysfunction.  . vitamin C (ASCORBIC ACID) 500 MG tablet Take 1,000 mg by mouth daily.   Marland Kitchen XIIDRA 5 % SOLN Place 1 drop into both eyes 2 (two) times daily as needed for dry eyes.     Allergies:   Isosorbide, Statins, Adhesive [tape], Codeine, Demerol [meperidine], Lescol [fluvastatin sodium], Morphine and related, Oxycodone, Penicillins, Pravachol [pravastatin sodium], Welchol [colesevelam hcl], and Zetia [ezetimibe]   Social History   Socioeconomic History  . Marital status: Married    Spouse name: Not on file  . Number of children: 2  . Years of education: 105  . Highest education level: Not on file  Occupational History  . Occupation: Orthoptist    Comment: Works for [employer] - plans to retire the end of June 2018.   Social Needs  . Financial resource strain: Not on file  . Food insecurity    Worry: Not on file    Inability: Not on file  . Transportation needs    Medical: Not on file    Non-medical: Not on file  Tobacco Use  . Smoking status: Never Smoker  . Smokeless tobacco: Never Used  Substance and Sexual Activity  . Alcohol use: No  . Drug use: No  .  Sexual activity: Yes  Lifestyle  . Physical activity    Days per week: Not on file    Minutes per session: Not on file  . Stress: Not on file  Relationships  . Social Herbalist on phone: Not on file    Gets together: Not on file    Attends religious service: Not on file    Active member of club or organization: Not on file    Attends meetings of clubs or organizations: Not on file    Relationship status: Not on file  Other Topics Concern  . Not on file  Social History Narrative   He is a married father of 2, grandfather of 57. He currently lives with his wife. 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 He is active, but does not do routine exercise. He does most yard work and is able to walk in the stores etc.     Family History: The patient's family history includes Cancer in his sister; Cancer - Prostate in his brother; Cervical cancer in his maternal grandmother; Coronary artery disease in his father; Dementia in his mother; Esophageal cancer in his father; Heart attack (age of onset: 45) in his maternal grandfather; Hypertension in his mother; Prostate cancer in his brother, father, and paternal uncle.  ROS:   Please see the history of present illness.     All other systems reviewed and are negative.  EKGs/Labs/Other Studies Reviewed:    The following studies were reviewed today: Myoview 11/05/2018  EKG:  EKG is ordered today.  The ekg ordered today demonstrates NSR, NSST changes in inferior leads.   Recent Labs: 05/11/2018: BUN 14; Creatinine, Ser 0.78; Hemoglobin 13.9; Platelets  162; Potassium 4.6; Sodium 142  Recent Lipid Panel No results found for: CHOL, TRIG, HDL, CHOLHDL, VLDL, LDLCALC, LDLDIRECT  Physical Exam:    VS:  BP (!) 160/78 (BP Location: Left Arm, Patient Position: Sitting, Cuff Size: Normal)   Ht 5' 10.5" (1.791 m)   Wt 184 lb (83.5 kg)   SpO2 98%   BMI 26.03 kg/m     Wt Readings from Last 3 Encounters:  11/11/18 184 lb (83.5 kg)  10/21/18 187 lb 3.2 oz (84.9 kg)  09/04/18 185 lb (83.9 kg)     GEN:  Well nourished, well developed in no acute distress HEENT: Normal NECK: No JVD; No carotid bruits LYMPHATICS: No lymphadenopathy CARDIAC: RRR, no murmurs, rubs, gallops RESPIRATORY:  Clear to auscultation without rales, wheezing or rhonchi  ABDOMEN: Soft, non-tender, non-distended MUSCULOSKELETAL:  No edema; No deformity  SKIN: Warm and dry NEUROLOGIC:  Alert and oriented x 3 PSYCHIATRIC:  Normal affect   ASSESSMENT:    Chest pain with moderate risk for cardiac etiology Will review with Dr Ellyn Hack- consider diagnostic cath  CAD S/P percutaneous coronary angioplasty Diagonal stent 2004.  Cath 11/07/11 - patent stent, minimal stenosis of prox LAD. Minor stenosis just prior to stent. No flow limiting disease.  Myoview low risk 11/05/2018  Essential hypertension 132/ 80 by me  History of atrial fibrillation without current medication NSR today- he is not on [facility]  Malignant neoplasm of prostate ([place]) Followed by Dr Tammi Klippel  PLAN:    Will discuss with Dr Ellyn Hack- ? cath  Addendum: Discussed with dr Ellyn Hack- plan OP cath next week. The patient understands that risks included but are not limited to stroke (1 in 1000), death (1 in 42), kidney failure [usually temporary] (1 in  500), bleeding (1 in 200), allergic reaction [possibly serious] (1 in 200).  The patient understands and agrees to proceed.   Kerin Ransom PA-C 11/11/2018 1:13 PM   Medication Adjustments/Labs and Tests Ordered: Current medicines are reviewed at length with the  patient today.  Concerns regarding medicines are outlined above.  No orders of the defined types were placed in this encounter.  No orders of the defined types were placed in this encounter.   There are no Patient Instructions on file for this visit.   Signed, Kerin Ransom, PA-C  11/11/2018 10:02 AM    [place]

## 2018-11-11 NOTE — Addendum Note (Signed)
Addended by: Ulice Brilliant T on: 11/11/2018 12:05 PM   Modules accepted: Orders

## 2018-11-11 NOTE — Assessment & Plan Note (Signed)
Followed by Dr Tammi Klippel

## 2018-11-11 NOTE — Telephone Encounter (Signed)
Patient came in office and we reviewed instructions in details.          [address] [place] [address] Dept: [phone] Loc: [place]  11/11/2018  You are scheduled for a Cardiac Catheterization on Tuesday, August 4 with Dr. Glenetta Hew.  1. Please arrive at the [facility] (Main Entrance A) at [facility]: [address] [place], [place] [address] at 7:00 AM (This time is two hours before your procedure to ensure your preparation). Free valet parking service is available.   Special note: Every effort is made to have your procedure done on time. Please understand that emergencies sometimes delay scheduled procedures.  2. Diet: Do not eat solid foods after midnight. The patient may have clear liquids until 5am upon the day of the procedure.  3. Labs: You will need to have blood drawn on Friday, July 31 at [place]  Open: [place] (Lunch 12:45 - 1:45)   Phone: [phone]. You do not need to be fasting. No appointment is needed. You must complete your labs before doing your covid-19 test.   COVID TEST- You must have your covid test 4 days before your procedure. I will schedule your covid test for Friday 11/13/2018. Your must quarantine after having your coivd test until the day of your procedure.   Covid test Date and Time 11/13/2018 at 3:20pm. You go to [facility] and follow the arrows.  4. Medication instructions in preparation for your procedure:   Contrast Allergy: No  On the morning of your procedure, take your Aspirin and any morning medicines NOT listed above. You may use sips of water.  5. Plan for one night stay--bring personal belongings. 6. Bring a current list of your medications and current insurance cards. 7. You MUST have a responsible person to drive you home. 8. Someone MUST be with you  the first 24 hours after you arrive home or your discharge will be delayed. 9. Please wear clothes that are easy to get on and off and wear slip-on shoes.  Thank you for allowing [place] to care for you!   -- [facility]

## 2018-11-11 NOTE — Assessment & Plan Note (Signed)
Diagonal stent 2004.  Cath 11/07/11 - patent stent, minimal stenosis of prox LAD. Minor stenosis just prior to stent. No flow limiting disease.  Myoview low risk 11/05/2018

## 2018-11-11 NOTE — Patient Instructions (Signed)
Medication Instructions:  Your physician recommends that you continue on your current medications as directed. Please refer to the Current Medication list given to you today. If you need a refill on your cardiac medications before your next appointment, please call your pharmacy.   Lab work: None  If you have labs (blood work) drawn today and your tests are completely normal, you will receive your results only by: Marland Kitchen MyChart Message (if you have MyChart) OR . A paper copy in the mail If you have any lab test that is abnormal or we need to change your treatment, we will call you to review the results.  Testing/Procedures: None   Follow-Up: At [facility], you and your health needs are our priority.  As part of our continuing mission to provide you with exceptional heart care, we have created designated Provider Care Teams.  These Care Teams include your primary Cardiologist (physician) and Advanced Practice Providers (APPs -  Physician Assistants and Nurse Practitioners) who all work together to provide you with the care you need, when you need it. . Someone from our office will call you and discuss the plan once we hear back from Dr Ellyn Hack.  Any Other Special Instructions Will Be Listed Below (If Applicable).

## 2018-11-11 NOTE — Assessment & Plan Note (Signed)
132/ 80 by me

## 2018-11-11 NOTE — H&P (View-Only) (Signed)
Cardiology Office Note:    Date:  11/11/2018   ID:  Ian [name], DOB 09-Oct-1945, MRN [id number]  PCP:  Lajean Manes, MD  Cardiologist:  Glenetta Hew, MD  Electrophysiologist:  None   Referring MD: Lajean Manes, MD   C/C: Chest tightness  History of Present Illness:    Ian [name] is a 73 y.o. male with a hx of CAD, s/p PCI in 2004 (Dr Leonia Reeves).  He used to be the Radio producer at [employer].  He has  A history of prostate CA, GERD, and statin intolerance. He recently saw dr Ellyn Hack and complained of SSCP.  He describes mid sternal- right sided chest pressure and "tightness".  It radiates to his jaw and between his shoulder blades.  It's not exertional and is better when he lays down.  No associated nausea, vomiting, or SOB.  His symptoms last 4-5 minutes.  He does not take NTG- one dose of Imdur caused his B/P to drop into the 80's and gave him a headache x 2 days.  He says his symptoms are similar to his pre PCI symptoms.  Myoview done 11/05/2018 was low risk-EF 43% similar to past echo.   Past Medical History:  Diagnosis Date  . Arthritis   . Atherosclerosis of aorta ([place])   . CAD S/P percutaneous coronary angioplasty cardiologist-  dr Ellyn Hack   06-28-2002  Cardiac Cath subtotal occlusion of D1 -- staged PCI with Taxus DES 2.5 x 12; 08/2011: Cath for persistent CP despite normal Myoview -> 30% pLAD & pD1 with patent D1 stent.  EF ~45%.  . Diverticulosis   . GERD (gastroesophageal reflux disease)   . History of atrial fibrillation without current medication    one episode in 2011-- per cardiologist note has not had any other issues  . History of basal cell carcinoma (BCC) excision    x2  forehead  . History of kidney stones   . History of melanoma excision    scalp s/p moh's  . HTN (hypertension)   . Hypercholesterolemia   . Lung nodule    rt lower lobe  . Prostate cancer ([place])   . Renal cyst   . S/P drug eluting coronary stent placement 06/28/2002   x1  to D1  .  Varicose veins    wear teds  . Wears glasses   . Wears hearing aid in both ears     Past Surgical History:  Procedure Laterality Date  . BRACHIAL NERVE REPAIR Left 2007   approx.   RELEASE  . BUNIONECTOMY Right 2008   approx.   and Hammer toe correction  . CARPAL TUNNEL RELEASE Right 2018  . CERVICAL FUSION  ~2008;   07-21-2009   dr Carloyn Manner   posterior C2-3 2008 with screws;   cage fusion C2-3 07-21-2009  . CERVICAL GANGLIONECTOMY  1995   C2  posterior  . CORONARY ANGIOPLASTY  06-28-2002   dr Leonia Reeves  @[facility]    PCI and DES x1  to Diagonal 1  . INGUINAL HERNIA REPAIR Left 08/08/2016   Procedure: OPEN LEFT INGUINAL HERNIA REPAIR WITH MESH;  Surgeon: Armandina Gemma, MD;  Location: [facility];  Service: General;  Laterality: Left;  . INSERTION OF MESH Left 08/08/2016   Procedure: INSERTION OF MESH;  Surgeon: Armandina Gemma, MD;  Location: [facility];  Service: General;  Laterality: Left;  . LEFT HEART CATHETERIZATION WITH CORONARY ANGIOGRAM N/A 11/07/2011   Procedure: LEFT HEART CATHETERIZATION WITH CORONARY ANGIOGRAM;  Surgeon: Candee Furbish, MD;  Location: [facility];  Service: Cardiovascular: - Equivocal persistent symptoms despite nonischemic Myoview. EF ~45%. 30% calcified pLAD, 30% D1 prestent. Widely paent D1 DES stent.  . [place], 2010, 2011   Total 3 surgeries  . POSTERIOR LUMBAR FUSION  07-17 & 21-- 2019  dr Carloyn Manner  @ [facility] in [place]   staged  T10 -- S1  . REVERSE SHOULDER ARTHROPLASTY Right 11/14/2017  . REVERSE SHOULDER ARTHROPLASTY Right 11/13/2017   Procedure: REVERSE RIGHT SHOULDER ARTHROPLASTY;  Surgeon: Justice Britain, MD;  Location: [place];  Service: Orthopedics;  Laterality: Right;  . TRIGGER FINGER RELEASE Right 09-26-2005   dr sypher  @[facility]    right ring finger    Current Medications: Current Meds  Medication Sig  . aspirin EC 81 MG tablet Take 81 mg by mouth at bedtime.   . cetirizine (ZYRTEC) 10 MG tablet Take 10 mg by mouth at bedtime.   . Coenzyme Q10 (Q-10 CO-ENZYME  PO) coenzyme Q10  400 mg capsule   400 mg by oral route.  Marland Kitchen esomeprazole (NEXIUM) 20 MG capsule Take 20 mg by mouth every Monday, Wednesday, and Friday.   . gabapentin (NEURONTIN) 100 MG capsule Take 100 mg by mouth as needed.   . Glucosamine HCl-MSM (GLUCOSAMINE-MSM PO) Take 2 tablets by mouth daily.   Marland Kitchen HYDROcodone-acetaminophen (NORCO) 10-325 MG tablet Take 1 tablet by mouth every 4 (four) hours as needed.  Marland Kitchen lisinopril (ZESTRIL) 10 MG tablet Take 10 mg by mouth daily.  . magnesium hydroxide (MILK OF MAGNESIA) 400 MG/5ML suspension Take by mouth daily as needed for mild constipation.  . methocarbamol (ROBAXIN) 500 MG tablet Take 1 tablet (500 mg total) by mouth every 6 (six) hours as needed for muscle spasms.  . Misc Natural Products (TART CHERRY ADVANCED) CAPS Take 2 capsules by mouth daily.   . Multiple Vitamins-Minerals (OCUVITE ADULT 50+) CAPS Take 1 capsule by mouth daily.  . nitroGLYCERIN (NITROSTAT) 0.4 MG SL tablet Place 0.4 mg under the tongue every 5 (five) minutes as needed for chest pain.   . Riboflavin 100 MG TABS Take 400 mg by mouth daily. Vitamin B2  . sildenafil (VIAGRA) 50 MG tablet Take 50 mg by mouth daily as needed for erectile dysfunction.  . vitamin C (ASCORBIC ACID) 500 MG tablet Take 1,000 mg by mouth daily.   Marland Kitchen XIIDRA 5 % SOLN Place 1 drop into both eyes 2 (two) times daily as needed for dry eyes.     Allergies:   Isosorbide, Statins, Adhesive [tape], Codeine, Demerol [meperidine], Lescol [fluvastatin sodium], Morphine and related, Oxycodone, Penicillins, Pravachol [pravastatin sodium], Welchol [colesevelam hcl], and Zetia [ezetimibe]   Social History   Socioeconomic History  . Marital status: Married    Spouse name: Not on file  . Number of children: 2  . Years of education: 9  . Highest education level: Not on file  Occupational History  . Occupation: Orthoptist    Comment: Works for [employer] - plans to retire the end of June 2018.   Social Needs  . Financial resource strain: Not on file  . Food insecurity    Worry: Not on file    Inability: Not on file  . Transportation needs    Medical: Not on file    Non-medical: Not on file  Tobacco Use  . Smoking status: Never Smoker  . Smokeless tobacco: Never Used  Substance and Sexual Activity  . Alcohol use: No  . Drug use: No  .  Sexual activity: Yes  Lifestyle  . Physical activity    Days per week: Not on file    Minutes per session: Not on file  . Stress: Not on file  Relationships  . Social Herbalist on phone: Not on file    Gets together: Not on file    Attends religious service: Not on file    Active member of club or organization: Not on file    Attends meetings of clubs or organizations: Not on file    Relationship status: Not on file  Other Topics Concern  . Not on file  Social History Narrative   He is a married father of 2, grandfather of 39. He currently lives with his wife. 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 He is active, but does not do routine exercise. He does most yard work and is able to walk in the stores etc.     Family History: The patient's family history includes Cancer in his sister; Cancer - Prostate in his brother; Cervical cancer in his maternal grandmother; Coronary artery disease in his father; Dementia in his mother; Esophageal cancer in his father; Heart attack (age of onset: 30) in his maternal grandfather; Hypertension in his mother; Prostate cancer in his brother, father, and paternal uncle.  ROS:   Please see the history of present illness.     All other systems reviewed and are negative.  EKGs/Labs/Other Studies Reviewed:    The following studies were reviewed today: Myoview 11/05/2018  EKG:  EKG is ordered today.  The ekg ordered today demonstrates NSR, NSST changes in inferior leads.   Recent Labs: 05/11/2018: BUN 14; Creatinine, Ser 0.78; Hemoglobin 13.9; Platelets  162; Potassium 4.6; Sodium 142  Recent Lipid Panel No results found for: CHOL, TRIG, HDL, CHOLHDL, VLDL, LDLCALC, LDLDIRECT  Physical Exam:    VS:  BP (!) 160/78 (BP Location: Left Arm, Patient Position: Sitting, Cuff Size: Normal)   Ht 5' 10.5" (1.791 m)   Wt 184 lb (83.5 kg)   SpO2 98%   BMI 26.03 kg/m     Wt Readings from Last 3 Encounters:  11/11/18 184 lb (83.5 kg)  10/21/18 187 lb 3.2 oz (84.9 kg)  09/04/18 185 lb (83.9 kg)     GEN:  Well nourished, well developed in no acute distress HEENT: Normal NECK: No JVD; No carotid bruits LYMPHATICS: No lymphadenopathy CARDIAC: RRR, no murmurs, rubs, gallops RESPIRATORY:  Clear to auscultation without rales, wheezing or rhonchi  ABDOMEN: Soft, non-tender, non-distended MUSCULOSKELETAL:  No edema; No deformity  SKIN: Warm and dry NEUROLOGIC:  Alert and oriented x 3 PSYCHIATRIC:  Normal affect   ASSESSMENT:    Chest pain with moderate risk for cardiac etiology Will review with Dr Ellyn Hack- consider diagnostic cath  CAD S/P percutaneous coronary angioplasty Diagonal stent 2004.  Cath 11/07/11 - patent stent, minimal stenosis of prox LAD. Minor stenosis just prior to stent. No flow limiting disease.  Myoview low risk 11/05/2018  Essential hypertension 132/ 80 by me  History of atrial fibrillation without current medication NSR today- he is not on [facility]  Malignant neoplasm of prostate ([place]) Followed by Dr Tammi Klippel  PLAN:    Will discuss with Dr Ellyn Hack- ? cath  Addendum: Discussed with dr Ellyn Hack- plan OP cath next week. The patient understands that risks included but are not limited to stroke (1 in 1000), death (1 in 34), kidney failure [usually temporary] (1 in  500), bleeding (1 in 200), allergic reaction [possibly serious] (1 in 200).  The patient understands and agrees to proceed.   Kerin Ransom PA-C 11/11/2018 1:13 PM   Medication Adjustments/Labs and Tests Ordered: Current medicines are reviewed at length with the  patient today.  Concerns regarding medicines are outlined above.  No orders of the defined types were placed in this encounter.  No orders of the defined types were placed in this encounter.   There are no Patient Instructions on file for this visit.   Signed, Kerin Ransom, PA-C  11/11/2018 10:02 AM    [place]

## 2018-11-11 NOTE — Assessment & Plan Note (Signed)
Will review with Dr Ellyn Hack- consider diagnostic cath

## 2018-11-13 ENCOUNTER — Other Ambulatory Visit (HOSPITAL_COMMUNITY)
Admission: RE | Admit: 2018-11-13 | Discharge: 2018-11-13 | Disposition: A | Discharge status: Home / Self Care | Payer: Medicare Other | Source: Ambulatory Visit | Attending: Cardiology | Admitting: Cardiology

## 2018-11-13 DIAGNOSIS — I10 Essential (primary) hypertension: Secondary | ICD-10-CM | POA: Diagnosis not present

## 2018-11-13 DIAGNOSIS — I2510 Atherosclerotic heart disease of native coronary artery without angina pectoris: Secondary | ICD-10-CM | POA: Diagnosis not present

## 2018-11-13 DIAGNOSIS — C61 Malignant neoplasm of prostate: Secondary | ICD-10-CM | POA: Diagnosis not present

## 2018-11-13 DIAGNOSIS — R079 Chest pain, unspecified: Secondary | ICD-10-CM | POA: Diagnosis not present

## 2018-11-13 DIAGNOSIS — Z9861 Coronary angioplasty status: Secondary | ICD-10-CM | POA: Diagnosis not present

## 2018-11-13 DIAGNOSIS — Z20828 Contact with and (suspected) exposure to other viral communicable diseases: Secondary | ICD-10-CM | POA: Insufficient documentation

## 2018-11-13 DIAGNOSIS — Z01812 Encounter for preprocedural laboratory examination: Secondary | ICD-10-CM | POA: Diagnosis not present

## 2018-11-13 DIAGNOSIS — Z8679 Personal history of other diseases of the circulatory system: Secondary | ICD-10-CM | POA: Diagnosis not present

## 2018-11-14 LAB — CBC WITH DIFFERENTIAL/PLATELET
Basophils Absolute: 0.1 10*3/uL (ref 0.0–0.2)
Basos: 1 %
EOS (ABSOLUTE): 0.1 10*3/uL (ref 0.0–0.4)
Eos: 1 %
Hematocrit: 44.1 % (ref 37.5–51.0)
Hemoglobin: 14.3 g/dL (ref 13.0–17.7)
Immature Grans (Abs): 0 10*3/uL (ref 0.0–0.1)
Immature Granulocytes: 0 %
Lymphocytes Absolute: 2.7 10*3/uL (ref 0.7–3.1)
Lymphs: 40 %
MCH: 28.8 pg (ref 26.6–33.0)
MCHC: 32.4 g/dL (ref 31.5–35.7)
MCV: 89 fL (ref 79–97)
Monocytes Absolute: 0.6 10*3/uL (ref 0.1–0.9)
Monocytes: 9 %
Neutrophils Absolute: 3.3 10*3/uL (ref 1.4–7.0)
Neutrophils: 49 %
Platelets: 180 10*3/uL (ref 150–450)
RBC: 4.96 x10E6/uL (ref 4.14–5.80)
RDW: 12.5 % (ref 11.6–15.4)
WBC: 6.7 10*3/uL (ref 3.4–10.8)

## 2018-11-14 LAB — BASIC METABOLIC PANEL
BUN/Creatinine Ratio: 23 (ref 10–24)
BUN: 19 mg/dL (ref 8–27)
CO2: 25 mmol/L (ref 20–29)
Calcium: 9.2 mg/dL (ref 8.6–10.2)
Chloride: 106 mmol/L (ref 96–106)
Creatinine, Ser: 0.82 mg/dL (ref 0.76–1.27)
GFR calc Af Amer: 103 mL/min/{1.73_m2} (ref >59)
GFR calc non Af Amer: 89 mL/min/{1.73_m2} (ref >59)
Glucose: 96 mg/dL (ref 65–99)
Potassium: 4.3 mmol/L (ref 3.5–5.2)
Sodium: 146 mmol/L — ABNORMAL HIGH (ref 134–144)

## 2018-11-14 LAB — SARS CORONAVIRUS 2 (TAT 6-24 HRS): SARS Coronavirus 2: NEGATIVE

## 2018-11-16 ENCOUNTER — Telehealth: Payer: Self-pay | Admitting: *Deleted

## 2018-11-16 NOTE — Telephone Encounter (Signed)
Pt contacted pre-catheterization scheduled at [facility] for: Tuesday November 17, 2018 9 AM Verified arrival time and place: [facility]) at: 7 AM   No solid food after midnight prior to cath, clear liquids until 5 AM day of procedure. Contrast allergy: no  Hold: Viagra-until post procedure.  AM meds can be taken pre-cath with sip of water including: ASA 81 mg   Confirmed patient has responsible person to drive home post procedure and observe 24 hours after arriving home: yes  Due to Covid-19 pandemic, only one support person will be allowed with patient. Must be the same support person for that patient's entire stay, will be screened and required to wear a mask.   Patients are required to wear a mask when they enter the hospital.      COVID-19 Pre-Screening Questions:  . In the past 7 to 10 days have you had a cough,  shortness of breath, headache, congestion, fever (100 or greater) body aches, chills, sore throat, or sudden loss of taste or sense of smell? no . Have you been around anyone with known Covid 19? no . Have you been around anyone who is awaiting Covid 19 test results in the past 7 to 10 days? no . Have you been around anyone who has been exposed to Covid 19, or has mentioned symptoms of Covid 19 within the past 7 to 10 days? no   I reviewed procedure/mask/visitor instructions, Covid-19 screening questions with patient, he verbalized understanding, thanked me for call.

## 2018-11-17 ENCOUNTER — Other Ambulatory Visit: Payer: Self-pay

## 2018-11-17 ENCOUNTER — Encounter (HOSPITAL_COMMUNITY)
Admission: RE | Disposition: A | Discharge status: Home / Self Care | Payer: Medicare Other | Source: Home / Self Care | Attending: Cardiology

## 2018-11-17 ENCOUNTER — Telehealth: Payer: Self-pay | Admitting: Cardiology

## 2018-11-17 ENCOUNTER — Ambulatory Visit (HOSPITAL_COMMUNITY)
Admission: RE | Admit: 2018-11-17 | Discharge: 2018-11-17 | Disposition: A | Discharge status: Home / Self Care | Payer: Medicare Other | Attending: Cardiology | Admitting: Cardiology

## 2018-11-17 DIAGNOSIS — I25118 Atherosclerotic heart disease of native coronary artery with other forms of angina pectoris: Secondary | ICD-10-CM | POA: Diagnosis not present

## 2018-11-17 DIAGNOSIS — C61 Malignant neoplasm of prostate: Secondary | ICD-10-CM | POA: Diagnosis not present

## 2018-11-17 DIAGNOSIS — I4891 Unspecified atrial fibrillation: Secondary | ICD-10-CM | POA: Diagnosis not present

## 2018-11-17 DIAGNOSIS — Z888 Allergy status to other drugs, medicaments and biological substances status: Secondary | ICD-10-CM | POA: Diagnosis not present

## 2018-11-17 DIAGNOSIS — Z7982 Long term (current) use of aspirin: Secondary | ICD-10-CM | POA: Diagnosis not present

## 2018-11-17 DIAGNOSIS — M1990 Unspecified osteoarthritis, unspecified site: Secondary | ICD-10-CM | POA: Insufficient documentation

## 2018-11-17 DIAGNOSIS — I10 Essential (primary) hypertension: Secondary | ICD-10-CM | POA: Insufficient documentation

## 2018-11-17 DIAGNOSIS — Z8679 Personal history of other diseases of the circulatory system: Secondary | ICD-10-CM

## 2018-11-17 DIAGNOSIS — K219 Gastro-esophageal reflux disease without esophagitis: Secondary | ICD-10-CM | POA: Insufficient documentation

## 2018-11-17 DIAGNOSIS — E7800 Pure hypercholesterolemia, unspecified: Secondary | ICD-10-CM | POA: Insufficient documentation

## 2018-11-17 DIAGNOSIS — Z8249 Family history of ischemic heart disease and other diseases of the circulatory system: Secondary | ICD-10-CM | POA: Insufficient documentation

## 2018-11-17 DIAGNOSIS — Z880 Allergy status to penicillin: Secondary | ICD-10-CM | POA: Diagnosis not present

## 2018-11-17 DIAGNOSIS — I2584 Coronary atherosclerosis due to calcified coronary lesion: Secondary | ICD-10-CM | POA: Insufficient documentation

## 2018-11-17 DIAGNOSIS — Z01810 Encounter for preprocedural cardiovascular examination: Secondary | ICD-10-CM | POA: Diagnosis not present

## 2018-11-17 DIAGNOSIS — I208 Other forms of angina pectoris: Secondary | ICD-10-CM | POA: Diagnosis present

## 2018-11-17 DIAGNOSIS — Z955 Presence of coronary angioplasty implant and graft: Secondary | ICD-10-CM | POA: Insufficient documentation

## 2018-11-17 DIAGNOSIS — Z885 Allergy status to narcotic agent status: Secondary | ICD-10-CM | POA: Insufficient documentation

## 2018-11-17 DIAGNOSIS — I2510 Atherosclerotic heart disease of native coronary artery without angina pectoris: Secondary | ICD-10-CM

## 2018-11-17 DIAGNOSIS — Z79899 Other long term (current) drug therapy: Secondary | ICD-10-CM | POA: Insufficient documentation

## 2018-11-17 HISTORY — PX: INTRAVASCULAR PRESSURE WIRE/FFR STUDY: CATH118243

## 2018-11-17 HISTORY — PX: LEFT HEART CATH AND CORONARY ANGIOGRAPHY: CATH118249

## 2018-11-17 LAB — POCT ACTIVATED CLOTTING TIME: Activated Clotting Time: 241 seconds

## 2018-11-17 SURGERY — LEFT HEART CATH AND CORONARY ANGIOGRAPHY
Anesthesia: LOCAL

## 2018-11-17 MED ORDER — FENTANYL CITRATE (PF) 100 MCG/2ML IJ SOLN
INTRAMUSCULAR | Status: AC
  Filled 2018-11-17: qty 2

## 2018-11-17 MED ORDER — LIDOCAINE HCL (PF) 1 % IJ SOLN
INTRAMUSCULAR | Status: AC
  Filled 2018-11-17: qty 30

## 2018-11-17 MED ORDER — HEPARIN (PORCINE) IN NACL 1000-0.9 UT/500ML-% IV SOLN
INTRAVENOUS | Status: AC
  Filled 2018-11-17: qty 1000

## 2018-11-17 MED ORDER — MIDAZOLAM HCL 2 MG/2ML IJ SOLN
INTRAMUSCULAR | Status: AC
  Filled 2018-11-17: qty 2

## 2018-11-17 MED ORDER — SODIUM CHLORIDE 0.9% FLUSH: 3.0000 mL | INTRAVENOUS | Status: DC | PRN

## 2018-11-17 MED ORDER — NITROGLYCERIN 1 MG/10 ML FOR IR/CATH LAB
INTRA_ARTERIAL | Status: DC | PRN
  Administered 2018-11-17: 200 ug via INTRACORONARY

## 2018-11-17 MED ORDER — SODIUM CHLORIDE 0.9 % IV SOLN: 250.0000 mL | INTRAVENOUS | Status: DC | PRN

## 2018-11-17 MED ORDER — LIDOCAINE HCL (PF) 1 % IJ SOLN
INTRAMUSCULAR | Status: DC | PRN
  Administered 2018-11-17: 4 mL

## 2018-11-17 MED ORDER — HYDRALAZINE HCL 20 MG/ML IJ SOLN: 10.0000 mg | INTRAMUSCULAR | Status: DC | PRN

## 2018-11-17 MED ORDER — VERAPAMIL HCL 2.5 MG/ML IV SOLN
INTRAVENOUS | Status: DC | PRN
  Administered 2018-11-17: 10 mL via INTRA_ARTERIAL

## 2018-11-17 MED ORDER — SODIUM CHLORIDE 0.9 % IV SOLN: INTRAVENOUS | Status: DC

## 2018-11-17 MED ORDER — ASPIRIN 81 MG PO CHEW: 81.0000 mg | CHEWABLE_TABLET | ORAL | Status: DC

## 2018-11-17 MED ORDER — VERAPAMIL HCL 2.5 MG/ML IV SOLN
INTRAVENOUS | Status: AC
  Filled 2018-11-17: qty 2

## 2018-11-17 MED ORDER — ONDANSETRON HCL 4 MG/2ML IJ SOLN: 4.0000 mg | Freq: Four times a day (QID) | INTRAMUSCULAR | Status: DC | PRN

## 2018-11-17 MED ORDER — HEPARIN SODIUM (PORCINE) 1000 UNIT/ML IJ SOLN
INTRAMUSCULAR | Status: DC | PRN
  Administered 2018-11-17: 3000 [IU] via INTRAVENOUS
  Administered 2018-11-17 (×2): 4500 [IU] via INTRAVENOUS

## 2018-11-17 MED ORDER — NITROGLYCERIN 1 MG/10 ML FOR IR/CATH LAB
INTRA_ARTERIAL | Status: AC
  Filled 2018-11-17: qty 10

## 2018-11-17 MED ORDER — FENTANYL CITRATE (PF) 100 MCG/2ML IJ SOLN
INTRAMUSCULAR | Status: DC | PRN
  Administered 2018-11-17: 50 ug via INTRAVENOUS
  Administered 2018-11-17: 25 ug via INTRAVENOUS

## 2018-11-17 MED ORDER — LABETALOL HCL 5 MG/ML IV SOLN: 10.0000 mg | INTRAVENOUS | Status: DC | PRN

## 2018-11-17 MED ORDER — HEPARIN SODIUM (PORCINE) 1000 UNIT/ML IJ SOLN
INTRAMUSCULAR | Status: AC
  Filled 2018-11-17: qty 1

## 2018-11-17 MED ORDER — SODIUM CHLORIDE 0.9% FLUSH: 3.0000 mL | Freq: Two times a day (BID) | INTRAVENOUS | Status: DC

## 2018-11-17 MED ORDER — ACETAMINOPHEN 325 MG PO TABS: 650.0000 mg | ORAL_TABLET | ORAL | Status: DC | PRN

## 2018-11-17 MED ORDER — MIDAZOLAM HCL 2 MG/2ML IJ SOLN
INTRAMUSCULAR | Status: DC | PRN
  Administered 2018-11-17: 1 mg via INTRAVENOUS
  Administered 2018-11-17: 2 mg via INTRAVENOUS

## 2018-11-17 MED ORDER — AMLODIPINE BESYLATE 2.5 MG PO TABS: 2.5000 mg | ORAL_TABLET | Freq: Every day | ORAL | 3 refills | Status: DC

## 2018-11-17 MED ORDER — SODIUM CHLORIDE 0.9 % WEIGHT BASED INFUSION
3.0000 mL/kg/h | INTRAVENOUS | Status: AC
  Administered 2018-11-17: 3 mL/kg/h via INTRAVENOUS

## 2018-11-17 MED ORDER — RANOLAZINE ER 500 MG PO TB12: 500.0000 mg | ORAL_TABLET | Freq: Two times a day (BID) | ORAL | 3 refills | Status: DC

## 2018-11-17 MED ORDER — HEPARIN (PORCINE) IN NACL 1000-0.9 UT/500ML-% IV SOLN
INTRAVENOUS | Status: DC | PRN
  Administered 2018-11-17 (×2): 500 mL

## 2018-11-17 MED ORDER — IOHEXOL 350 MG/ML SOLN
INTRAVENOUS | Status: DC | PRN
  Administered 2018-11-17: 120 mL via INTRA_ARTERIAL

## 2018-11-17 MED ORDER — SODIUM CHLORIDE 0.9 % WEIGHT BASED INFUSION: 1.0000 mL/kg/h | INTRAVENOUS | Status: DC

## 2018-11-17 SURGICAL SUPPLY — 13 items
CATH OPTITORQUE TIG 4.0 5F (CATHETERS) ×1 IMPLANT
CATH VISTA GUIDE 6FR XB3.5 (CATHETERS) ×1 IMPLANT
DEVICE RAD COMP TR BAND LRG (VASCULAR PRODUCTS) ×1 IMPLANT
GLIDESHEATH SLEND SS 6F .021 (SHEATH) ×2 IMPLANT
GUIDEWIRE INQWIRE 1.5J.035X260 (WIRE) IMPLANT
GUIDEWIRE PRESSURE COMET II (WIRE) ×1 IMPLANT
INQWIRE 1.5J .035X260CM (WIRE) ×2
KIT ESSENTIALS PG (KITS) ×1 IMPLANT
KIT HEART LEFT (KITS) ×2 IMPLANT
PACK CARDIAC CATHETERIZATION (CUSTOM PROCEDURE TRAY) ×2 IMPLANT
SHEATH PROBE COVER 6X72 (BAG) ×1 IMPLANT
TRANSDUCER W/STOPCOCK (MISCELLANEOUS) ×2 IMPLANT
TUBING CIL FLEX 10 FLL-RA (TUBING) ×2 IMPLANT

## 2018-11-17 NOTE — Progress Notes (Signed)
[id number] Received order post cath. Since pt did not have stent or PTCA, cardiologist would have to write CRP 2 order for pt under stable angina diagnosis if pt interested after prostate surgery. Pt to discuss with cardiology. Gave brochure. Gave heart healthy diet. Did not review NTG use as pt has hypotension with it. Encouraged pt to consider stationary bike after wrist recovers and to begin slowly. Pt stated he has bike but has not been using and he has foot drop that makes safe walking hard. Graylon Good RN BSN 11/17/2018 1:49 PM

## 2018-11-17 NOTE — Telephone Encounter (Signed)
Spoke with patient who had heart cath today. Amlodipine 2.5mg  was added (in addition to Ranexa) - for anginal benefit it appears. Patient is concerned that he will start this medication and his BP will be too low - typically runs [id number] systolic and he is also on lisinopril 10mg . Notified patient will send message to MD to review and advise on medications and he will receive a call back.

## 2018-11-17 NOTE — Telephone Encounter (Signed)
Patient had cardiac cath today 11/17/18

## 2018-11-17 NOTE — Interval H&P Note (Signed)
History and Physical Interval Note:  11/17/2018 9:35 AM  Ian [name]  has presented today for surgery, with the diagnosis of Chest pain -atypical angina.  The various methods of treatment have been discussed with the patient and family. After consideration of risks, benefits and other options for treatment, the patient has consented to  Procedure(s): LEFT HEART CATH AND CORONARY ANGIOGRAPHY (N/A) as a surgical intervention.  The patient's history has been reviewed, patient examined, no change in status, stable for surgery.  I have reviewed the patient's chart and labs.  Questions were answered to the patient's satisfaction.    Cath Lab Visit (complete for each Cath Lab visit)  Clinical Evaluation Leading to the Procedure:   ACS: No.  Non-ACS:    Anginal Classification: CCS III  Anti-ischemic medical therapy: Minimal Therapy (1 class of medications) -  Non-Invasive Test Results: Equivocal test results -continued concerning symptoms despite nonischemic Myoview  Prior CABG: No previous CABG Stress test  Glenetta Hew

## 2018-11-17 NOTE — Discharge Instructions (Signed)
Radial Site Care ° °This sheet gives you information about how to care for yourself after your procedure. Your health care provider may also give you more specific instructions. If you have problems or questions, contact your health care provider. °What can I expect after the procedure? °After the procedure, it is common to have: °· Bruising and tenderness at the catheter insertion area. °Follow these instructions at home: °Medicines °· Take over-the-counter and prescription medicines only as told by your health care provider. °Insertion site care °· Follow instructions from your health care provider about how to take care of your insertion site. Make sure you: °? Wash your hands with soap and water before you change your bandage (dressing). If soap and water are not available, use hand sanitizer. °? Change your dressing as told by your health care provider. °? Leave stitches (sutures), skin glue, or adhesive strips in place. These skin closures may need to stay in place for 2 weeks or longer. If adhesive strip edges start to loosen and curl up, you may trim the loose edges. Do not remove adhesive strips completely unless your health care provider tells you to do that. °· Check your insertion site every day for signs of infection. Check for: °? Redness, swelling, or pain. °? Fluid or blood. °? Pus or a bad smell. °? Warmth. °· Do not take baths, swim, or use a hot tub until your health care provider approves. °· You may shower 24-48 hours after the procedure, or as directed by your health care provider. °? Remove the dressing and gently wash the site with plain soap and water. °? Pat the area dry with a clean towel. °? Do not rub the site. That could cause bleeding. °· Do not apply powder or lotion to the site. °Activity ° °· For 24 hours after the procedure, or as directed by your health care provider: °? Do not flex or bend the affected arm. °? Do not push or pull heavy objects with the affected arm. °? Do not  drive yourself home from the hospital or clinic. You may drive 24 hours after the procedure unless your health care provider tells you not to. °? Do not operate machinery or power tools. °· Do not lift anything that is heavier than 10 lb (4.5 kg), or the limit that you are told, until your health care provider says that it is safe. °· Ask your health care provider when it is okay to: °? Return to work or school. °? Resume usual physical activities or sports. °? Resume sexual activity. °General instructions °· If the catheter site starts to bleed, raise your arm and put firm pressure on the site. If the bleeding does not stop, get help right away. This is a medical emergency. °· If you went home on the same day as your procedure, a responsible adult should be with you for the first 24 hours after you arrive home. °· Keep all follow-up visits as told by your health care provider. This is important. °Contact a health care provider if: °· You have a fever. °· You have redness, swelling, or yellow drainage around your insertion site. °Get help right away if: °· You have unusual pain at the radial site. °· The catheter insertion area swells very fast. °· The insertion area is bleeding, and the bleeding does not stop when you hold steady pressure on the area. °· Your arm or hand becomes pale, cool, tingly, or numb. °These symptoms may represent a serious problem   that is an emergency. Do not wait to see if the symptoms will go away. Get medical help right away. Call your local emergency services (911 in the [place] Do not drive yourself to the hospital. °Summary °· After the procedure, it is common to have bruising and tenderness at the site. °· Follow instructions from your health care provider about how to take care of your radial site wound. Check the wound every day for signs of infection. °· Do not lift anything that is heavier than 10 lb (4.5 kg), or the limit that you are told, until your health care provider says  that it is safe. °This information is not intended to replace advice given to you by your health care provider. Make sure you discuss any questions you have with your health care provider. °Document Released: 05/04/2010 Document Revised: 05/07/2017 Document Reviewed: 05/07/2017 °Elsevier Patient Education © 2020 Else[employer] Inc. ° °

## 2018-11-17 NOTE — Telephone Encounter (Signed)
New Message   Patient is calling because he had a heart cath today. He is wanting to know should he stop the lisinopril and add the amlodipine.

## 2018-11-17 NOTE — Brief Op Note (Signed)
11/17/2018  10:53 AM  PATIENT:  Ian [name]  73 y.o. male with history of PCI to a diagonal branch in 2004.  He has been recently evaluated for new onset recurrence of chest and jaw pain that is not necessarily exertional.  He was evaluated with a Myoview stress test which showed an EF of 43% and no significant ischemia.  Despite this he continued to have symptoms and is therefore referred for cardiac catheterization.  PRE-OPERATIVE DIAGNOSIS: Atypical angina  POST-OPERATIVE DIAGNOSIS:    DFR positive distal LAD following extensive calcified 55% stenosis in the mid vessel  70% D1 after stent prior to bifurcation  80% ostial lateral OM 1  Normal RCA  Mildly reduced EF roughly 45%, global hypokinesis.  Normal EDP   PROCEDURE:  Procedure(s): LEFT HEART CATH AND CORONARY ANGIOGRAPHY (N/A) INTRAVASCULAR PRESSURE WIRE/FFR STUDY (N/A) -LAD  -6 French right radial artery (micropuncture kit using direct ultrasound guidance) -5 [place] take 4.0 catheter used for right and left coronary angiography and left ventriculography. -6 French XB 3.5 guide catheter for FFR of LAD  SURGEON:  Surgeon(s) and Role:    * Leonie Man, MD - Primary  PHYSICIAN ASSISTANT:   ANESTHESIA:   local and IV sedation; 3 mL lidocaine local, 3 mg IV Versed and 25 mcg IV fentanyl  EBL:  <20 mL  MEDICATIONS USED: 12,000 units total heparin, 120 mL contrast, 20 mg IC nitro COUNTS:  YES  TR band: 1050 hrs., 16 mL air  DICTATION: .Note written in EPIC  PLAN OF CARE: Discharge to home after PACU   Plan for now will be medical management as extensive LAD PCI or PCI/PTCA of the small vessel OM or diagonal vessels cannot be sure to relieve his symptoms.  Would recommend proceeding with prostate cancer on medical management, if symptoms persist beyond that, we could consider interventional options.  Will start low-dose amlodipine and Ranexa.  We will need to consider PCSK9 inhibitor based on a statin  intolerance.   PATIENT DISPOSITION:  PACU - hemodynamically stable.   Delay start of Pharmacological VTE agent (>24hrs) due to surgical blood loss or risk of bleeding: not applicable    Glenetta Hew, M.D., M.S. Interventional Cardiologist   Pager # [phone] Phone # [phone] [address] [place], [place] [address]

## 2018-11-18 ENCOUNTER — Encounter (HOSPITAL_COMMUNITY): Payer: Self-pay | Admitting: Cardiology

## 2018-11-19 ENCOUNTER — Other Ambulatory Visit: Payer: Self-pay | Admitting: Urology

## 2018-11-19 ENCOUNTER — Telehealth: Payer: Self-pay | Admitting: Cardiology

## 2018-11-19 MED ORDER — LISINOPRIL 10 MG PO TABS: 5.0000 mg | ORAL_TABLET | Freq: Every day | ORAL | 6 refills | Status: DC

## 2018-11-19 NOTE — Telephone Encounter (Signed)
With his CAD, I would prefer that he does not hold ASA for prostatectomy.  However, if it is not an option - would then say 5 days pre-op & restart POD #1.  Glenetta Hew, MD

## 2018-11-19 NOTE — Telephone Encounter (Signed)
New Message         [facility] Pre-operative Risk Assessment    Request for surgical clearance:  1. What type of surgery is being performed? Robotic Laparoscopic Prostatectomy   2. When is this surgery scheduled? 01/13/19   3. What type of clearance is required (medical clearance vs. Pharmacy clearance to hold med vs. Both)? Both   4. Are there any medications that need to be held prior to surgery and how long? Aspirin 5 days before   5. Practice name and name of physician performing surgery? [facility] Dr Link Snuffer   6. What is your office phone number [phone]   7.   What is your office fax number [phone]   8.   Anesthesia type (None, local, MAC, general) ? General    Ian [name] 11/19/2018, 3:16 PM  _________________________________________________________________   (provider comments below)

## 2018-11-19 NOTE — Telephone Encounter (Signed)
PATIENT AWARE . CHANGED MEDICATION LIST TO REFLECT  DECREASE

## 2018-11-19 NOTE — Telephone Encounter (Signed)
Have him take 1/2 tab of Lisinopril.  Glenetta Hew, MD

## 2018-11-23 NOTE — Telephone Encounter (Signed)
Left another VM. Will reach out again in the AM.

## 2018-11-23 NOTE — Telephone Encounter (Addendum)
Left a voiemail

## 2018-11-23 NOTE — Telephone Encounter (Signed)
Patient returned call. He can be reached [phone].

## 2018-11-24 NOTE — Telephone Encounter (Signed)
   Primary Cardiologist: Glenetta Hew, MD  Chart reviewed as part of pre-operative protocol coverage. Patient was contacted 11/24/2018 in reference to pre-operative risk assessment for pending surgery as outlined below.  Ian [name] was last seen on 11/17/18 by Dr. Ellyn Hack.  Patient underwent left heart catheterization 11/17/18 with obstructive disease. Dr. Ellyn Hack opted to forgo intervention given his upcoming prostate surgery. Prostate surgery is on 01/13/19. After recovery from his surgery, Dr. Ellyn Hack will re-evaluate for intervention on remaining disease. Patient is maintained on ASA.   Per Dr. Ellyn Hack:  With his CAD, I would prefer that he does not hold ASA for prostatectomy.  However, if it is not an option - would then say 5 days pre-op & restart POD #1.  [name] understands that given his CAD, he is at increased risk for prostate surgery, but is willing to proceed.    I will route this recommendation to the requesting party via Epic fax function and remove from pre-op pool.  Please call with questions.  [place], PA 11/24/2018, 9:04 AM

## 2018-11-24 NOTE — Telephone Encounter (Signed)
I actually am not going to be in the office on 09/28 -- changes in the schedule.  Will need to try to get him added on to clinic the preceding week.  Glenetta Hew, MD

## 2018-11-24 NOTE — Telephone Encounter (Signed)
Pt's surgery is scheduled for 01/13/19. His next appt with Dr. Ellyn Hack is scheduled for 01/11/19. I discussed with him that he would be under quarantine after his COVID test for surgery at that time. He will either need to change to a virtual visit on 9/28 or move his appt until after his prostate surgery.   I will route to Dr. Ellyn Hack for further advisement.

## 2018-11-25 NOTE — Telephone Encounter (Signed)
Contacted patient and informed him that Dr Ellyn Hack scheduled changed and Dr Ellyn Hack would like for patient to be seen the week prior. Patient voiced understanding and requested to see [facility]. Appoint,ent scheduled with Lurena Joiner on 01/04/2019; appt with dr Ellyn Hack on 01/11/2019 canceled. Patient voiced understanding.

## 2018-12-02 ENCOUNTER — Telehealth: Payer: Self-pay | Admitting: Cardiology

## 2018-12-02 ENCOUNTER — Ambulatory Visit
Admission: RE | Admit: 2018-12-02 | Discharge: 2018-12-02 | Disposition: A | Discharge status: Home / Self Care | Payer: Medicare Other | Source: Ambulatory Visit | Attending: Urology | Admitting: Urology

## 2018-12-02 DIAGNOSIS — C61 Malignant neoplasm of prostate: Secondary | ICD-10-CM | POA: Diagnosis not present

## 2018-12-02 MED ORDER — GADOBENATE DIMEGLUMINE 529 MG/ML IV SOLN
17.0000 mL | Freq: Once | INTRAVENOUS | Status: AC | PRN
  Administered 2018-12-02: 17 mL via INTRAVENOUS

## 2018-12-02 NOTE — Telephone Encounter (Signed)
Returned the call to the patient. He had his MRI of the prostate today. He stated that there was no evidence of malignancy. He would like to know, cardiac wise, what the next step would be for him.

## 2018-12-02 NOTE — Telephone Encounter (Signed)
°  Patient would like to discuss with Dr. Ellyn Hack the plan of action for his Cardiac Cath. He states he had an MRI done today for his Prostate cancer, and the cancer has not spread past his prostate. He can delay prostate treatment for now, and would like to start the necessary cardiac treatment as soon as possible.

## 2018-12-04 NOTE — Telephone Encounter (Signed)
SPOKE WITH PATIENT . HE WOULD LIKE For  Dr [place] DISCUSS WHAT WOULD BE  THE NEXT STEP  PER PATIENT - TREATMENT CAN WAIT ON PROSTATE.   PATIENT IS AWARE DR HARDING IS OUT OF OFFICE.

## 2018-12-04 NOTE — Telephone Encounter (Signed)
Follow up    Patient requesting a call today to discuss plan of care

## 2018-12-09 ENCOUNTER — Other Ambulatory Visit: Payer: Self-pay | Admitting: *Deleted

## 2018-12-09 NOTE — Telephone Encounter (Signed)
Talk to the patient today.  Still having some symptoms.  He has stopped his lisinopril and continuing amlodipine which has controlled the exertional symptoms, but he still having some chest pain symptoms.  He tells me that he had his MRI done and it looks like the prostate situation is relatively stable and could be delayed by another year.  As such, we have decided to proceed with PCI of the LAD and potentially PTCA of the diagonal branch and OM branch.  We will have been posted for cath on September 3.  He is seeing me on August 31 and then we will get his COVID test done following that visit.  He will get his preop labs done at our clinic.  Glenetta Hew, MD

## 2018-12-10 DIAGNOSIS — M6281 Muscle weakness (generalized): Secondary | ICD-10-CM | POA: Diagnosis not present

## 2018-12-10 DIAGNOSIS — M62838 Other muscle spasm: Secondary | ICD-10-CM | POA: Diagnosis not present

## 2018-12-10 DIAGNOSIS — M6200 Separation of muscle (nontraumatic), unspecified site: Secondary | ICD-10-CM | POA: Diagnosis not present

## 2018-12-10 DIAGNOSIS — L729 Follicular cyst of the skin and subcutaneous tissue, unspecified: Secondary | ICD-10-CM | POA: Diagnosis not present

## 2018-12-10 DIAGNOSIS — R300 Dysuria: Secondary | ICD-10-CM | POA: Diagnosis not present

## 2018-12-10 DIAGNOSIS — I10 Essential (primary) hypertension: Secondary | ICD-10-CM | POA: Diagnosis not present

## 2018-12-10 DIAGNOSIS — C61 Malignant neoplasm of prostate: Secondary | ICD-10-CM | POA: Diagnosis not present

## 2018-12-10 DIAGNOSIS — N393 Stress incontinence (female) (male): Secondary | ICD-10-CM | POA: Diagnosis not present

## 2018-12-14 ENCOUNTER — Ambulatory Visit (INDEPENDENT_AMBULATORY_CARE_PROVIDER_SITE_OTHER): Payer: Medicare Other | Admitting: Cardiology

## 2018-12-14 ENCOUNTER — Other Ambulatory Visit (HOSPITAL_COMMUNITY)
Admission: RE | Admit: 2018-12-14 | Discharge: 2018-12-14 | Disposition: A | Discharge status: Home / Self Care | Payer: Medicare Other | Source: Ambulatory Visit | Attending: Cardiology | Admitting: Cardiology

## 2018-12-14 ENCOUNTER — Encounter: Payer: Self-pay | Admitting: Cardiology

## 2018-12-14 ENCOUNTER — Other Ambulatory Visit: Payer: Self-pay

## 2018-12-14 VITALS — BP 136/80 | HR 64 | Temp 97.3°F | Ht 71.0 in | Wt 188.0 lb

## 2018-12-14 DIAGNOSIS — I10 Essential (primary) hypertension: Secondary | ICD-10-CM

## 2018-12-14 DIAGNOSIS — I25119 Atherosclerotic heart disease of native coronary artery with unspecified angina pectoris: Secondary | ICD-10-CM | POA: Diagnosis not present

## 2018-12-14 DIAGNOSIS — Z20828 Contact with and (suspected) exposure to other viral communicable diseases: Secondary | ICD-10-CM | POA: Insufficient documentation

## 2018-12-14 DIAGNOSIS — Z01812 Encounter for preprocedural laboratory examination: Secondary | ICD-10-CM | POA: Insufficient documentation

## 2018-12-14 DIAGNOSIS — I208 Other forms of angina pectoris: Secondary | ICD-10-CM | POA: Diagnosis not present

## 2018-12-14 MED ORDER — PANTOPRAZOLE SODIUM 40 MG PO TBEC: 40.0000 mg | DELAYED_RELEASE_TABLET | Freq: Every day | ORAL | 11 refills | Status: DC

## 2018-12-14 MED ORDER — CLOPIDOGREL BISULFATE 75 MG PO TABS: 75.0000 mg | ORAL_TABLET | Freq: Every day | ORAL | 3 refills | Status: AC

## 2018-12-14 NOTE — H&P (View-Only) (Signed)
PCP: Ian Manes, MD  Clinic Note: Chief Complaint  Patient presents with  . Follow-up    Discuss cath options.  . Coronary Artery Disease    Noted on cath, failed medical management  . Chest Pain    Atypical angina    HPI: Ian [name] is a 73 y.o. male with a PMH of CAD having PCI with recurrent angina below who presents today for post-cath follow-up and to discuss potential plans for PCI.Marland Kitchen  Ian [name] was last seen by Ian [name] on July 29 to discuss continued chest discomfort episodes despite a nonischemic Myoview  Recent Hospitalizations: For heart catheterization August 2020  Studies Personally Reviewed - (if available, images/films reviewed: From Epic Chart or Care Everywhere)  Cardiac Cath-DFR November 17, 2018: EF 40 to 45% global HK.  Mid-distal LAD extensive 50 to 60% stenosis, calcified.  DFR positive distally (initial plans for medical management).  D1 stent patent with 70% stenosis post stent, ostial lateral OM1 80% (smaller to OM branches) --> initial plans were for medical management to allow for prostate surgery  I had telephone conversation with Ian [name] last week, and he indicated that although symptoms were somewhat improved with the addition of Ranexa and and using amlodipine as opposed to lisinopril, he still is having chest discomfort spells that are mostly at rest, or after exertion.  He also informed me that he had the MRI done to evaluate his prostate and they are thinking they can delay the need for surgery for at least 1 more year.  As such, he would like to go ahead and proceed with potential PCI.  Interval History: Ian [name] returns today noting that he has had some benefit with the Ranexa and amlodipine, however he still is having episodes of discomfort in his chest that may be associated with doing yard work but is usually associated at rest.  It usually occurs when he is sedentary often lying down to go to bed or relaxing after activity.  Episodes are  relatively short-lived lasting a 5 to 10 minutes and go away with rest and deep breathing. He has noted a little bit more exertional dyspnea as well.  No PND, orthopnea or with minimal edema.  No palpitations, lightheadedness, dizziness, weakness or syncope/near syncope. No TIA/amaurosis fugax symptoms. No melena, hematochezia, hematuria, or epstaxis. No claudication.  ROS: A comprehensive was performed. Review of Systems  Constitutional: Positive for malaise/fatigue (Exercise intolerance). Negative for weight loss.  HENT: Negative for congestion and nosebleeds.   Respiratory: Negative for cough, shortness of breath and wheezing.   Gastrointestinal: Negative for blood in stool, heartburn, melena and nausea.  Genitourinary: Negative for dysuria and hematuria.  Musculoskeletal: Negative for falls, joint pain and myalgias.  Neurological: Negative for focal weakness.  Endo/Heme/Allergies: Negative for environmental allergies.  Psychiatric/Behavioral: Negative for depression and memory loss. The patient is not nervous/anxious and does not have insomnia.   All other systems reviewed and are negative. -Chronic foot drop on the right side  The patient does not have symptoms concerning for COVID-19 infection (fever, chills, cough, or new shortness of breath).  The patient is practicing social distancing.   COVID-19 Education: The signs and symptoms of COVID-19 were discussed with the patient and how to seek care for testing (follow up with PCP or arrange E-visit).   The importance of social distancing was discussed today.   I have reviewed and (if needed) personally updated the patient's problem list, medications, allergies, past medical and surgical history,  social and family history.   Past Medical History:  Diagnosis Date  . Arthritis   . Atherosclerosis of aorta ([place])   . CAD S/P percutaneous coronary angioplasty 2004   06-28-2002  Cardiac Cath subtotal TO of D1 -- staged PCI with  Taxus DES 2.5 x 12; Cath (abn Nuc ST) 08/2011: 30% pLAD & pD1 with patent D1 stent.  EF ~45%.; Cath 11/17/2018: long ~50-60% m-dLAD, 70% pre & post stent in D1, Ostial lat OM1 80% -- failed Med Rx  . Diverticulosis   . GERD (gastroesophageal reflux disease)   . History of atrial fibrillation without current medication    one episode in 2011-- per cardiologist note has not had any other issues  . History of basal cell carcinoma (BCC) excision    x2  forehead  . History of kidney stones   . History of melanoma excision    scalp s/p moh's  . HTN (hypertension)   . Hypercholesterolemia   . Lung nodule    rt lower lobe  . Prostate cancer ([place])   . Renal cyst   . S/P drug eluting coronary stent placement 06/28/2002   x1  to D1  . Varicose veins    wear teds  . Wears glasses   . Wears hearing aid in both ears     Past Surgical History:  Procedure Laterality Date  . BRACHIAL NERVE REPAIR Left 2007   approx.   RELEASE  . BUNIONECTOMY Right 2008   approx.   and Hammer toe correction  . CARPAL TUNNEL RELEASE Right 2018  . CERVICAL FUSION  ~2008;   07-21-2009   dr Carloyn Manner   posterior C2-3 2008 with screws;   cage fusion C2-3 07-21-2009  . CERVICAL GANGLIONECTOMY  1995   C2  posterior  . CORONARY ANGIOPLASTY  06-28-2002   dr Leonia Reeves  @[facility]    PCI and DES x1  to Diagonal 1  . INGUINAL HERNIA REPAIR Left 08/08/2016   Procedure: OPEN LEFT INGUINAL HERNIA REPAIR WITH MESH;  Surgeon: Armandina Gemma, MD;  Location: [facility];  Service: General;  Laterality: Left;  . INSERTION OF MESH Left 08/08/2016   Procedure: INSERTION OF MESH;  Surgeon: Armandina Gemma, MD;  Location: [facility];  Service: General;  Laterality: Left;  . INTRAVASCULAR PRESSURE WIRE/FFR STUDY N/A 11/17/2018   Procedure: INTRAVASCULAR PRESSURE WIRE/FFR STUDY;  Surgeon: Leonie Man, MD;  Location: [place] [facility] CV LAB;  Service: Cardiovascular;  Laterality: N/A;  . LEFT HEART CATH AND CORONARY ANGIOGRAPHY N/A 11/17/2018   Procedure: LEFT HEART CATH  AND CORONARY ANGIOGRAPHY;  Surgeon: Leonie Man, MD;  Location: [facility] INVASIVE CV LAB;;; EF 40 to 45% global HK.  Mid-distal LAD extensive 50 to 60% stenosis, calcified.  DFR positive distally (initial plans for medical management).  D1 stent patent with 70% stenosis post stent, ostial lateral OM1 80% (smaller to OM branches) --> initial plans =Med Rx  . LEFT HEART CATHETERIZATION WITH CORONARY ANGIOGRAM N/A 11/07/2011   Procedure: LEFT HEART CATHETERIZATION WITH CORONARY ANGIOGRAM;  Surgeon: Candee Furbish, MD;  Location: [facility] CATH LAB;  Service: Cardiovascular: - Equivocal persistent symptoms despite nonischemic Myoview. EF ~45%. 30% calcified pLAD, 30% D1 prestent. Widely paent D1 DES stent.  . [place], 2010, 2011   Total 3 surgeries  . POSTERIOR LUMBAR FUSION  07-17 & 21-- 2019  dr Carloyn Manner  @ [facility] in [place]   staged  T10 -- S1  . REVERSE SHOULDER ARTHROPLASTY Right 11/14/2017  . REVERSE  SHOULDER ARTHROPLASTY Right 11/13/2017   Procedure: REVERSE RIGHT SHOULDER ARTHROPLASTY;  Surgeon: Justice Britain, MD;  Location: [facility];  Service: Orthopedics;  Laterality: Right;  . TRIGGER FINGER RELEASE Right 09-26-2005   dr sypher  @[facility]    right ring finger    Current Meds  Medication Sig  . amLODipine (NORVASC) 2.5 MG tablet Take 1 tablet (2.5 mg total) by mouth daily.  Marland Kitchen aspirin EC 81 MG tablet Take 81 mg by mouth at bedtime.   . Calcium Carb-Cholecalciferol (CALCIUM 600+D) 600-800 MG-UNIT TABS Take 1 tablet by mouth 2 (two) times daily.  . cetirizine (ZYRTEC) 10 MG tablet Take 10 mg by mouth at bedtime.   . Coenzyme Q10 (COQ-10) 400 MG CAPS Take 400 mg by mouth daily.  Marland Kitchen gabapentin (NEURONTIN) 100 MG capsule Take 300 mg by mouth 3 (three) times daily as needed (severe pain.).   Marland Kitchen Glucosamine HCl-MSM (GLUCOSAMINE-MSM PO) Take 2 tablets by mouth daily.   Marland Kitchen HYDROcodone-acetaminophen (NORCO/VICODIN) 5-325 MG tablet Take 1 tablet by mouth every 6 (six) hours as needed (pain.).  Marland Kitchen magnesium  hydroxide (MILK OF MAGNESIA) 400 MG/5ML suspension Take 15-30 mLs by mouth daily as needed for mild constipation.   . methocarbamol (ROBAXIN) 500 MG tablet Take 1 tablet (500 mg total) by mouth every 6 (six) hours as needed for muscle spasms.  . Misc Natural Products (TART CHERRY ADVANCED) CAPS Take 2 capsules by mouth daily.   . Multiple Vitamins-Minerals (OCUVITE ADULT 50+) CAPS Take 1 capsule by mouth daily.  . nitroGLYCERIN (NITROSTAT) 0.4 MG SL tablet Place 1 tablet (0.4 mg total) under the tongue every 5 (five) minutes as needed for chest pain.  . ranolazine (RANEXA) 500 MG 12 hr tablet Take 1 tablet (500 mg total) by mouth 2 (two) times daily.  . riboflavin (VITAMIN B-2) 100 MG TABS tablet Take 400 mg by mouth daily.  . sildenafil (VIAGRA) 50 MG tablet Take 50 mg by mouth daily as needed for erectile dysfunction.  . vitamin C (ASCORBIC ACID) 500 MG tablet Take 1,000 mg by mouth daily.   Marland Kitchen XIIDRA 5 % SOLN Place 1 drop into both eyes 2 (two) times daily as needed for dry eyes.  . [DISCONTINUED] esomeprazole (NEXIUM) 20 MG capsule Take 20 mg by mouth 2 (two) times a day.     Allergies  Allergen Reactions  . Isosorbide Other (See Comments)     Dropped blood pressure , Develop severe headache   . Statins   . Adhesive [Tape] Rash and Other (See Comments)    Band aid adhesive ---- RASH Skin peels off  . Codeine Nausea And Vomiting  . Demerol [Meperidine] Nausea And Vomiting  . Lescol [Fluvastatin Sodium] Other (See Comments)    Muscle pain   . Morphine And Related Nausea And Vomiting  . Oxycodone Nausea And Vomiting  . Penicillins Nausea And Vomiting and Other (See Comments)    Has patient had a PCN reaction causing immediate rash, facial/tongue/throat swelling, SOB or lightheadedness with hypotension: No Has patient had a PCN reaction causing severe rash involving mucus membranes or skin necrosis: No Has patient had a PCN reaction that required hospitalization No Has patient had a  PCN reaction occurring within the last 10 years: No If all of the above answers are "NO", then may proceed with Cephalosporin use.   Christy Sartorius Sodium] Other (See Comments)    Muscle pain   . Welchol [Colesevelam Hcl] Other (See Comments)    Weakness   .  Zetia [Ezetimibe] Other (See Comments)    Muscle pain     Social History   Tobacco Use  . Smoking status: Never Smoker  . Smokeless tobacco: Never Used  Substance Use Topics  . Alcohol use: No  . Drug use: No   Social History   Social History Narrative   He is a married father of 2, grandfather of 62. He currently lives with his wife. 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 He is active, but does not do routine exercise. He does most yard work and is able to walk in the stores etc.    family history includes Cancer in his sister; Cancer - Prostate in his brother; Cervical cancer in his maternal grandmother; Coronary artery disease in his father; Dementia in his mother; Esophageal cancer in his father; Heart attack (age of onset: 40) in his maternal grandfather; Hypertension in his mother; Prostate cancer in his brother, father, and paternal uncle.  Wt Readings from Last 3 Encounters:  12/14/18 188 lb (85.3 kg)  11/17/18 185 lb (83.9 kg)  11/11/18 184 lb (83.5 kg)    PHYSICAL EXAM BP 136/80 (BP Location: Left Arm, Patient Position: Sitting, Cuff Size: Normal)   Pulse 64   Temp (!) 97.3 F (36.3 C)   Ht 5\' 11"  (1.803 m)   Wt 188 lb (85.3 kg)   BMI 26.22 kg/m  Physical Exam  Constitutional: He is oriented to person, place, and time. He appears well-developed and well-nourished. No distress.  Healthy-appearing.  Well-groomed  HENT:  Head: Normocephalic and atraumatic.  Eyes: Pupils are equal, round, and reactive to light. EOM are normal.  Neck: Normal range of motion. Neck supple. No hepatojugular reflux and no JVD present. Carotid bruit is not present.   Cardiovascular: Normal rate, regular rhythm, normal heart sounds and intact distal pulses.  No extrasystoles are present. PMI is not displaced. Exam reveals no gallop and no friction rub.  No murmur heard. Pulmonary/Chest: Breath sounds normal. No respiratory distress. He has no wheezes. He has no rales.  Abdominal: Bowel sounds are normal. He exhibits no distension. There is no abdominal tenderness. There is no rebound.  Musculoskeletal:        General: Edema (Left greater than right with plump swollen varicose veins) present.  Neurological: He is alert and oriented to person, place, and time. No cranial nerve deficit.  Skin: Skin is warm and dry.  No venous stasis changes with mild spider veins  Psychiatric: He has a normal mood and affect. His behavior is normal. Judgment and thought content normal.  Vitals reviewed.    Adult ECG Report Not checked  Other studies Reviewed: Additional studies/ records that were reviewed today include:  Recent Labs:   Lab Results  Component Value Date   CREATININE 0.82 11/13/2018   BUN 19 11/13/2018   NA 146 (H) 11/13/2018   K 4.3 11/13/2018   CL 106 11/13/2018   CO2 25 11/13/2018   No results found for: CHOL, HDL, LDLCALC, LDLDIRECT, TRIG, CHOLHDL   ASSESSMENT / PLAN: Problem List Items Addressed This Visit    Essential hypertension (Chronic)    Blood pressure stable on amlodipine, did not tolerate higher dose.  We substituted the amlodipine for lisinopril.  Has been pretty much resting bradycardia which would preclude [place] from using beta-blocker for now.      Coronary artery disease involving native coronary artery of native heart with angina pectoris ([place]) - Primary (Chronic)  He has at least 3 different areas that could be causing angina.  Clearly the ostial lateral branch of the OM has a tight lesion, and there is DFR positive lesion in the LAD that would be relatively long lesion somewhat calcified but treatable.  The diagonal also has  disease on either side of the stent but would probably only be a candidate for PTCA and not PCI.  We have tried Ranexa and amlodipine to the best he can tolerate and is still having symptoms.  Since he is hoping that he can delay his prostate surgery for least 6 to 12 months, I think we will now proceed with PCI at least of the LAD and [place] PTCA of the OM branch.  Likely just PTCA of the diagonal.  We will go ahead and load with Plavix 300 mg starting today and then tonight started him milligrams daily afterwards. Will probably need more aggressive lipid management, may need to consider PCSK9 inhibitor versus bempedoic acid      Relevant Orders   CBC   Basic metabolic panel   Atypical angina ([place])    His symptoms are not classic for angina, however she is now starting to have exertional symptoms as opposed to just resting symptoms. Plan: Reschedule for PCI      Relevant Orders   CBC   Basic metabolic panel      I spent a total of 25 minutes with the patient and chart review. >  50% of the time was spent in direct patient consultation.   Current medicines are reviewed at length with the patient today.  (+/- concerns) none The following changes have been made:  None  Patient Instructions  Medication Instructions:  - CHANGE TO PROTONIX 40  MG DAILY - PLAVIX 75  DAILY - STARTING TONIGHT TAKE 4 TABLET TONIGHT , THEN TOMORROW  TAKE ONE TABLET DAILY   If you need a refill on your cardiac medications before your next appointment, please call your pharmacy.   Lab work: CBC BMP COVID TEST- TODAY  Testing/Procedures:  SCHEDULE FOR SEPT 3, 2020 AT [place] Your physician has requested that you have a cardiac catheterization. Cardiac catheterization is used to diagnose and/or treat various heart conditions. Doctors may recommend this procedure for a number of different reasons. The most common reason is to evaluate chest pain. Chest pain can be a  symptom of coronary artery disease (CAD), and cardiac catheterization can show whether plaque is narrowing or blocking your heart's arteries. This procedure is also used to evaluate the valves, as well as measure the blood flow and oxygen levels in different parts of your heart. For further information please visit [email or web address][facility][email or web address]. Please follow instruction sheet, as given.   Follow-Up: At [facility], you and your health needs are our priority.  As part of our continuing mission to provide you with exceptional heart care, we have created designated Provider Care Teams.  These Care Teams include your primary Cardiologist (physician) and Advanced Practice Providers (APPs -  Physician Assistants and Nurse Practitioners) who all work together to provide you with the care you need, when you need it. You will need keep follow up appointment   With Ian Ransom PA    Any Other Special Instructions Will Be Listed Below (If Applicable).         Ian [name] [place] [address] Dept: [phone] Loc: [place]  KARMEL REHFELDT  12/14/2018  You are scheduled for a Cardiac Catheterization on Thursday, September 3 with Dr. Glenetta [name].  1. Please arrive at the [facility] (Main Entrance A) at [facility]: [address] [place], [place] [address] at 5:30 AM (This time is two hours before your procedure to ensure your preparation). Free valet parking service is available.   Special note: Every effort is made to have your procedure done on time. Please understand that emergencies sometimes delay scheduled procedures.  2. Diet: Do not eat solid foods after midnight.  The patient may have clear liquids until 5am upon the day of the procedure.  3. Labs: You will need to have blood drawn  Today - BMP , CBC COVID TEST TODAY - [address] AT 2:25 PM- SELF ISOLATE UNTIL  CATHETERIZATION SEPT 3 ,2020 .  4. Medication instructions in preparation for your procedure:  -- PLAVIX 75 MG  THIS MORNING  On the morning of your procedure, take your Aspirin 81 MG  and any morning medicines NOT listed above.  You may use sips of water.  5. Plan for one night stay--bring personal belongings. 6. Bring a current list of your medications and current insurance cards. 7. You MUST have a responsible person to drive you home. 8. Someone MUST be with you the first 24 hours after you arrive home or your discharge will be delayed. 9. Please wear clothes that are easy to get on and off and wear slip-on shoes.  Thank you for allowing [place] to care for you!   -- [facility]        Studies Ordered:   Orders Placed This Encounter  Procedures  . CBC  . Basic metabolic panel      Ian [name], M.D., M.S. Interventional Cardiologist   Pager # [phone] Phone # [phone] [address] [place], [place] [address]   Thank you for choosing Heartcare at [facility]!!

## 2018-12-14 NOTE — Patient Instructions (Addendum)
Medication Instructions:  - CHANGE TO PROTONIX 40  MG DAILY - PLAVIX 75  DAILY - STARTING TONIGHT TAKE 4 TABLET TONIGHT , THEN TOMORROW  TAKE ONE TABLET DAILY   If you need a refill on your cardiac medications before your next appointment, please call your pharmacy.   Lab work: CBC BMP COVID TEST- TODAY  Testing/Procedures:  SCHEDULE FOR SEPT 3, 2020 AT [place] Your physician has requested that you have a cardiac catheterization. Cardiac catheterization is used to diagnose and/or treat various heart conditions. Doctors may recommend this procedure for a number of different reasons. The most common reason is to evaluate chest pain. Chest pain can be a symptom of coronary artery disease (CAD), and cardiac catheterization can show whether plaque is narrowing or blocking your heart's arteries. This procedure is also used to evaluate the valves, as well as measure the blood flow and oxygen levels in different parts of your heart. For further information please visit HugeFiesta.tn. Please follow instruction sheet, as given.   Follow-Up: At [facility], you and your health needs are our priority.  As part of our continuing mission to provide you with exceptional heart care, we have created designated Provider Care Teams.  These Care Teams include your primary Cardiologist (physician) and Advanced Practice Providers (APPs -  Physician Assistants and Nurse Practitioners) who all work together to provide you with the care you need, when you need it. You will need keep follow up appointment   With Kerin Ransom PA    Any Other Special Instructions Will Be Listed Below (If Applicable).         [place] [address] Dept: [phone] Loc: [place]  12/14/2018  You are scheduled for a Cardiac Catheterization on Thursday,  September 3 with Dr. Glenetta Hew.  1. Please arrive at the [facility] (Main Entrance A) at [facility]: [address] [place],  [address] at 5:30 AM (This time is two hours before your procedure to ensure your preparation). Free valet parking service is available.   Special note: Every effort is made to have your procedure done on time. Please understand that emergencies sometimes delay scheduled procedures.  2. Diet: Do not eat solid foods after midnight.  The patient may have clear liquids until 5am upon the day of the procedure.  3. Labs: You will need to have blood drawn  Today - BMP , CBC COVID TEST TODAY - [address] AT 2:25 PM- SELF ISOLATE UNTIL CATHETERIZATION SEPT 3 ,2020 .  4. Medication instructions in preparation for your procedure:  -- PLAVIX 75 MG  THIS MORNING  On the morning of your procedure, take your Aspirin 81 MG  and any morning medicines NOT listed above.  You may use sips of water.  5. Plan for one night stay--bring personal belongings. 6. Bring a current list of your medications and current insurance cards. 7. You MUST have a responsible person to drive you home. 8. Someone MUST be with you the first 24 hours after you arrive home or your discharge will be delayed. 9. Please wear clothes that are easy to get on and off and wear slip-on shoes.  Thank you for allowing [place] to care for you!   -- [place] [facility]

## 2018-12-14 NOTE — Assessment & Plan Note (Signed)
Blood pressure stable on amlodipine, did not tolerate higher dose.  We substituted the amlodipine for lisinopril.  Has been pretty much resting bradycardia which would preclude [place] from using beta-blocker for now.

## 2018-12-14 NOTE — Assessment & Plan Note (Signed)
His symptoms are not classic for angina, however she is now starting to have exertional symptoms as opposed to just resting symptoms. Plan: Reschedule for PCI

## 2018-12-14 NOTE — Progress Notes (Signed)
PCP: Lajean Manes, MD  Clinic Note: Chief Complaint  Patient presents with  . Follow-up    Discuss cath options.  . Coronary Artery Disease    Noted on cath, failed medical management  . Chest Pain    Atypical angina    HPI: Ian [name] is a 73 y.o. male with a PMH of CAD having PCI with recurrent angina below who presents today for post-cath follow-up and to discuss potential plans for PCI.Marland Kitchen  Ian [name] was last seen by Kerin Ransom on July 29 to discuss continued chest discomfort episodes despite a nonischemic Myoview  Recent Hospitalizations: For heart catheterization August 2020  Studies Personally Reviewed - (if available, images/films reviewed: From Epic Chart or Care Everywhere)  Cardiac Cath-DFR November 17, 2018: EF 40 to 45% global HK.  Mid-distal LAD extensive 50 to 60% stenosis, calcified.  DFR positive distally (initial plans for medical management).  D1 stent patent with 70% stenosis post stent, ostial lateral OM1 80% (smaller to OM branches) --> initial plans were for medical management to allow for prostate surgery  I had telephone conversation with [name] last week, and he indicated that although symptoms were somewhat improved with the addition of Ranexa and and using amlodipine as opposed to lisinopril, he still is having chest discomfort spells that are mostly at rest, or after exertion.  He also informed me that he had the MRI done to evaluate his prostate and they are thinking they can delay the need for surgery for at least 1 more year.  As such, he would like to go ahead and proceed with potential PCI.  Interval History: Ian [name] returns today noting that he has had some benefit with the Ranexa and amlodipine, however he still is having episodes of discomfort in his chest that may be associated with doing yard work but is usually associated at rest.  It usually occurs when he is sedentary often lying down to go to bed or relaxing after activity.  Episodes are  relatively short-lived lasting a 5 to 10 minutes and go away with rest and deep breathing. He has noted a little bit more exertional dyspnea as well.  No PND, orthopnea or with minimal edema.  No palpitations, lightheadedness, dizziness, weakness or syncope/near syncope. No TIA/amaurosis fugax symptoms. No melena, hematochezia, hematuria, or epstaxis. No claudication.  ROS: A comprehensive was performed. Review of Systems  Constitutional: Positive for malaise/fatigue (Exercise intolerance). Negative for weight loss.  HENT: Negative for congestion and nosebleeds.   Respiratory: Negative for cough, shortness of breath and wheezing.   Gastrointestinal: Negative for blood in stool, heartburn, melena and nausea.  Genitourinary: Negative for dysuria and hematuria.  Musculoskeletal: Negative for falls, joint pain and myalgias.  Neurological: Negative for focal weakness.  Endo/Heme/Allergies: Negative for environmental allergies.  Psychiatric/Behavioral: Negative for depression and memory loss. The patient is not nervous/anxious and does not have insomnia.   All other systems reviewed and are negative. -Chronic foot drop on the right side  The patient does not have symptoms concerning for COVID-19 infection (fever, chills, cough, or new shortness of breath).  The patient is practicing social distancing.   COVID-19 Education: The signs and symptoms of COVID-19 were discussed with the patient and how to seek care for testing (follow up with PCP or arrange E-visit).   The importance of social distancing was discussed today.   I have reviewed and (if needed) personally updated the patient's problem list, medications, allergies, past medical and surgical history,  social and family history.   Past Medical History:  Diagnosis Date  . Arthritis   . Atherosclerosis of aorta ([place])   . CAD S/P percutaneous coronary angioplasty 2004   06-28-2002  Cardiac Cath subtotal TO of D1 -- staged PCI with  Taxus DES 2.5 x 12; Cath (abn Nuc ST) 08/2011: 30% pLAD & pD1 with patent D1 stent.  EF ~45%.; Cath 11/17/2018: long ~50-60% m-dLAD, 70% pre & post stent in D1, Ostial lat OM1 80% -- failed Med Rx  . Diverticulosis   . GERD (gastroesophageal reflux disease)   . History of atrial fibrillation without current medication    one episode in 2011-- per cardiologist note has not had any other issues  . History of basal cell carcinoma (BCC) excision    x2  forehead  . History of kidney stones   . History of melanoma excision    scalp s/p moh's  . HTN (hypertension)   . Hypercholesterolemia   . Lung nodule    rt lower lobe  . Prostate cancer ([place])   . Renal cyst   . S/P drug eluting coronary stent placement 06/28/2002   x1  to D1  . Varicose veins    wear teds  . Wears glasses   . Wears hearing aid in both ears     Past Surgical History:  Procedure Laterality Date  . BRACHIAL NERVE REPAIR Left 2007   approx.   RELEASE  . BUNIONECTOMY Right 2008   approx.   and Hammer toe correction  . CARPAL TUNNEL RELEASE Right 2018  . CERVICAL FUSION  ~2008;   07-21-2009   dr Carloyn Manner   posterior C2-3 2008 with screws;   cage fusion C2-3 07-21-2009  . CERVICAL GANGLIONECTOMY  1995   C2  posterior  . CORONARY ANGIOPLASTY  06-28-2002   dr Leonia Reeves  @[facility]    PCI and DES x1  to Diagonal 1  . INGUINAL HERNIA REPAIR Left 08/08/2016   Procedure: OPEN LEFT INGUINAL HERNIA REPAIR WITH MESH;  Surgeon: Armandina Gemma, MD;  Location: [facility];  Service: General;  Laterality: Left;  . INSERTION OF MESH Left 08/08/2016   Procedure: INSERTION OF MESH;  Surgeon: Armandina Gemma, MD;  Location: [facility];  Service: General;  Laterality: Left;  . INTRAVASCULAR PRESSURE WIRE/FFR STUDY N/A 11/17/2018   Procedure: INTRAVASCULAR PRESSURE WIRE/FFR STUDY;  Surgeon: Leonie Man, MD;  Location: [place] CV LAB;  Service: Cardiovascular;  Laterality: N/A;  . LEFT HEART CATH AND CORONARY ANGIOGRAPHY N/A 11/17/2018   Procedure: LEFT HEART CATH  AND CORONARY ANGIOGRAPHY;  Surgeon: Leonie Man, MD;  Location: [facility] INVASIVE CV LAB;;; EF 40 to 45% global HK.  Mid-distal LAD extensive 50 to 60% stenosis, calcified.  DFR positive distally (initial plans for medical management).  D1 stent patent with 70% stenosis post stent, ostial lateral OM1 80% (smaller to OM branches) --> initial plans =Med Rx  . LEFT HEART CATHETERIZATION WITH CORONARY ANGIOGRAM N/A 11/07/2011   Procedure: LEFT HEART CATHETERIZATION WITH CORONARY ANGIOGRAM;  Surgeon: Candee Furbish, MD;  Location: [facility] CATH LAB;  Service: Cardiovascular: - Equivocal persistent symptoms despite nonischemic Myoview. EF ~45%. 30% calcified pLAD, 30% D1 prestent. Widely paent D1 DES stent.  . [place], 2010, 2011   Total 3 surgeries  . POSTERIOR LUMBAR FUSION  07-17 & 21-- 2019  dr Carloyn Manner  @ [facility] in [place]   staged  T10 -- S1  . REVERSE SHOULDER ARTHROPLASTY Right 11/14/2017  . REVERSE  SHOULDER ARTHROPLASTY Right 11/13/2017   Procedure: REVERSE RIGHT SHOULDER ARTHROPLASTY;  Surgeon: Justice Britain, MD;  Location: [place];  Service: Orthopedics;  Laterality: Right;  . TRIGGER FINGER RELEASE Right 09-26-2005   dr sypher  @[facility]    right ring finger    Current Meds  Medication Sig  . amLODipine (NORVASC) 2.5 MG tablet Take 1 tablet (2.5 mg total) by mouth daily.  Marland Kitchen aspirin EC 81 MG tablet Take 81 mg by mouth at bedtime.   . Calcium Carb-Cholecalciferol (CALCIUM 600+D) 600-800 MG-UNIT TABS Take 1 tablet by mouth 2 (two) times daily.  . cetirizine (ZYRTEC) 10 MG tablet Take 10 mg by mouth at bedtime.   . Coenzyme Q10 (COQ-10) 400 MG CAPS Take 400 mg by mouth daily.  Marland Kitchen gabapentin (NEURONTIN) 100 MG capsule Take 300 mg by mouth 3 (three) times daily as needed (severe pain.).   Marland Kitchen Glucosamine HCl-MSM (GLUCOSAMINE-MSM PO) Take 2 tablets by mouth daily.   Marland Kitchen HYDROcodone-acetaminophen (NORCO/VICODIN) 5-325 MG tablet Take 1 tablet by mouth every 6 (six) hours as needed (pain.).  Marland Kitchen magnesium  hydroxide (MILK OF MAGNESIA) 400 MG/5ML suspension Take 15-30 mLs by mouth daily as needed for mild constipation.   . methocarbamol (ROBAXIN) 500 MG tablet Take 1 tablet (500 mg total) by mouth every 6 (six) hours as needed for muscle spasms.  . Misc Natural Products (TART CHERRY ADVANCED) CAPS Take 2 capsules by mouth daily.   . Multiple Vitamins-Minerals (OCUVITE ADULT 50+) CAPS Take 1 capsule by mouth daily.  . nitroGLYCERIN (NITROSTAT) 0.4 MG SL tablet Place 1 tablet (0.4 mg total) under the tongue every 5 (five) minutes as needed for chest pain.  . ranolazine (RANEXA) 500 MG 12 hr tablet Take 1 tablet (500 mg total) by mouth 2 (two) times daily.  . riboflavin (VITAMIN B-2) 100 MG TABS tablet Take 400 mg by mouth daily.  . sildenafil (VIAGRA) 50 MG tablet Take 50 mg by mouth daily as needed for erectile dysfunction.  . vitamin C (ASCORBIC ACID) 500 MG tablet Take 1,000 mg by mouth daily.   Marland Kitchen XIIDRA 5 % SOLN Place 1 drop into both eyes 2 (two) times daily as needed for dry eyes.  . [DISCONTINUED] esomeprazole (NEXIUM) 20 MG capsule Take 20 mg by mouth 2 (two) times a day.     Allergies  Allergen Reactions  . Isosorbide Other (See Comments)     Dropped blood pressure , Develop severe headache   . Statins   . Adhesive [Tape] Rash and Other (See Comments)    Band aid adhesive ---- RASH Skin peels off  . Codeine Nausea And Vomiting  . Demerol [Meperidine] Nausea And Vomiting  . Lescol [Fluvastatin Sodium] Other (See Comments)    Muscle pain   . Morphine And Related Nausea And Vomiting  . Oxycodone Nausea And Vomiting  . Penicillins Nausea And Vomiting and Other (See Comments)    Has patient had a PCN reaction causing immediate rash, facial/tongue/throat swelling, SOB or lightheadedness with hypotension: No Has patient had a PCN reaction causing severe rash involving mucus membranes or skin necrosis: No Has patient had a PCN reaction that required hospitalization No Has patient had a  PCN reaction occurring within the last 10 years: No If all of the above answers are "NO", then may proceed with Cephalosporin use.   Christy Sartorius Sodium] Other (See Comments)    Muscle pain   . Welchol [Colesevelam Hcl] Other (See Comments)    Weakness   .  Zetia [Ezetimibe] Other (See Comments)    Muscle pain     Social History   Tobacco Use  . Smoking status: Never Smoker  . Smokeless tobacco: Never Used  Substance Use Topics  . Alcohol use: No  . Drug use: No   Social History   Social History Narrative   He is a married father of 2, grandfather of 70. He currently lives with his wife. 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 He is active, but does not do routine exercise. He does most yard work and is able to walk in the stores etc.    family history includes Cancer in his sister; Cancer - Prostate in his brother; Cervical cancer in his maternal grandmother; Coronary artery disease in his father; Dementia in his mother; Esophageal cancer in his father; Heart attack (age of onset: 45) in his maternal grandfather; Hypertension in his mother; Prostate cancer in his brother, father, and paternal uncle.  Wt Readings from Last 3 Encounters:  12/14/18 188 lb (85.3 kg)  11/17/18 185 lb (83.9 kg)  11/11/18 184 lb (83.5 kg)    PHYSICAL EXAM BP 136/80 (BP Location: Left Arm, Patient Position: Sitting, Cuff Size: Normal)   Pulse 64   Temp (!) 97.3 F (36.3 C)   Ht 5\' 11"  (1.803 m)   Wt 188 lb (85.3 kg)   BMI 26.22 kg/m  Physical Exam  Constitutional: He is oriented to person, place, and time. He appears well-developed and well-nourished. No distress.  Healthy-appearing.  Well-groomed  HENT:  Head: Normocephalic and atraumatic.  Eyes: Pupils are equal, round, and reactive to light. EOM are normal.  Neck: Normal range of motion. Neck supple. No hepatojugular reflux and no JVD present. Carotid bruit is not present.   Cardiovascular: Normal rate, regular rhythm, normal heart sounds and intact distal pulses.  No extrasystoles are present. PMI is not displaced. Exam reveals no gallop and no friction rub.  No murmur heard. Pulmonary/Chest: Breath sounds normal. No respiratory distress. He has no wheezes. He has no rales.  Abdominal: Bowel sounds are normal. He exhibits no distension. There is no abdominal tenderness. There is no rebound.  Musculoskeletal:        General: Edema (Left greater than right with plump swollen varicose veins) present.  Neurological: He is alert and oriented to person, place, and time. No cranial nerve deficit.  Skin: Skin is warm and dry.  No venous stasis changes with mild spider veins  Psychiatric: He has a normal mood and affect. His behavior is normal. Judgment and thought content normal.  Vitals reviewed.    Adult ECG Report Not checked  Other studies Reviewed: Additional studies/ records that were reviewed today include:  Recent Labs:   Lab Results  Component Value Date   CREATININE 0.82 11/13/2018   BUN 19 11/13/2018   NA 146 (H) 11/13/2018   K 4.3 11/13/2018   CL 106 11/13/2018   CO2 25 11/13/2018   No results found for: CHOL, HDL, LDLCALC, LDLDIRECT, TRIG, CHOLHDL   ASSESSMENT / PLAN: Problem List Items Addressed This Visit    Essential hypertension (Chronic)    Blood pressure stable on amlodipine, did not tolerate higher dose.  We substituted the amlodipine for lisinopril.  Has been pretty much resting bradycardia which would preclude [place] from using beta-blocker for now.      Coronary artery disease involving native coronary artery of native heart with angina pectoris ([place]) - Primary (Chronic)  He has at least 3 different areas that could be causing angina.  Clearly the ostial lateral branch of the OM has a tight lesion, and there is DFR positive lesion in the LAD that would be relatively long lesion somewhat calcified but treatable.  The diagonal also has  disease on either side of the stent but would probably only be a candidate for PTCA and not PCI.  We have tried Ranexa and amlodipine to the best he can tolerate and is still having symptoms.  Since he is hoping that he can delay his prostate surgery for least 6 to 12 months, I think we will now proceed with PCI at least of the LAD and [place] PTCA of the OM branch.  Likely just PTCA of the diagonal.  We will go ahead and load with Plavix 300 mg starting today and then tonight started him milligrams daily afterwards. Will probably need more aggressive lipid management, may need to consider PCSK9 inhibitor versus bempedoic acid      Relevant Orders   CBC   Basic metabolic panel   Atypical angina ([place])    His symptoms are not classic for angina, however she is now starting to have exertional symptoms as opposed to just resting symptoms. Plan: Reschedule for PCI      Relevant Orders   CBC   Basic metabolic panel      I spent a total of 25 minutes with the patient and chart review. >  50% of the time was spent in direct patient consultation.   Current medicines are reviewed at length with the patient today.  (+/- concerns) none The following changes have been made:  None  Patient Instructions  Medication Instructions:  - CHANGE TO PROTONIX 40  MG DAILY - PLAVIX 75  DAILY - STARTING TONIGHT TAKE 4 TABLET TONIGHT , THEN TOMORROW  TAKE ONE TABLET DAILY   If you need a refill on your cardiac medications before your next appointment, please call your pharmacy.   Lab work: CBC BMP COVID TEST- TODAY  Testing/Procedures:  SCHEDULE FOR SEPT 3, 2020 AT [place] Your physician has requested that you have a cardiac catheterization. Cardiac catheterization is used to diagnose and/or treat various heart conditions. Doctors may recommend this procedure for a number of different reasons. The most common reason is to evaluate chest pain. Chest pain can be a  symptom of coronary artery disease (CAD), and cardiac catheterization can show whether plaque is narrowing or blocking your heart's arteries. This procedure is also used to evaluate the valves, as well as measure the blood flow and oxygen levels in different parts of your heart. For further information please visit HugeFiesta.tn. Please follow instruction sheet, as given.   Follow-Up: At [facility], you and your health needs are our priority.  As part of our continuing mission to provide you with exceptional heart care, we have created designated Provider Care Teams.  These Care Teams include your primary Cardiologist (physician) and Advanced Practice Providers (APPs -  Physician Assistants and Nurse Practitioners) who all work together to provide you with the care you need, when you need it. You will need keep follow up appointment   With Kerin Ransom PA    Any Other Special Instructions Will Be Listed Below (If Applicable).         [place] [address] Dept: [phone] Loc: [place]  ADIN TROUPE  12/14/2018  You are scheduled for a Cardiac Catheterization on Thursday, September 3 with Dr. Glenetta Hew.  1. Please arrive at the [facility] (Main Entrance A) at [facility]: [address] [place], [place] [address] at 5:30 AM (This time is two hours before your procedure to ensure your preparation). Free valet parking service is available.   Special note: Every effort is made to have your procedure done on time. Please understand that emergencies sometimes delay scheduled procedures.  2. Diet: Do not eat solid foods after midnight.  The patient may have clear liquids until 5am upon the day of the procedure.  3. Labs: You will need to have blood drawn  Today - BMP , CBC COVID TEST TODAY - [address] AT 2:25 PM- SELF ISOLATE UNTIL  CATHETERIZATION SEPT 3 ,2020 .  4. Medication instructions in preparation for your procedure:  -- PLAVIX 75 MG  THIS MORNING  On the morning of your procedure, take your Aspirin 81 MG  and any morning medicines NOT listed above.  You may use sips of water.  5. Plan for one night stay--bring personal belongings. 6. Bring a current list of your medications and current insurance cards. 7. You MUST have a responsible person to drive you home. 8. Someone MUST be with you the first 24 hours after you arrive home or your discharge will be delayed. 9. Please wear clothes that are easy to get on and off and wear slip-on shoes.  Thank you for allowing [place] to care for you!   -- [place] [facility]        Studies Ordered:   Orders Placed This Encounter  Procedures  . CBC  . Basic metabolic panel      Glenetta Hew, M.D., M.S. Interventional Cardiologist   Pager # [phone] Phone # [phone] [address] [place], [place] [address]   Thank you for choosing Heartcare at [facility]!!

## 2018-12-14 NOTE — Assessment & Plan Note (Signed)
He has at least 3 different areas that could be causing angina.  Clearly the ostial lateral branch of the OM has a tight lesion, and there is DFR positive lesion in the LAD that would be relatively long lesion somewhat calcified but treatable.  The diagonal also has disease on either side of the stent but would probably only be a candidate for PTCA and not PCI.  We have tried Ranexa and amlodipine to the best he can tolerate and is still having symptoms.  Since he is hoping that he can delay his prostate surgery for least 6 to 12 months, I think we will now proceed with PCI at least of the LAD and Cut[place] Ball[place] PTCA of the OM branch.  Likely just PTCA of the diagonal.  We will go ahead and load with Plavix 300 mg starting today and then tonight started him milligrams daily afterwards. Will probably need more aggressive lipid management, may need to consider PCSK9 inhibitor versus bempedoic acid

## 2018-12-15 ENCOUNTER — Telehealth: Payer: Self-pay | Admitting: *Deleted

## 2018-12-15 LAB — BASIC METABOLIC PANEL
BUN/Creatinine Ratio: 23 (ref 10–24)
BUN: 22 mg/dL (ref 8–27)
CO2: 27 mmol/L (ref 20–29)
Calcium: 9.3 mg/dL (ref 8.6–10.2)
Chloride: 104 mmol/L (ref 96–106)
Creatinine, Ser: 0.94 mg/dL (ref 0.76–1.27)
GFR calc Af Amer: 94 mL/min/{1.73_m2} (ref >59)
GFR calc non Af Amer: 81 mL/min/{1.73_m2} (ref >59)
Glucose: 91 mg/dL (ref 65–99)
Potassium: 4.6 mmol/L (ref 3.5–5.2)
Sodium: 143 mmol/L (ref 134–144)

## 2018-12-15 LAB — CBC
Hematocrit: 44.2 % (ref 37.5–51.0)
Hemoglobin: 14.6 g/dL (ref 13.0–17.7)
MCH: 29.4 pg (ref 26.6–33.0)
MCHC: 33 g/dL (ref 31.5–35.7)
MCV: 89 fL (ref 79–97)
Platelets: 190 10*3/uL (ref 150–450)
RBC: 4.97 x10E6/uL (ref 4.14–5.80)
RDW: 12.9 % (ref 11.6–15.4)
WBC: 5.1 10*3/uL (ref 3.4–10.8)

## 2018-12-15 LAB — SARS CORONAVIRUS 2 (TAT 6-24 HRS): SARS Coronavirus 2: NEGATIVE

## 2018-12-15 NOTE — Telephone Encounter (Addendum)
Pt contacted pre-catheterization scheduled at [facility] for: Thursday December 17, 2018 7:30 AM Verified arrival time and place: [facility]) at: 5:30 AM   No solid food after midnight prior to cath, clear liquids until 5 AM day of procedure. Contrast allergy: no  Hold: Viagra until post procedure  AM meds can be  taken pre-cath with sip of water including: ASA 81 mg Plavix 75 mg  Confirmed patient has responsible person to drive home post procedure and observe 24 hours after arriving home: yes  Currently, due to Covid-19 pandemic, only one support person will be allowed with patient. Must be the same support person for that patient's entire stay, will be screened and required to wear a mask. They will be asked to wait in the waiting room for the duration of the patient's stay.  Patients are required to wear a mask when they enter the hospital.      COVID-19 Pre-Screening Questions:  . In the past 7 to 10 days have you had a cough,  shortness of breath, headache, congestion, fever (100 or greater) body aches, chills, sore throat, or sudden loss of taste or sense of smell? no . Have you been around anyone with known Covid 19? no . Have you been around anyone who is awaiting Covid 19 test results in the past 7 to 10 days? no . Have you been around anyone who has been exposed to Covid 19, or has mentioned symptoms of Covid 19 within the past 7 to 10 days? no  I reviewed procedure/mask/visitor instructions,  Covid-19 screening questions with patient, he verbalized understanding, thanked me for call.

## 2018-12-16 MED ORDER — SODIUM CHLORIDE 0.9% FLUSH: 3.0000 mL | INTRAVENOUS | Status: DC | PRN

## 2018-12-16 MED ORDER — SODIUM CHLORIDE 0.9 % IV SOLN: 250.0000 mL | INTRAVENOUS | Status: DC | PRN

## 2018-12-16 MED ORDER — CLOPIDOGREL BISULFATE 75 MG PO TABS: 75.0000 mg | ORAL_TABLET | ORAL | Status: AC

## 2018-12-16 MED ORDER — SODIUM CHLORIDE 0.9 % WEIGHT BASED INFUSION: 1.0000 mL/kg/h | INTRAVENOUS | Status: DC

## 2018-12-16 MED ORDER — SODIUM CHLORIDE 0.9 % WEIGHT BASED INFUSION
3.0000 mL/kg/h | INTRAVENOUS | Status: DC
  Administered 2018-12-17: 3 mL/kg/h via INTRAVENOUS

## 2018-12-16 MED ORDER — SODIUM CHLORIDE 0.9% FLUSH: 3.0000 mL | Freq: Two times a day (BID) | INTRAVENOUS | Status: DC

## 2018-12-16 MED ORDER — ASPIRIN 81 MG PO CHEW
81.0000 mg | CHEWABLE_TABLET | ORAL | Status: AC
  Administered 2018-12-17: 81 mg via ORAL
  Filled 2018-12-16: qty 1

## 2018-12-17 ENCOUNTER — Encounter (HOSPITAL_COMMUNITY): Payer: Self-pay | Admitting: Cardiology

## 2018-12-17 ENCOUNTER — Other Ambulatory Visit: Payer: Self-pay

## 2018-12-17 ENCOUNTER — Ambulatory Visit (HOSPITAL_COMMUNITY)
Admission: RE | Admit: 2018-12-17 | Discharge: 2018-12-18 | Disposition: A | Discharge status: Home / Self Care | Payer: Medicare Other | Attending: Cardiology | Admitting: Cardiology

## 2018-12-17 ENCOUNTER — Encounter (HOSPITAL_COMMUNITY)
Admission: RE | Disposition: A | Discharge status: Home / Self Care | Payer: Self-pay | Source: Home / Self Care | Attending: Cardiology

## 2018-12-17 DIAGNOSIS — Z9861 Coronary angioplasty status: Secondary | ICD-10-CM

## 2018-12-17 DIAGNOSIS — Z888 Allergy status to other drugs, medicaments and biological substances status: Secondary | ICD-10-CM | POA: Diagnosis not present

## 2018-12-17 DIAGNOSIS — I2510 Atherosclerotic heart disease of native coronary artery without angina pectoris: Secondary | ICD-10-CM

## 2018-12-17 DIAGNOSIS — Z7902 Long term (current) use of antithrombotics/antiplatelets: Secondary | ICD-10-CM | POA: Diagnosis not present

## 2018-12-17 DIAGNOSIS — I10 Essential (primary) hypertension: Secondary | ICD-10-CM | POA: Diagnosis present

## 2018-12-17 DIAGNOSIS — Z880 Allergy status to penicillin: Secondary | ICD-10-CM | POA: Insufficient documentation

## 2018-12-17 DIAGNOSIS — I208 Other forms of angina pectoris: Secondary | ICD-10-CM | POA: Diagnosis present

## 2018-12-17 DIAGNOSIS — Z79899 Other long term (current) drug therapy: Secondary | ICD-10-CM | POA: Insufficient documentation

## 2018-12-17 DIAGNOSIS — I4891 Unspecified atrial fibrillation: Secondary | ICD-10-CM | POA: Diagnosis not present

## 2018-12-17 DIAGNOSIS — Z7982 Long term (current) use of aspirin: Secondary | ICD-10-CM | POA: Diagnosis not present

## 2018-12-17 DIAGNOSIS — Z8679 Personal history of other diseases of the circulatory system: Secondary | ICD-10-CM

## 2018-12-17 DIAGNOSIS — Z8546 Personal history of malignant neoplasm of prostate: Secondary | ICD-10-CM | POA: Insufficient documentation

## 2018-12-17 DIAGNOSIS — K5730 Diverticulosis of large intestine without perforation or abscess without bleeding: Secondary | ICD-10-CM | POA: Insufficient documentation

## 2018-12-17 DIAGNOSIS — Z955 Presence of coronary angioplasty implant and graft: Secondary | ICD-10-CM | POA: Insufficient documentation

## 2018-12-17 DIAGNOSIS — N400 Enlarged prostate without lower urinary tract symptoms: Secondary | ICD-10-CM | POA: Insufficient documentation

## 2018-12-17 DIAGNOSIS — K219 Gastro-esophageal reflux disease without esophagitis: Secondary | ICD-10-CM | POA: Insufficient documentation

## 2018-12-17 DIAGNOSIS — I25118 Atherosclerotic heart disease of native coronary artery with other forms of angina pectoris: Secondary | ICD-10-CM | POA: Diagnosis not present

## 2018-12-17 DIAGNOSIS — E785 Hyperlipidemia, unspecified: Secondary | ICD-10-CM | POA: Diagnosis not present

## 2018-12-17 DIAGNOSIS — Z885 Allergy status to narcotic agent status: Secondary | ICD-10-CM | POA: Insufficient documentation

## 2018-12-17 DIAGNOSIS — C61 Malignant neoplasm of prostate: Secondary | ICD-10-CM | POA: Diagnosis present

## 2018-12-17 HISTORY — PX: CORONARY STENT INTERVENTION: CATH118234

## 2018-12-17 LAB — POCT ACTIVATED CLOTTING TIME
Activated Clotting Time: 252 seconds
Activated Clotting Time: 329 seconds
Activated Clotting Time: 290 seconds

## 2018-12-17 SURGERY — CORONARY STENT INTERVENTION
Anesthesia: LOCAL

## 2018-12-17 MED ORDER — ACETAMINOPHEN 325 MG PO TABS: 650.0000 mg | ORAL_TABLET | ORAL | Status: DC | PRN

## 2018-12-17 MED ORDER — VERAPAMIL HCL 2.5 MG/ML IV SOLN
INTRAVENOUS | Status: AC
  Filled 2018-12-17: qty 2

## 2018-12-17 MED ORDER — AMLODIPINE BESYLATE 2.5 MG PO TABS: 2.5000 mg | ORAL_TABLET | Freq: Every day | ORAL | Status: DC

## 2018-12-17 MED ORDER — GABAPENTIN 300 MG PO CAPS: 300.0000 mg | ORAL_CAPSULE | Freq: Three times a day (TID) | ORAL | Status: DC | PRN

## 2018-12-17 MED ORDER — FENTANYL CITRATE (PF) 100 MCG/2ML IJ SOLN
INTRAMUSCULAR | Status: DC | PRN
  Administered 2018-12-17: 50 ug via INTRAVENOUS

## 2018-12-17 MED ORDER — PANTOPRAZOLE SODIUM 40 MG PO TBEC
40.0000 mg | DELAYED_RELEASE_TABLET | Freq: Every day | ORAL | Status: DC
  Administered 2018-12-18: 40 mg via ORAL
  Filled 2018-12-17: qty 1

## 2018-12-17 MED ORDER — ONDANSETRON HCL 4 MG/2ML IJ SOLN: 4.0000 mg | Freq: Four times a day (QID) | INTRAMUSCULAR | Status: DC | PRN

## 2018-12-17 MED ORDER — POLYVINYL ALCOHOL 1.4 % OP SOLN: 1.0000 [drp] | Freq: Two times a day (BID) | OPHTHALMIC | Status: DC | PRN

## 2018-12-17 MED ORDER — SODIUM CHLORIDE 0.9 % IV SOLN: INTRAVENOUS | Status: AC

## 2018-12-17 MED ORDER — HYDRALAZINE HCL 20 MG/ML IJ SOLN: 10.0000 mg | INTRAMUSCULAR | Status: AC | PRN

## 2018-12-17 MED ORDER — SODIUM CHLORIDE 0.9 % IV SOLN: 250.0000 mL | INTRAVENOUS | Status: DC | PRN

## 2018-12-17 MED ORDER — FENTANYL CITRATE (PF) 100 MCG/2ML IJ SOLN
INTRAMUSCULAR | Status: AC
  Filled 2018-12-17: qty 2

## 2018-12-17 MED ORDER — HEPARIN (PORCINE) IN NACL 1000-0.9 UT/500ML-% IV SOLN
INTRAVENOUS | Status: DC | PRN
  Administered 2018-12-17 (×2): 500 mL

## 2018-12-17 MED ORDER — HEPARIN SODIUM (PORCINE) 1000 UNIT/ML IJ SOLN
INTRAMUSCULAR | Status: AC
  Filled 2018-12-17: qty 1

## 2018-12-17 MED ORDER — SODIUM CHLORIDE 0.9% FLUSH: 3.0000 mL | Freq: Two times a day (BID) | INTRAVENOUS | Status: DC

## 2018-12-17 MED ORDER — NITROGLYCERIN 0.4 MG SL SUBL: 0.4000 mg | SUBLINGUAL_TABLET | SUBLINGUAL | Status: DC | PRN

## 2018-12-17 MED ORDER — HEPARIN (PORCINE) IN NACL 1000-0.9 UT/500ML-% IV SOLN
INTRAVENOUS | Status: AC
  Filled 2018-12-17: qty 1000

## 2018-12-17 MED ORDER — COQ-10 400 MG PO CAPS: 400.0000 mg | ORAL_CAPSULE | Freq: Every day | ORAL | Status: DC

## 2018-12-17 MED ORDER — CLOPIDOGREL BISULFATE 75 MG PO TABS
75.0000 mg | ORAL_TABLET | Freq: Every day | ORAL | Status: DC
  Administered 2018-12-18: 75 mg via ORAL
  Filled 2018-12-17: qty 1

## 2018-12-17 MED ORDER — VERAPAMIL HCL 2.5 MG/ML IV SOLN
INTRAVENOUS | Status: DC | PRN
  Administered 2018-12-17: 10 mL via INTRA_ARTERIAL

## 2018-12-17 MED ORDER — ASPIRIN EC 81 MG PO TBEC: 81.0000 mg | DELAYED_RELEASE_TABLET | Freq: Every day | ORAL | Status: DC

## 2018-12-17 MED ORDER — NITROGLYCERIN 1 MG/10 ML FOR IR/CATH LAB
INTRA_ARTERIAL | Status: AC
  Filled 2018-12-17: qty 10

## 2018-12-17 MED ORDER — RANOLAZINE ER 500 MG PO TB12
500.0000 mg | ORAL_TABLET | Freq: Two times a day (BID) | ORAL | Status: DC
  Administered 2018-12-17: 500 mg via ORAL
  Filled 2018-12-17: qty 1

## 2018-12-17 MED ORDER — METHOCARBAMOL 500 MG PO TABS: 500.0000 mg | ORAL_TABLET | Freq: Four times a day (QID) | ORAL | Status: DC | PRN

## 2018-12-17 MED ORDER — MIDAZOLAM HCL 2 MG/2ML IJ SOLN
INTRAMUSCULAR | Status: AC
  Filled 2018-12-17: qty 2

## 2018-12-17 MED ORDER — LABETALOL HCL 5 MG/ML IV SOLN: 10.0000 mg | INTRAVENOUS | Status: AC | PRN

## 2018-12-17 MED ORDER — IOHEXOL 350 MG/ML SOLN
INTRAVENOUS | Status: DC | PRN
  Administered 2018-12-17: 09:00:00 145 mL via INTRA_ARTERIAL

## 2018-12-17 MED ORDER — LIDOCAINE HCL (PF) 1 % IJ SOLN
INTRAMUSCULAR | Status: AC
  Filled 2018-12-17: qty 30

## 2018-12-17 MED ORDER — MIDAZOLAM HCL 2 MG/2ML IJ SOLN
INTRAMUSCULAR | Status: DC | PRN
  Administered 2018-12-17: 2 mg via INTRAVENOUS

## 2018-12-17 MED ORDER — NITROGLYCERIN 1 MG/10 ML FOR IR/CATH LAB
INTRA_ARTERIAL | Status: DC | PRN
  Administered 2018-12-17 (×3): 200 ug via INTRACORONARY

## 2018-12-17 MED ORDER — HEPARIN SODIUM (PORCINE) 1000 UNIT/ML IJ SOLN
INTRAMUSCULAR | Status: DC | PRN
  Administered 2018-12-17: 7000 [IU] via INTRAVENOUS
  Administered 2018-12-17: 2000 [IU] via INTRAVENOUS

## 2018-12-17 MED ORDER — LIDOCAINE HCL (PF) 1 % IJ SOLN
INTRAMUSCULAR | Status: DC | PRN
  Administered 2018-12-17: 2 mL

## 2018-12-17 MED ORDER — MAGNESIUM HYDROXIDE 400 MG/5ML PO SUSP: 15.0000 mL | Freq: Every day | ORAL | Status: DC | PRN

## 2018-12-17 MED ORDER — SODIUM CHLORIDE 0.9% FLUSH: 3.0000 mL | INTRAVENOUS | Status: DC | PRN

## 2018-12-17 SURGICAL SUPPLY — 20 items
BALLN SAPPHIRE 2.25X20 (BALLOONS) ×2
BALLN SAPPHIRE ~~LOC~~ 2.5X15 (BALLOONS) ×1 IMPLANT
BALLN WOLVERINE 2.00X6 (BALLOONS) ×2
BALLOON SAPPHIRE 2.25X20 (BALLOONS) ×1 IMPLANT
BALLOON WOLVERINE 2.00X6 (BALLOONS) IMPLANT
CATH INFINITI JR4 5F (CATHETERS) ×1 IMPLANT
CATH VISTA GUIDE 6FR XBLAD3.5 (CATHETERS) ×2 IMPLANT
DEVICE RAD COMP TR BAND LRG (VASCULAR PRODUCTS) ×1 IMPLANT
GLIDESHEATH SLEND SS 6F .021 (SHEATH) ×1 IMPLANT
GUIDEWIRE INQWIRE 1.5J.035X260 (WIRE) IMPLANT
INQWIRE 1.5J .035X260CM (WIRE) ×2
KIT ENCORE 26 ADVANTAGE (KITS) ×1 IMPLANT
KIT HEART LEFT (KITS) ×2 IMPLANT
PACK CARDIAC CATHETERIZATION (CUSTOM PROCEDURE TRAY) ×2 IMPLANT
SHEATH PROBE COVER 6X72 (BAG) ×1 IMPLANT
STENT RESOLUTE ONYX 2.25X34 (Permanent Stent) ×1 IMPLANT
TRANSDUCER W/STOPCOCK (MISCELLANEOUS) ×2 IMPLANT
TUBING CIL FLEX 10 FLL-RA (TUBING) ×2 IMPLANT
WIRE ASAHI PROWATER 180CM (WIRE) ×1 IMPLANT
WIRE HI TORQ BMW 190CM (WIRE) ×1 IMPLANT

## 2018-12-17 NOTE — Interval H&P Note (Signed)
History and Physical Interval Note:  12/17/2018 7:13 AM  Ian [name]  has presented today for surgery, with the diagnosis of Atypical Angina - CAD with Angina.  The various methods of treatment have been discussed with the patient and family. After consideration of risks, benefits and other options for treatment, the patient has consented to  Procedure(s): CORONARY STENT INTERVENTION (N/A)  PERCUTANEOUS CORONARY INTERVENTION  as a surgical intervention.  The patient's history has been reviewed, patient examined, no change in status, stable for surgery.  I have reviewed the patient's chart and labs.  Questions were answered to the patient's satisfaction.    Cath Lab Visit (complete for each Cath Lab visit)  Clinical Evaluation Leading to the Procedure:   ACS: No.  Non-ACS:    Anginal Classification: CCS III  Anti-ischemic medical therapy: Maximal Therapy (2 or more classes of medications)  Non-Invasive Test Results: Equivocal test results - non-ischemic Myoview - but clear CAD on Dx cath with persistent angina  Prior CABG: No previous CABG   Glenetta Hew

## 2018-12-17 NOTE — Progress Notes (Signed)
PHARMACIST - PHYSICIAN ORDER COMMUNICATION  CONCERNING: P&T Medication Policy on Herbal Medications  DESCRIPTION:  This patient's order for: Co Q 10  has been noted.  This product(s) is classified as an "herbal" or natural product. Due to a lack of definitive safety studies or FDA approval, nonstandard manufacturing practices, plus the potential risk of unknown drug-drug interactions while on inpatient medications, the Pharmacy and Therapeutics Committee does not permit the use of "herbal" or natural products of this type within [facility].   ACTION TAKEN: The pharmacy department is unable to verify this order at this time. Please reevaluate patient's clinical condition at discharge and address if the herbal or natural product(s) should be resumed at that time.  Nicole Cella, RPh Clinical Pharmacist 12/17/2018 1:30 PM

## 2018-12-18 ENCOUNTER — Encounter (HOSPITAL_COMMUNITY): Payer: Self-pay | Admitting: Physician Assistant

## 2018-12-18 ENCOUNTER — Telehealth: Payer: Self-pay

## 2018-12-18 ENCOUNTER — Telehealth: Payer: Self-pay | Admitting: Cardiology

## 2018-12-18 DIAGNOSIS — Z8546 Personal history of malignant neoplasm of prostate: Secondary | ICD-10-CM | POA: Diagnosis not present

## 2018-12-18 DIAGNOSIS — I25118 Atherosclerotic heart disease of native coronary artery with other forms of angina pectoris: Secondary | ICD-10-CM | POA: Diagnosis not present

## 2018-12-18 DIAGNOSIS — Z9861 Coronary angioplasty status: Secondary | ICD-10-CM

## 2018-12-18 DIAGNOSIS — I10 Essential (primary) hypertension: Secondary | ICD-10-CM | POA: Diagnosis not present

## 2018-12-18 DIAGNOSIS — E785 Hyperlipidemia, unspecified: Secondary | ICD-10-CM | POA: Diagnosis not present

## 2018-12-18 DIAGNOSIS — I2510 Atherosclerotic heart disease of native coronary artery without angina pectoris: Secondary | ICD-10-CM

## 2018-12-18 DIAGNOSIS — I25119 Atherosclerotic heart disease of native coronary artery with unspecified angina pectoris: Secondary | ICD-10-CM | POA: Diagnosis not present

## 2018-12-18 DIAGNOSIS — K5730 Diverticulosis of large intestine without perforation or abscess without bleeding: Secondary | ICD-10-CM | POA: Diagnosis not present

## 2018-12-18 DIAGNOSIS — I4891 Unspecified atrial fibrillation: Secondary | ICD-10-CM | POA: Diagnosis not present

## 2018-12-18 LAB — BASIC METABOLIC PANEL
Anion gap: 5 (ref 5–15)
BUN: 14 mg/dL (ref 8–23)
CO2: 29 mmol/L (ref 22–32)
Calcium: 9.2 mg/dL (ref 8.9–10.3)
Chloride: 105 mmol/L (ref 98–111)
Creatinine, Ser: 0.86 mg/dL (ref 0.61–1.24)
GFR calc Af Amer: >60 mL/min (ref >60)
GFR calc non Af Amer: >60 mL/min (ref >60)
Glucose, Bld: 92 mg/dL (ref 70–99)
Potassium: 4 mmol/L (ref 3.5–5.1)
Sodium: 139 mmol/L (ref 135–145)

## 2018-12-18 LAB — LIPID PANEL
Cholesterol: 161 mg/dL (ref 0–200)
HDL: 38 mg/dL — ABNORMAL LOW (ref >40)
LDL Cholesterol: 107 mg/dL — ABNORMAL HIGH (ref 0–99)
Total CHOL/HDL Ratio: 4.2 RATIO
Triglycerides: 82 mg/dL (ref <150)
VLDL: 16 mg/dL (ref 0–40)

## 2018-12-18 LAB — CBC
HCT: 44.4 % (ref 39.0–52.0)
Hemoglobin: 14.4 g/dL (ref 13.0–17.0)
MCH: 29.1 pg (ref 26.0–34.0)
MCHC: 32.4 g/dL (ref 30.0–36.0)
MCV: 89.7 fL (ref 80.0–100.0)
Platelets: 167 10*3/uL (ref 150–400)
RBC: 4.95 MIL/uL (ref 4.22–5.81)
RDW: 13.4 % (ref 11.5–15.5)
WBC: 6 10*3/uL (ref 4.0–10.5)
nRBC: 0 % (ref 0.0–0.2)

## 2018-12-18 MED ORDER — LISINOPRIL 5 MG PO TABS: 5.0000 mg | ORAL_TABLET | Freq: Every day | ORAL | 3 refills | Status: DC

## 2018-12-18 MED ORDER — LISINOPRIL 5 MG PO TABS
5.0000 mg | ORAL_TABLET | Freq: Every day | ORAL | Status: DC
  Administered 2018-12-18: 5 mg via ORAL
  Filled 2018-12-18: qty 1

## 2018-12-18 NOTE — Telephone Encounter (Signed)
Returned call to pt he states that he has 2 sets of directions in his d/c instructions one from Dr Ellyn Hack and one from Dr Gwenlyn Found. He states that Dr Ellyn Hack was first and Dr Gwenlyn Found was 2nd. He will stop ranexa and amlodipine and take lisinopril 5mg  daily. But this is confusing to him. He states that he was on lisinopril 10mg  before he had his Cath. He will cut in 1/2 and take the lisinopril 5mg  daily.  Please review and let him know what you would like him to take. He will take his BP BID to see how it is doing on the lisinopril 5mg  daily. Please advise

## 2018-12-18 NOTE — Progress Notes (Signed)
CARDIAC REHAB PHASE I   PRE:  Rate/Rhythm: 73 SR  BP:  Supine:   Sitting: 144/89  Standing:    SaO2:   MODE:  Ambulation: 470 ft   POST:  Rate/Rhythm: 93 SR  BP:  Supine: 185/81, 150/83, 133/86  Sitting:   Standing:    SaO2: 98%RA 0805-0910 Pt walked 470 ft on RA with steady gait and no CP. Tolerated well. Education completed with pt who voiced understanding. Discussed heart healthy food choices, walking for ex, plavix for stent, and CRP 2. Will refer to [facility] program. Pt will be moving to [place] the end of OCT so may only be available for few weeks of program.  Pt is interested in participating in Virtual Cardiac and Pulmonary Rehab. Pt advised that Virtual Cardiac and Pulmonary Rehab is provided at no cost to the patient.  Checklist:  1. Pt has smart device  ie smartphone and/or ipad for downloading an app  Yes 2. Reliable internet/wifi service    Yes 3. Understands how to use their smartphone and navigate within an app.  Yes   Pt verbalized understanding and is in agreement.    Graylon Good, RN BSN  12/18/2018 9:05 AM

## 2018-12-18 NOTE — Telephone Encounter (Signed)
Still admitted  12/17/2018 - present (1 day) [place] F/u appt with Kerin Ransom, PA-C 01/04/2019 10:00 AM

## 2018-12-18 NOTE — Telephone Encounter (Signed)
New message   Patient has a question about his medication that he is being put on since being discharged from the hospital. Please call to discuss.

## 2018-12-18 NOTE — Discharge Summary (Addendum)
Discharge Summary    Patient ID: Ian [name] MRN: [id number]; DOB: 21-Feb-1946  Admit date: 12/17/2018 Discharge date: 12/18/2018  Primary Care Provider: Lajean Manes, MD  Primary Cardiologist: Glenetta Hew, MD  Primary Electrophysiologist:  None   Discharge Diagnoses    Principal Problem:   Coronary artery disease involving native coronary artery of native heart with angina pectoris [facility]) Active Problems:   CAD S/P percutaneous coronary angioplasty   History of atrial fibrillation without current medication   Malignant neoplasm of prostate [facility])   Essential hypertension   Atypical angina ([place])   Allergies Allergies  Allergen Reactions  . Isosorbide Other (See Comments)     Dropped blood pressure , Develop severe headache   . Statins   . Adhesive [Tape] Rash and Other (See Comments)    Band aid adhesive ---- RASH Skin peels off  . Codeine Nausea And Vomiting  . Demerol [Meperidine] Nausea And Vomiting  . Lescol [Fluvastatin Sodium] Other (See Comments)    Muscle pain   . Morphine And Related Nausea And Vomiting  . Oxycodone Nausea And Vomiting  . Penicillins Nausea And Vomiting and Other (See Comments)    Has patient had a PCN reaction causing immediate rash, facial/tongue/throat swelling, SOB or lightheadedness with hypotension: No Has patient had a PCN reaction causing severe rash involving mucus membranes or skin necrosis: No Has patient had a PCN reaction that required hospitalization No Has patient had a PCN reaction occurring within the last 10 years: No If all of the above answers are "NO", then may proceed with Cephalosporin use.   Christy Sartorius Sodium] Other (See Comments)    Muscle pain   . Welchol [Colesevelam Hcl] Other (See Comments)    Weakness   . Zetia [Ezetimibe] Other (See Comments)    Muscle pain     Diagnostic Studies/Procedures     LESION #1: Lat 1st Mrg lesion is 80% stenosed.  Scoring balloon angioplasty was performed  using a BALLOON WOLVERINE 2.00X6.  Post intervention, there is a 15% residual stenosis.  1st Mrg lesion is 30% stenosed.  ---------------------------  LESION #2: mid LAD to Dist LAD long lesion is 60% stenosed (with a focal area of probably 70%). DFR positive  A drug-eluting stent was successfully placed using a STENT RESOLUTE ONYX 2.25X34. -Postdilated to 2.6 mm  Post intervention, there is a 0% residual stenosis.  ------------------------------  1st Diag-1 lesion is 50% stenosed. Previously placed 1st Diag-2 stent (unknown type) is widely patent. 1st Diag-3 lesion is 70% stenosed with 70% stenosed side branch in Lat 1st Diag.   SUMMARY  Successful DES PCI of the mid LAD (resolute Onyx DES 2.25 mm x 34 mm--2.6 mm)  Successful scoring balloon PTCA ostial daughter branch of OM1 -reducing 85% lesion to 20%  Stable Diagonal disease, but likely too small for successful PTCA based on the size of the LAD after stenting.   RECOMMENDATIONS  Monitor overnight after PTCA only and extensive LAD stenting.  Anticipate discharge tomorrow  Continue current medical management with Ranexa and amlodipine  For now the plan will be to medically manage the diagonal branch as there does not appear to be good PCI option without a very long small stent (~2 mm) that would jail a sidebranch  Patient is already been loaded with Plavix and has been on aspirin.   History of Present Illness     Ian [name] is a 73 yo male with a history of nonischemic myoview who went for left heart  cath 11/17/18 for ongoing chest pain. LHC on 11/17/18 with 55% stenosis of the LAD that was DFR positive (0.88 - 0.92 - 0.95) with plans to treat medically. He had 70% stenosis in D1 after previously placed stent and 80% stenosis in the ostial lateral OM1, normal RCA. Medical therapy was optimized with amlodipine and ranexa in anticipation for upcoming prostate surgery. Unfortunately, he continued to have ongoing chest pain and his  surgery was deferred by urology. He returned to [facility] 12/17/18 for coronary intervention.     Hospital Course     Consultants: none  CAD He was taken to the cath lab on 12/17/18. Successful scoring balloon to M1 and DES to LAD.  All other disease stable. He tolerated the procedure well. Right radial cath site C/D/I without hematoma. Continue home DAPT ASA and plavix for at least 12 months, potentially longer given residual disease. BB not initiated due to baseline bradycardia (50-60s). ACEI had been held for initiation of amlodipine for anti-anginal effects.  Now that he is chest pain free and revascularized, will D/C 2.5 mg amlodipine and reinstate low dose lisinopril. Will also D/C ranexa.     Hypertension Has been hypertensive here. He states he was unable to tolerate lisinopril in addition to amlodipine. He generally runs sBP in the 120s at home. Will add back 5 mg lisinopril. D/C'ed amlodipine. Will likely need to titrate back up to home dose of 10 mg lisinopril OP.    Hyperlipidemia Lipid profile pending. Allergies to statins and zetia. May need referral to lipid clinic for PCSK9 inhibitor.    He is moving to [place] in October and will transfer cardiology care at that time.  Recommend Dr. Alla German in [facility] (per Dr. Gwenlyn Found).  Pt seen and examined by Dr. Gwenlyn Found and deemed stable for discharge. I will arrange cardiology follow up.     _____________  Discharge Vitals Blood pressure (!) 145/87, pulse 61, temperature 98 F (36.7 C), temperature source Oral, resp. rate 16, height 5\' 11"  (1.803 m), weight 82.4 kg, SpO2 98 %.  Filed Weights   12/17/18 0531 12/18/18 0457  Weight: 82.6 kg 82.4 kg    Physical Exam  Constitutional: He is oriented to person, place, and time. He appears well-developed and well-nourished.  HENT:  Head: Normocephalic and atraumatic.  Eyes: Pupils are equal, round, and reactive to light.  Neck: No JVD present.  Cardiovascular: Normal rate, regular rhythm and  normal heart sounds.  Pulmonary/Chest: Effort normal and breath sounds normal.  Abdominal: Soft. Bowel sounds are normal.  Musculoskeletal:        General: No edema.  Neurological: He is alert and oriented to person, place, and time.  Skin: Skin is warm and dry.  Psychiatric: He has a normal mood and affect.   Right radial cath site C/D/I, no hematoma. Covered.   Labs & Radiologic Studies    CBC Recent Labs    12/18/18 0430  WBC 6.0  HGB 14.4  HCT 44.4  MCV 89.7  PLT [id number]   Basic Metabolic Panel Recent Labs    12/18/18 0430  NA 139  K 4.0  CL 105  CO2 29  GLUCOSE 92  BUN 14  CREATININE 0.86  CALCIUM 9.2   Liver Function Tests No results for input(s): AST, ALT, ALKPHOS, BILITOT, PROT, ALBUMIN in the last 72 hours. No results for input(s): LIPASE, AMYLASE in the last 72 hours. High Sensitivity Troponin:   No results for input(s): TROPONINIHS in the last 720 hours.  BNP Invalid input(s): POCBNP D-Dimer No results for input(s): DDIMER in the last 72 hours. Hemoglobin A1C No results for input(s): HGBA1C in the last 72 hours. Fasting Lipid Panel Recent Labs    12/18/18 0430  CHOL 161  HDL 38*  LDLCALC 107*  TRIG 82  CHOLHDL 4.2   Thyroid Function Tests No results for input(s): TSH, T4TOTAL, T3FREE, THYROIDAB in the last 72 hours.  Invalid input(s): FREET3 _____________  Ian [name]  Result Date: 12/02/2018 CLINICAL DATA:  Small volume Gleason 3+3=6 prostate cancer shown on biopsy of 08/13/2018 in the left lateral apex, left base, and right mid gland. Elevated PSA level of 2.37 on 08/06/2028. EXAM: Ian PROSTATE WITHOUT AND WITH [name] TECHNIQUE: Multiplanar multisequence MRI images were obtained of the pelvis centered about the prostate. Pre and post [name] images were obtained. [name]:  40mL MULTIHANCE GADOBENATE DIMEGLUMINE 529 MG/ML IV SOLN COMPARISON:  CT pelvis 07/02/2011 FINDINGS: Prostate: Accentuated T1 signal scattered in much of  the peripheral zone of the prostate gland including the base, mid gland, and apex as shown on images 10 through 15 of series 7, compatible with blood products from prior biopsy. Moreover there is diffuse and relatively symmetric enhancement of the peripheral zone without focal peripheral zone disproportionate enhancement identified. There some reduced T2 signal in the left posterolateral peripheral zone prostate base but this appears to fairly well correspond with the accentuated T1 signal in this vicinity, for example on image 38/9. Similarly there is some patchy reduced T2 and ADC map activity in the left posterolateral and anterolateral mid gland to apex for example on image 45/9, but this likewise corresponds to accentuated T1 signal. A midline cystic lesion along the posterosuperior prostate gland is present and measures 1.1 cm in diameter on image 35/9. One sometimes useful technique in the setting of high T1 signal in the peripheral zone is to assess for area is lacking high T1 this so-called "T1 exclusion sign" can sometimes indicate local tumor. However, the small patchy areas of the peripheral zone without accentuated T1 signal do not seem to correspond to definite regions of low ADC map activity or focal enhancement. There is some asymmetry of the seminal vesicles, with the left seminal vesicle containing accentuated T1 signal and the right seminal vesicles potentially containing fluid-fluid levels and/or debris. Aside from findings of benign prostate actor parotid fee common no transition zone lesions are identified. Volume: 3D volumetric analysis: 32.87 cubic cm (4.7 by 4.0 by 4.2 cm). Transcapsular spread:  Absent Seminal vesicle involvement: Absent Neurovascular bundle involvement: Absent Pelvic adenopathy: Absent Bone metastasis: Absent Other findings: Mild sigmoid colon diverticulosis. Lumbar and sacral pedicle screw fixation. IMPRESSION: 1. No discrete lesion of moderate or high suspicion in the  prostate gland is identified by MRI. There is fairly diffuse accentuated T1 signal throughout the peripheral zone, probably representing blood products from prior biopsy, along with early in diffuse peripheral zone enhancement. No suspicious lesions along areas of T1 exclusion in the peripheral zone. No worrisome transition zone lesion is identified. Because no lesion of intermediate or high suspicion is identified by MRI, targeting data was not sent to [place]. 2. Mild benign prostatic hypertrophy. Prostate volume 32.87 cubic cm. 3. A 1.1 cm midline cystic lesion along the posterosuperior prostate gland is present, differential diagnostic considerations include prosthetic utricle cyst, Mullerian duct cyst, or ductus deferens/ejaculatory duct cyst. 4. Accentuated T1 signal in the left seminal vesicle along with some debris and fluid-levels in the right seminal vesicle, probably  representing proteinaceous or blood products. 5. Mild sigmoid colon diverticulosis. Electronically Signed   By: Van Clines M.D.   On: 12/02/2018 12:33   Disposition   Pt is being discharged home today in good condition.  Follow-up Plans & Appointments    Follow-up Information    Erlene Quan, PA-C Follow up on 01/04/2019.   Specialties: Cardiology, Radiology Why: 10:00 AM Contact information: [address] [place] [address] [phone]          Discharge Instructions    Amb Referral to Cardiac Rehabilitation   Complete by: As directed    Diagnosis:  Coronary Stents PTCA     After initial evaluation and assessments completed: Virtual Based Care may be provided alone or in conjunction with Phase 2 Cardiac Rehab based on patient barriers.: Yes   Diet - low sodium heart healthy   Complete by: As directed    Discharge instructions   Complete by: As directed    No driving for 1 week. No lifting over 5 lbs for 1 week. No sexual activity for 1 week. Keep procedure site clean & dry. If you  notice increased pain, swelling, bleeding or pus, call/return!  You may shower, but no soaking baths/hot tubs/pools for 1 week.   Increase activity slowly   Complete by: As directed       Discharge Medications   Allergies as of 12/18/2018      Reactions   Isosorbide Other (See Comments)    Dropped blood pressure , Develop severe headache    Statins    Adhesive [tape] Rash, Other (See Comments)   Band aid adhesive ---- RASH Skin peels off   Codeine Nausea And Vomiting   Demerol [meperidine] Nausea And Vomiting   Lescol [fluvastatin Sodium] Other (See Comments)   Muscle pain   Morphine And Related Nausea And Vomiting   Oxycodone Nausea And Vomiting   Penicillins Nausea And Vomiting, Other (See Comments)   Has patient had a PCN reaction causing immediate rash, facial/tongue/throat swelling, SOB or lightheadedness with hypotension: No Has patient had a PCN reaction causing severe rash involving mucus membranes or skin necrosis: No Has patient had a PCN reaction that required hospitalization No Has patient had a PCN reaction occurring within the last 10 years: No If all of the above answers are "NO", then may proceed with Cephalosporin use.   Pravachol [pravastatin Sodium] Other (See Comments)   Muscle pain   Welchol [colesevelam Hcl] Other (See Comments)   Weakness   Zetia [ezetimibe] Other (See Comments)   Muscle pain      Medication List    STOP taking these medications   amLODipine 2.5 MG tablet Commonly known as: NORVASC   ranolazine 500 MG 12 hr tablet Commonly known as: Ranexa     TAKE these medications   aspirin EC 81 MG tablet Take 81 mg by mouth at bedtime.   Calcium 600+D 600-800 MG-UNIT Tabs Generic drug: Calcium Carb-Cholecalciferol Take 1 tablet by mouth 2 (two) times daily.   cetirizine 10 MG tablet Commonly known as: ZYRTEC Take 10 mg by mouth at bedtime.   clopidogrel 75 MG tablet Commonly known as: PLAVIX Take 1 tablet (75 mg total) by mouth  daily.   CoQ-10 400 MG Caps Take 400 mg by mouth daily.   gabapentin 100 MG capsule Commonly known as: NEURONTIN Take 300 mg by mouth 3 (three) times daily as needed (severe pain.).   GLUCOSAMINE-MSM PO Take 2 tablets by mouth daily.  HYDROcodone-acetaminophen 5-325 MG tablet Commonly known as: NORCO/VICODIN Take 1 tablet by mouth every 6 (six) hours as needed (pain.).   lisinopril 5 MG tablet Commonly known as: ZESTRIL Take 1 tablet (5 mg total) by mouth daily.   magnesium hydroxide 400 MG/5ML suspension Commonly known as: MILK OF MAGNESIA Take 15-30 mLs by mouth daily as needed for mild constipation.   methocarbamol 500 MG tablet Commonly known as: ROBAXIN Take 1 tablet (500 mg total) by mouth every 6 (six) hours as needed for muscle spasms.   nitroGLYCERIN 0.4 MG SL tablet Commonly known as: NITROSTAT Place 1 tablet (0.4 mg total) under the tongue every 5 (five) minutes as needed for chest pain.   Ocuvite Adult 50+ Caps Take 1 capsule by mouth daily.   pantoprazole 40 MG tablet Commonly known as: PROTONIX Take 1 tablet (40 mg total) by mouth daily.   riboflavin 100 MG Tabs tablet Commonly known as: VITAMIN B-2 Take 400 mg by mouth daily.   sildenafil 50 MG tablet Commonly known as: VIAGRA Take 50 mg by mouth daily as needed for erectile dysfunction.   Tart Cherry Advanced Caps Take 2 capsules by mouth daily.   vitamin C 500 MG tablet Commonly known as: ASCORBIC ACID Take 1,000 mg by mouth daily.   Xiidra 5 % Soln Generic drug: Lifitegrast Place 1 drop into both eyes 2 (two) times daily as needed for dry eyes.        Acute coronary syndrome (MI, NSTEMI, STEMI, etc) this admission?: No.    Outstanding Labs/Studies   Lipid clinic referral  Duration of Discharge Encounter   Greater than 30 minutes including physician time.  Signed, Ledora Bottcher, PA 12/18/2018, 9:39 AM   Agree with note by Fabian Sharp PA-C  Ian. Mary [name] is a patient Dr.  Allison Quarry.  He had prior diagonal branch stenting.  He was admitted with accelerated angina.  He had prior cath revealing moderate LAD disease treated medically no suspicion of prostate surgery.  Because of progressive chest pain it was elected to postpone prostate surgery and proceed with coronary intervention.  He had Cutting Balloon atherectomy of the ostium of first diagonal branch, PCI and stenting of long segmental mid LAD lesion after DFR showed this to be hemodynamically significant.  He is pain-free this morning.  His right radial puncture site is stable.  His medicines be adjusted.  Plans to discontinue ranolazine and low-dose amlodipine, restart lisinopril.  We did have a discussion about PCSK9.  He anticipates moving to [place] in [place].  I have given him the name of Dr. Alla German in [facility] for cardiology follow-up.  Lorretta Harp, M.D., [place], [facility], [place] [address] [place], [place]  [id number]  [phone] 12/18/2018 9:40 AM

## 2018-12-18 NOTE — Telephone Encounter (Signed)
-----   Message from Ledora Bottcher, [place] sent at 12/18/2018  7:51 AM EDT ----- Please arrange TOC appt in 7-10 days with an APP on Dr. Allison Quarry team.   Thanks Angie

## 2018-12-19 NOTE — Telephone Encounter (Signed)
Continue with my plan.  Prien

## 2018-12-22 ENCOUNTER — Telehealth (HOSPITAL_COMMUNITY): Payer: Self-pay

## 2018-12-22 ENCOUNTER — Other Ambulatory Visit: Payer: Self-pay | Admitting: Geriatric Medicine

## 2018-12-22 DIAGNOSIS — R1314 Dysphagia, pharyngoesophageal phase: Secondary | ICD-10-CM

## 2018-12-22 NOTE — Telephone Encounter (Signed)
Left detailed message for pt to CB if anything else is needed

## 2018-12-22 NOTE — Telephone Encounter (Signed)
Pt insurance is active and benefits verified through Medicare a/b Co-pay 0, DED $198/$198 met, out of pocket 0/0 met, co-insurance 20%. no pre-authorization required. Passport, 12/22/2018_0 :43am, HGD#[id number]  Will contact patient to see if he is interested in the Cardiac Rehab Program. If interested, patient will need to complete follow up appt. Once completed, patient will be contacted for scheduling upon review by the RN Navigator.

## 2018-12-22 NOTE — Telephone Encounter (Signed)
LM2CB 12/17/2018 - 12/18/2018 (29 hours) [place]

## 2018-12-22 NOTE — Telephone Encounter (Signed)
Returned call to pt he states that he is having chest pressure and Jaw pain but he thinks that this is related to his GERD. He states that he has a call into [facility] to see if this is his esophagus/GERD issues. He states that her BP is fine 124/56 HR 74. He states that Dr Ellyn Hack had told him in the past to follow thru with GI and let him know what is happening with them and CB if still having sx. He will CB with GI updates.

## 2018-12-22 NOTE — Telephone Encounter (Signed)
Pt called back, informed of Dr Allison Quarry message (12-19-18:Harding, Leonie Green, MD to Me  @ 5:51 PM  Note: Continue with my plan.-DH) he states "that's what I wanted to know"

## 2018-12-22 NOTE — Telephone Encounter (Signed)
noted 

## 2018-12-23 ENCOUNTER — Other Ambulatory Visit: Payer: Medicare Other

## 2018-12-25 ENCOUNTER — Ambulatory Visit
Admission: RE | Admit: 2018-12-25 | Discharge: 2018-12-25 | Disposition: A | Discharge status: Home / Self Care | Payer: Medicare Other | Source: Ambulatory Visit | Attending: Geriatric Medicine | Admitting: Geriatric Medicine

## 2018-12-25 DIAGNOSIS — K219 Gastro-esophageal reflux disease without esophagitis: Secondary | ICD-10-CM | POA: Diagnosis not present

## 2018-12-25 DIAGNOSIS — R1314 Dysphagia, pharyngoesophageal phase: Secondary | ICD-10-CM

## 2018-12-25 DIAGNOSIS — K224 Dyskinesia of esophagus: Secondary | ICD-10-CM | POA: Diagnosis not present

## 2019-01-04 ENCOUNTER — Ambulatory Visit (INDEPENDENT_AMBULATORY_CARE_PROVIDER_SITE_OTHER): Payer: Medicare Other | Admitting: Cardiology

## 2019-01-04 ENCOUNTER — Other Ambulatory Visit: Payer: Self-pay

## 2019-01-04 ENCOUNTER — Telehealth: Payer: Self-pay

## 2019-01-04 ENCOUNTER — Encounter: Payer: Self-pay | Admitting: Cardiology

## 2019-01-04 VITALS — BP 136/82 | HR 63 | Ht 70.5 in | Wt 189.0 lb

## 2019-01-04 DIAGNOSIS — I208 Other forms of angina pectoris: Secondary | ICD-10-CM

## 2019-01-04 DIAGNOSIS — I2510 Atherosclerotic heart disease of native coronary artery without angina pectoris: Secondary | ICD-10-CM

## 2019-01-04 DIAGNOSIS — Z9861 Coronary angioplasty status: Secondary | ICD-10-CM

## 2019-01-04 DIAGNOSIS — E785 Hyperlipidemia, unspecified: Secondary | ICD-10-CM | POA: Diagnosis not present

## 2019-01-04 DIAGNOSIS — Z8679 Personal history of other diseases of the circulatory system: Secondary | ICD-10-CM | POA: Diagnosis not present

## 2019-01-04 DIAGNOSIS — K219 Gastro-esophageal reflux disease without esophagitis: Secondary | ICD-10-CM

## 2019-01-04 DIAGNOSIS — C61 Malignant neoplasm of prostate: Secondary | ICD-10-CM

## 2019-01-04 DIAGNOSIS — I10 Essential (primary) hypertension: Secondary | ICD-10-CM | POA: Diagnosis not present

## 2019-01-04 MED ORDER — ESOMEPRAZOLE MAGNESIUM 40 MG PO CPDR: 40.0000 mg | DELAYED_RELEASE_CAPSULE | Freq: Every day | ORAL | 2 refills | Status: AC

## 2019-01-04 NOTE — Assessment & Plan Note (Signed)
Followed by Dr Gloriann Loan- plan surgery after 12 mos of Plavix.

## 2019-01-04 NOTE — Progress Notes (Signed)
Cardiology Office Note:    Date:  01/04/2019   ID:  Ian [name], DOB 1945/07/29, MRN [id number]  PCP:  Lajean Manes, MD  Cardiologist:  Glenetta Hew, MD  Electrophysiologist:  None   Referring MD: Lajean Manes, MD   Chief Complaint  Patient presents with  . Follow-up    History of Present Illness:    Ian [name] is a 73 y.o. male with a hx of CAD, s/p PCI of Dx1 in 2004 (Dr Leonia Reeves).  He used to be the Radio producer at [employer].  Cath in 2013 showed patent Dx1 stent.  Other medical problems include a history of prostate CA followed by Dr Gloriann Loan, GERD followed at [facility], and statin intolerance. He saw Dr Ellyn Hack in July and complained of SSCP.  He described mid sternal- right sided chest pressure and "tightness".  It radiates to his jaw and between his shoulder blades.  He underwent Myoview which was abnormal.  Cath was done 11/17/2018 and the plan was for medical Rx.  The patient continued to have symptoms and was admitted for elective PCI 12/17/2018 to mLAD with DES and POBA to OM1.  He tolerated this well and tells me some of his symptoms improved but her continued to complain of epigastric discomfort. He admitted to me that he had previously weaned himself off PPIs.  BA swallow done 12/25/2018 showed GERD bu no stricture.  He is now on Protonix Q AM and Pepcid Q PM.  His symptoms of GERD are better but the patient thinks Nexium worked better.     Past Medical History:  Diagnosis Date  . Arthritis   . Atherosclerosis of aorta ([place])   . CAD S/P percutaneous coronary angioplasty 2004, 2020   06-28-2002  Cardiac Cath subtotal TO of D1 -- staged PCI with Taxus DES 2.5 x 12; Cath (abn Nuc ST) 08/2011: 30% pLAD & pD1 with patent D1 stent.  EF ~45%.; Cath 11/17/2018: long ~50-60% m-dLAD, 70% pre & post stent in D1, Ostial lat OM1 80% -- failed Med Rx; LHC 12/17/18 with balloon and DES to LAD  . Diverticulosis   . GERD (gastroesophageal reflux disease)   . History of atrial fibrillation  without current medication    one episode in 2011-- per cardiologist note has not had any other issues  . History of basal cell carcinoma (BCC) excision    x2  forehead  . History of kidney stones   . History of melanoma excision    scalp s/p moh's  . HTN (hypertension)   . Hypercholesterolemia   . Lung nodule    rt lower lobe  . Prostate cancer ([place])   . Renal cyst   . S/P drug eluting coronary stent placement 06/28/2002, 12/17/18   x1  to D1, 12/17/18 DES to LAD  . Varicose veins    wear teds  . Wears glasses   . Wears hearing aid in both ears     Past Surgical History:  Procedure Laterality Date  . BRACHIAL NERVE REPAIR Left 2007   approx.   RELEASE  . BUNIONECTOMY Right 2008   approx.   and Hammer toe correction  . CARPAL TUNNEL RELEASE Right 2018  . CERVICAL FUSION  ~2008;   07-21-2009   dr Carloyn Manner   posterior C2-3 2008 with screws;   cage fusion C2-3 07-21-2009  . CERVICAL GANGLIONECTOMY  1995   C2  posterior  . CORONARY ANGIOPLASTY  06-28-2002   dr Leonia Reeves  @[facility]   PCI and DES x1  to Diagonal 1  . CORONARY STENT INTERVENTION N/A 12/17/2018   Procedure: CORONARY STENT INTERVENTION;  Surgeon: Leonie Man, MD;  Location: [place] CV [facility];  Service: Cardiovascular;  Laterality: N/A;  . INGUINAL HERNIA REPAIR Left 08/08/2016   Procedure: OPEN LEFT INGUINAL HERNIA REPAIR WITH MESH;  Surgeon: Armandina Gemma, MD;  Location: [facility];  Service: General;  Laterality: Left;  . INSERTION OF MESH Left 08/08/2016   Procedure: INSERTION OF MESH;  Surgeon: Armandina Gemma, MD;  Location: [facility];  Service: General;  Laterality: Left;  . INTRAVASCULAR PRESSURE WIRE/FFR STUDY N/A 11/17/2018   Procedure: INTRAVASCULAR PRESSURE WIRE/FFR STUDY;  Surgeon: Leonie Man, MD;  Location: [place] [facility];  Service: Cardiovascular;  Laterality: N/A;  . LEFT HEART CATH AND CORONARY ANGIOGRAPHY N/A 11/17/2018   Procedure: LEFT HEART CATH AND CORONARY ANGIOGRAPHY;  Surgeon: Leonie Man, MD;  Location: [facility]  INVASIVE CV LAB;;; EF 40 to 45% global HK.  Mid-distal LAD extensive 50 to 60% stenosis, calcified.  DFR positive distally (initial plans for medical management).  D1 stent patent with 70% stenosis post stent, ostial lateral OM1 80% (smaller to OM branches) --> initial plans =Med Rx  . LEFT HEART CATHETERIZATION WITH CORONARY ANGIOGRAM N/A 11/07/2011   Procedure: LEFT HEART CATHETERIZATION WITH CORONARY ANGIOGRAM;  Surgeon: Candee Furbish, MD;  Location: [facility];  Service: Cardiovascular: - Equivocal persistent symptoms despite nonischemic Myoview. EF ~45%. 30% calcified pLAD, 30% D1 prestent. Widely paent D1 DES stent.  . [place], 2010, 2011   Total 3 surgeries  . POSTERIOR LUMBAR FUSION  07-17 & 21-- 2019  dr Carloyn Manner  @ [facility] in [place]   staged  T10 -- S1  . REVERSE SHOULDER ARTHROPLASTY Right 11/14/2017  . REVERSE SHOULDER ARTHROPLASTY Right 11/13/2017   Procedure: REVERSE RIGHT SHOULDER ARTHROPLASTY;  Surgeon: Justice Britain, MD;  Location: [place];  Service: Orthopedics;  Laterality: Right;  . TRIGGER FINGER RELEASE Right 09-26-2005   dr sypher  @[facility]    right ring finger    Current Medications: Current Meds  Medication Sig  . amLODipine (NORVASC) 2.5 MG tablet Take 2.5 mg by mouth daily.  Marland Kitchen aspirin EC 81 MG tablet Take 81 mg by mouth at bedtime.   . Calcium Carb-Cholecalciferol (CALCIUM 600+D) 600-800 MG-UNIT TABS Take 1 tablet by mouth 2 (two) times daily.  . cetirizine (ZYRTEC) 10 MG tablet Take 10 mg by mouth at bedtime.   . clopidogrel (PLAVIX) 75 MG tablet Take 1 tablet (75 mg total) by mouth daily.  . Coenzyme Q10 (COQ-10) 400 MG CAPS Take 400 mg by mouth daily.  Marland Kitchen gabapentin (NEURONTIN) 100 MG capsule Take 300 mg by mouth 3 (three) times daily as needed (severe pain.).   Marland Kitchen Glucosamine HCl-MSM (GLUCOSAMINE-MSM PO) Take 2 tablets by mouth daily.   Marland Kitchen HYDROcodone-acetaminophen (NORCO/VICODIN) 5-325 MG tablet Take 1 tablet by mouth every 6 (six) hours as needed (pain.).  Marland Kitchen  magnesium hydroxide (MILK OF MAGNESIA) 400 MG/5ML suspension Take 15-30 mLs by mouth daily as needed for mild constipation.   . methocarbamol (ROBAXIN) 500 MG tablet Take 1 tablet (500 mg total) by mouth every 6 (six) hours as needed for muscle spasms.  . Misc Natural Products (TART CHERRY ADVANCED) CAPS Take 2 capsules by mouth daily.   . Multiple Vitamins-Minerals (OCUVITE ADULT 50+) CAPS Take 1 capsule by mouth daily.  . nitroGLYCERIN (NITROSTAT) 0.4 MG SL tablet Place 1 tablet (0.4 mg total)  under the tongue every 5 (five) minutes as needed for chest pain.  . pantoprazole (PROTONIX) 40 MG tablet Take 1 tablet (40 mg total) by mouth daily.  . ranolazine (RANEXA) 500 MG 12 hr tablet Take 500 mg by mouth 2 (two) times daily.  . riboflavin (VITAMIN B-2) 100 MG TABS tablet Take 400 mg by mouth daily.  . sildenafil (VIAGRA) 50 MG tablet Take 50 mg by mouth daily as needed for erectile dysfunction.  . vitamin C (ASCORBIC ACID) 500 MG tablet Take 1,000 mg by mouth daily.   Marland Kitchen XIIDRA 5 % SOLN Place 1 drop into both eyes 2 (two) times daily as needed for dry eyes.     Allergies:   Isosorbide, Statins, Adhesive [tape], Codeine, Demerol [meperidine], Lescol [fluvastatin sodium], Morphine and related, Oxycodone, Penicillins, Pravachol [pravastatin sodium], Welchol [colesevelam hcl], and Zetia [ezetimibe]   Social History   Socioeconomic History  . Marital status: Married    Spouse name: Not on file  . Number of children: 2  . Years of education: 21  . Highest education level: Not on file  Occupational History  . Occupation: Orthoptist    Comment: Works for [employer] - plans to retire the end of June 2018.  Social Needs  . Financial resource strain: Not on file  . Food insecurity    Worry: Not on file    Inability: Not on file  . Transportation needs    Medical: Not on file    Non-medical: Not on file  Tobacco Use  . Smoking status: Never Smoker  . Smokeless tobacco: Never  Used  Substance and Sexual Activity  . Alcohol use: No  . Drug use: No  . Sexual activity: Yes  Lifestyle  . Physical activity    Days per week: Not on file    Minutes per session: Not on file  . Stress: Not on file  Relationships  . Social Herbalist on phone: Not on file    Gets together: Not on file    Attends religious service: Not on file    Active member of club or organization: Not on file    Attends meetings of clubs or organizations: Not on file    Relationship status: Not on file  Other Topics Concern  . Not on file  Social History Narrative   He is a married father of 2, grandfather of 33. He currently lives with his wife. 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 He is active, but does not do routine exercise. He does most yard work and is able to walk in the stores etc.     Family History: The patient's family history includes Cancer in his sister; Cancer - Prostate in his brother; Cervical cancer in his maternal grandmother; Coronary artery disease in his father; Dementia in his mother; Esophageal cancer in his father; Heart attack (age of onset: 9) in his maternal grandfather; Hypertension in his mother; Prostate cancer in his brother, father, and paternal uncle.  ROS:   Please see the history of present illness.     All other systems reviewed and are negative.  EKGs/Labs/Other Studies Reviewed:    The following studies were reviewed today: Cath/ PCI 12/17/2018  EKG:  EKG is not ordered today.    Recent Labs: 12/18/2018: BUN 14; Creatinine, Ser 0.86; Hemoglobin 14.4; Platelets 167; Potassium 4.0; Sodium 139  Recent Lipid Panel    Component Value Date/Time  CHOL 161 12/18/2018 0430   TRIG 82 12/18/2018 0430   HDL 38 (L) 12/18/2018 0430   CHOLHDL 4.2 12/18/2018 0430   VLDL 16 12/18/2018 0430   LDLCALC 107 (H) 12/18/2018 0430    Physical Exam:    VS:  BP 136/82   Pulse 63   Ht 5' 10.5" (1.791 m)   Wt 189 lb  (85.7 kg)   BMI 26.74 kg/m     Wt Readings from Last 3 Encounters:  01/04/19 189 lb (85.7 kg)  12/18/18 181 lb 10.5 oz (82.4 kg)  12/14/18 188 lb (85.3 kg)     GEN:  Well nourished, well developed in no acute distress HEENT: Normal NECK: No JVD; No carotid bruits LYMPHATICS: No lymphadenopathy CARDIAC: RRR, no murmurs, rubs, gallops RESPIRATORY:  Clear to auscultation without rales, wheezing or rhonchi  ABDOMEN: Soft, non-tender, non-distended MUSCULOSKELETAL:  No edema; No deformity  SKIN: Warm and dry NEUROLOGIC:  Alert and oriented x 3 PSYCHIATRIC:  Normal affect   ASSESSMENT:    CAD S/P multiple PCI Diagonal stent 2004.  Cath 11/07/11 - patent stent, minimal stenosis of prox LAD. DOE with abnormal Myoview July 2020- Cath 11/17/2018- attempted medical Rx (failed). PCI with DES to mLAD and POBA to OM1  12/17/2018  GERD (gastroesophageal reflux disease) Ba swallow Sept 2020  Essential hypertension Controlled  Dyslipidemia, goal LDL below 70 Statin intolerance to pharmacy for PCSK9  History of atrial fibrillation without current medication One episode 2011- not on anticoagulation  Malignant neoplasm of prostate ([place]) Followed by Dr Gloriann Loan- plan surgery after 12 mos of Plavix.  PLAN:    Discussed with Dr Horatio Pel to switch to Nexium. The patient is moving to [place] in [place].  He [place] be living [place].  Dr Gwenlyn Found had suggested Dr Tami Ribas as a cardiologist near him and we will send records there.    Medication Adjustments/Labs and Tests Ordered: Current medicines are reviewed at length with the patient today.  Concerns regarding medicines are outlined above.  No orders of the defined types were placed in this encounter.  No orders of the defined types were placed in this encounter.   Patient Instructions  Medication Instructions:  Your physician recommends that you continue on your current medications as directed. Please  refer to the Current Medication list given to you today. If you need a refill on your cardiac medications before your next appointment, please call your pharmacy.   Lab work: NONE  If you have labs (blood work) drawn today and your tests are completely normal, you will receive your results only by: Marland Kitchen MyChart Message (if you have MyChart) OR . A paper copy in the mail If you have any lab test that is abnormal or we need to change your treatment, we will call you to review the results.  Testing/Procedures: NONE   Follow-Up: At [facility], you and your health needs are our priority.  As part of our continuing mission to provide you with exceptional heart care, we have created designated Provider Care Teams.  These Care Teams include your primary Cardiologist (physician) and Advanced Practice Providers (APPs -  Physician Assistants and Nurse Practitioners) who all work together to provide you with the care you need, when you need it. Marland Kitchen PATIENT WILL BE RELOCATING TO [place]  . Your physician recommends that you schedule a follow-up appointment in: WITH PHARMD FOR PCSK9 AS SOON AS POSSIBLE  Any Other Special Instructions Will  Be Listed Below (If Applicable). WE WILL FORWARD OUR NOTES TO DR [facility] OFFICE     Signed, Kerin Ransom, PA-C  01/04/2019 10:59 AM    [facility]

## 2019-01-04 NOTE — Patient Instructions (Addendum)
Medication Instructions:  STOP Protonix START Nexium 40mg  Take 1 tablet once a day  CONTINUE Aspirin 81mg  Take 1 tablet once a day  If you need a refill on your cardiac medications before your next appointment, please call your pharmacy.   Lab work: NONE  If you have labs (blood work) drawn today and your tests are completely normal, you will receive your results only by: Marland Kitchen MyChart Message (if you have MyChart) OR . A paper copy in the mail If you have any lab test that is abnormal or we need to change your treatment, we will call you to review the results.  Testing/Procedures: NONE   Follow-Up: At [facility], you and your health needs are our priority.  As part of our continuing mission to provide you with exceptional heart care, we have created designated Provider Care Teams.  These Care Teams include your primary Cardiologist (physician) and Advanced Practice Providers (APPs -  Physician Assistants and Nurse Practitioners) who all work together to provide you with the care you need, when you need it. Marland Kitchen PATIENT WILL BE RELOCATING TO [place]  . Your physician recommends that you schedule a follow-up appointment in: WITH PHARMD FOR PCSK9 AS SOON AS POSSIBLE  Any Other Special Instructions Will Be Listed Below (If Applicable). WE WILL FORWARD OUR NOTES TO DR Roderic Ovens OFFICE

## 2019-01-04 NOTE — Telephone Encounter (Signed)
Called and spoke with pt in regards to VCR, pt stated he is moving soon and would like to wait until he meets his new cardiologist in [place]. And start CR in [place].  Closed referral

## 2019-01-04 NOTE — Assessment & Plan Note (Addendum)
One episode 2011- not on anticoagulation

## 2019-01-04 NOTE — Telephone Encounter (Signed)
Patient was seen in office today and stated he wanted to have his records sent to Dr Alla German in [place]. I called the last known office and the receptionist stated he moved to a office in [place]. I called the patient and he stated that [facility] was two hours away from where he will be living and stated he would find his own doctor and then have our office send his records there and thanked me for calling.

## 2019-01-04 NOTE — Assessment & Plan Note (Signed)
Diagonal stent 2004.  Cath 11/07/11 - patent stent, minimal stenosis of prox LAD. DOE with abnormal Myoview July 2020- Cath 11/17/2018- attempted medical Rx (failed). PCI with DES to mLAD and POBA to OM1  12/17/2018

## 2019-01-04 NOTE — Assessment & Plan Note (Signed)
Ba swallow Sept 2020

## 2019-01-04 NOTE — Assessment & Plan Note (Signed)
Controlled.  

## 2019-01-04 NOTE — Assessment & Plan Note (Signed)
Statin intolerance to pharmacy for PCSK9

## 2019-01-05 DIAGNOSIS — Z23 Encounter for immunization: Secondary | ICD-10-CM | POA: Diagnosis not present

## 2019-01-08 DIAGNOSIS — Z79899 Other long term (current) drug therapy: Secondary | ICD-10-CM | POA: Diagnosis not present

## 2019-01-08 DIAGNOSIS — K219 Gastro-esophageal reflux disease without esophagitis: Secondary | ICD-10-CM | POA: Diagnosis not present

## 2019-01-08 DIAGNOSIS — Z0000 Encounter for general adult medical examination without abnormal findings: Secondary | ICD-10-CM | POA: Diagnosis not present

## 2019-01-08 DIAGNOSIS — I700 Atherosclerosis of aorta: Secondary | ICD-10-CM | POA: Diagnosis not present

## 2019-01-08 DIAGNOSIS — E7800 Pure hypercholesterolemia, unspecified: Secondary | ICD-10-CM | POA: Diagnosis not present

## 2019-01-08 DIAGNOSIS — C61 Malignant neoplasm of prostate: Secondary | ICD-10-CM | POA: Diagnosis not present

## 2019-01-08 DIAGNOSIS — I10 Essential (primary) hypertension: Secondary | ICD-10-CM | POA: Diagnosis not present

## 2019-01-08 DIAGNOSIS — Z1389 Encounter for screening for other disorder: Secondary | ICD-10-CM | POA: Diagnosis not present

## 2019-01-11 ENCOUNTER — Ambulatory Visit: Payer: Medicare Other | Admitting: Cardiology

## 2019-01-13 ENCOUNTER — Encounter

## 2019-01-13 ENCOUNTER — Ambulatory Visit: Admit: 2019-01-13 | Payer: Medicare Other | Admitting: Urology

## 2019-01-13 SURGERY — PROSTATECTOMY, RADICAL, ROBOT-ASSISTED, LAPAROSCOPIC
Anesthesia: General

## 2019-01-21 ENCOUNTER — Ambulatory Visit (INDEPENDENT_AMBULATORY_CARE_PROVIDER_SITE_OTHER): Payer: Medicare Other | Admitting: Pharmacist Clinician (PhC)/ Clinical Pharmacy Specialist

## 2019-01-21 ENCOUNTER — Other Ambulatory Visit: Payer: Self-pay

## 2019-01-21 DIAGNOSIS — I208 Other forms of angina pectoris: Secondary | ICD-10-CM

## 2019-01-21 DIAGNOSIS — I10 Essential (primary) hypertension: Secondary | ICD-10-CM

## 2019-01-21 DIAGNOSIS — M65351 Trigger finger, right little finger: Secondary | ICD-10-CM | POA: Diagnosis not present

## 2019-01-21 DIAGNOSIS — M65322 Trigger finger, left index finger: Secondary | ICD-10-CM | POA: Diagnosis not present

## 2019-01-21 DIAGNOSIS — M65332 Trigger finger, left middle finger: Secondary | ICD-10-CM | POA: Diagnosis not present

## 2019-01-21 NOTE — Assessment & Plan Note (Signed)
Patient with known ASCVD unable to tolerate multiple statin drugs due to muscle aches and damage.  He was also unable to tolerate ezetimibe and colesevelam as well.  Will start paperwork to get patient approved for Repatha [id number] mg SureClick.  Reviewed information on mechanism, side effects and expected results.  Patient will get labs drawn after 5-6 doses.  Of note, he is in the process of moving to the [place] area.  Advised that we will follow him with labs, then he should have new cardiologist take care of any future prior authorizations needed.

## 2019-01-21 NOTE — Patient Instructions (Addendum)
We will start the process to get Repatha covered by your insurance.  This usually takes about a week.    If you think the medication will be cost prohibitive, reach out to the [employer].  ([email or web address]).  Your disease state is "hypercholesterolemia".  Answer all the questions and they will approve you for $2500 in assistance to cover your copays.   We will contact you in about 2 months and mail a lab order to re-check your cholesterol.    Evolocumab injection What is this medicine? EVOLOCUMAB (e voe LOK ue mab) is known as a PCSK9 inhibitor. It is used to lower the level of cholesterol in the blood. It may be used alone or in combination with other cholesterol-lowering drugs. This drug may also be used to reduce the risk of heart attack, stroke, and certain types of heart surgery in patients with heart disease. This medicine may be used for other purposes; ask your health care provider or pharmacist if you have questions. COMMON BRAND NAME(S): Repatha What should I tell my health care provider before I take this medicine? They need to know if you have any of these conditions:  an unusual or allergic reaction to evolocumab, other medicines, foods, dyes, or preservatives  pregnant or trying to get pregnant  breast-feeding How should I use this medicine? This medicine is for injection under the skin. You will be taught how to prepare and give this medicine. Use exactly as directed. Take your medicine at regular intervals. Do not take your medicine more often than directed. It is important that you put your used needles and syringes in a special sharps container. Do not put them in a trash can. If you do not have a sharps container, call your pharmacist or health care provider to get one. Talk to your pediatrician regarding the use of this medicine in children. While this drug may be prescribed for children as young as 13 years for selected conditions, precautions do  apply. Overdosage: If you think you have taken too much of this medicine contact a poison control center or emergency room at once. NOTE: This medicine is only for you. Do not share this medicine with others. What if I miss a dose? If you miss a dose, take it as soon as you can if there are more than 7 days until the next scheduled dose, or skip the missed dose and take the next dose according to your original schedule. Do not take double or extra doses. What may interact with this medicine? Interactions are not expected. This list may not describe all possible interactions. Give your health care provider a list of all the medicines, herbs, non-prescription drugs, or dietary supplements you use. Also tell them if you smoke, drink alcohol, or use illegal drugs. Some items may interact with your medicine. What should I watch for while using this medicine? You may need blood work while you are taking this medicine. What side effects may I notice from receiving this medicine? Side effects that you should report to your doctor or health care professional as soon as possible:  allergic reactions like skin rash, itching or hives, swelling of the face, lips, or tongue  signs and symptoms of high blood sugar such as dizziness; dry mouth; dry skin; fruity breath; nausea; stomach pain; increased hunger or thirst; increased urination  signs and symptoms of infection like fever or chills; cough; sore throat; pain or trouble passing urine Side effects that usually do not require medical attention (  report to your doctor or health care professional if they continue or are bothersome):  diarrhea  nausea  muscle pain  pain, redness, or irritation at site where injected This list may not describe all possible side effects. Call your doctor for medical advice about side effects. You may report side effects to FDA at 1-800-FDA-1088. Where should I keep my medicine? Keep out of the reach of children. You  will be instructed on how to store this medicine. Throw away any unused medicine after the expiration date on the label. NOTE: This sheet is a summary. It may not cover all possible information. If you have questions about this medicine, talk to your doctor, pharmacist, or health care provider.  2020 Elsevier/Gold Standard (2016-11-11 13:31:00)

## 2019-01-21 NOTE — Progress Notes (Signed)
01/21/2019 Ian [name] Jan 24, 1946 [id number]   HPI:  Ian [name] is a 73 y.o. male patient of Dr Ellyn Hack, who presents today for a lipid clinic evaluation.  In addition to hyperlipidemia his medical history is significant for ASCVD.  He had PCI to Dx1 in 2004, then PCI to mLAD (DES), and OM1 (balloon angioplasty) in Sept 2020.  He also has problems with GERD and is doing well with a combination of protonix and pepcid.    Current Medications: none  Cholesterol Goals:    Intolerant/previously tried: pravastatin, Lescol, Lipitor, Crestor, caused muscle pain, ezetimibe caused muscle pain, Welchol caused weakness  Family history: father and brother with cardiac issues, mother with valve problems; brother alive and doing well; mgf MI in 11's; 2 sons, doing well so far  Diet: limit to 2 meals per day; boiled egg, banana, toast with butter/peanut butter/cheese; dinner at 5-6 pm, variety of meats; some veggies; not a lot of rice/potatoes/pasta  Exercise:  Mows edges of yard with push mower; moving recently, stays on his feet  Labs: 12/2018:  TC 161, TG 82, HDL 38, LDL 107   Current Outpatient Medications  Medication Sig Dispense Refill  . amLODipine (NORVASC) 2.5 MG tablet Take 2.5 mg by mouth daily.    Marland Kitchen aspirin EC 81 MG tablet Take 81 mg by mouth at bedtime.     . Calcium Carb-Cholecalciferol (CALCIUM 600+D) 600-800 MG-UNIT TABS Take 1 tablet by mouth 2 (two) times daily.    . cetirizine (ZYRTEC) 10 MG tablet Take 10 mg by mouth at bedtime.     . clopidogrel (PLAVIX) 75 MG tablet Take 1 tablet (75 mg total) by mouth daily. 90 tablet 3  . Coenzyme Q10 (COQ-10) 400 MG CAPS Take 400 mg by mouth daily.    Marland Kitchen esomeprazole (NEXIUM) 40 MG capsule Take 1 capsule (40 mg total) by mouth daily at 12 noon. 90 capsule 2  . gabapentin (NEURONTIN) 100 MG capsule Take 300 mg by mouth 3 (three) times daily as needed (severe pain.).     Marland Kitchen Glucosamine HCl-MSM (GLUCOSAMINE-MSM PO) Take 2 tablets by mouth daily.      Marland Kitchen HYDROcodone-acetaminophen (NORCO/VICODIN) 5-325 MG tablet Take 1 tablet by mouth every 6 (six) hours as needed (pain.).    Marland Kitchen magnesium hydroxide (MILK OF MAGNESIA) 400 MG/5ML suspension Take 15-30 mLs by mouth daily as needed for mild constipation.     . methocarbamol (ROBAXIN) 500 MG tablet Take 1 tablet (500 mg total) by mouth every 6 (six) hours as needed for muscle spasms. 30 tablet 2  . Misc Natural Products (TART CHERRY ADVANCED) CAPS Take 2 capsules by mouth daily.     . Multiple Vitamins-Minerals (OCUVITE ADULT 50+) CAPS Take 1 capsule by mouth daily.    . nitroGLYCERIN (NITROSTAT) 0.4 MG SL tablet Place 1 tablet (0.4 mg total) under the tongue every 5 (five) minutes as needed for chest pain. 25 tablet 3  . ranolazine (RANEXA) 500 MG 12 hr tablet Take 500 mg by mouth 2 (two) times daily.    . riboflavin (VITAMIN B-2) 100 MG TABS tablet Take 400 mg by mouth daily.    . sildenafil (VIAGRA) 50 MG tablet Take 50 mg by mouth daily as needed for erectile dysfunction.    . vitamin C (ASCORBIC ACID) 500 MG tablet Take 1,000 mg by mouth daily.     Marland Kitchen XIIDRA 5 % SOLN Place 1 drop into both eyes 2 (two) times daily as needed for dry eyes.  No current facility-administered medications for this visit.     Allergies  Allergen Reactions  . Isosorbide Other (See Comments)     Dropped blood pressure , Develop severe headache   . Statins   . Adhesive [Tape] Rash and Other (See Comments)    Band aid adhesive ---- RASH Skin peels off  . Codeine Nausea And Vomiting  . Demerol [Meperidine] Nausea And Vomiting  . Lescol [Fluvastatin Sodium] Other (See Comments)    Muscle pain   . Morphine And Related Nausea And Vomiting  . Oxycodone Nausea And Vomiting  . Penicillins Nausea And Vomiting and Other (See Comments)    Has patient had a PCN reaction causing immediate rash, facial/tongue/throat swelling, SOB or lightheadedness with hypotension: No Has patient had a PCN reaction causing severe rash  involving mucus membranes or skin necrosis: No Has patient had a PCN reaction that required hospitalization No Has patient had a PCN reaction occurring within the last 10 years: No If all of the above answers are "NO", then may proceed with Cephalosporin use.   Christy Sartorius Sodium] Other (See Comments)    Muscle pain   . Welchol [Colesevelam Hcl] Other (See Comments)    Weakness   . Zetia [Ezetimibe] Other (See Comments)    Muscle pain     Past Medical History:  Diagnosis Date  . Arthritis   . Atherosclerosis of aorta ([place])   . CAD S/P percutaneous coronary angioplasty 2004, 2020   06-28-2002  Cardiac Cath subtotal TO of D1 -- staged PCI with Taxus DES 2.5 x 12; Cath (abn Nuc ST) 08/2011: 30% pLAD & pD1 with patent D1 stent.  EF ~45%.; Cath 11/17/2018: long ~50-60% m-dLAD, 70% pre & post stent in D1, Ostial lat OM1 80% -- failed Med Rx; LHC 12/17/18 with balloon and DES to LAD  . Diverticulosis   . GERD (gastroesophageal reflux disease)   . History of atrial fibrillation without current medication    one episode in 2011-- per cardiologist note has not had any other issues  . History of basal cell carcinoma (BCC) excision    x2  forehead  . History of kidney stones   . History of melanoma excision    scalp s/p moh's  . HTN (hypertension)   . Hypercholesterolemia   . Lung nodule    rt lower lobe  . Prostate cancer ([place])   . Renal cyst   . S/P drug eluting coronary stent placement 06/28/2002, 12/17/18   x1  to D1, 12/17/18 DES to LAD  . Varicose veins    wear teds  . Wears glasses   . Wears hearing aid in both ears     Blood pressure 120/80, pulse 76, resp. rate 15, height 5' 10.5" (1.791 m), weight 180 lb (81.6 kg), SpO2 98 %.   Essential hypertension Patient with known ASCVD unable to tolerate multiple statin drugs due to muscle aches and damage.  He was also unable to tolerate ezetimibe and colesevelam as well.  Will start paperwork to get patient approved for  Repatha [id number] mg SureClick.  Reviewed information on mechanism, side effects and expected results.  Patient will get labs drawn after 5-6 doses.  Of note, he is in the process of moving to the [place] area.  Advised that we will follow him with labs, then he should have new cardiologist take care of any future prior authorizations needed.     Tommy Medal PharmD CPP [place] [id number]  [address] [place], [place] [id number]

## 2019-01-25 ENCOUNTER — Telehealth: Payer: Self-pay

## 2019-01-25 NOTE — Telephone Encounter (Signed)
lmom pt regarding the approval of praluent 150, rx sent, instructed the pt to call back w/?'s

## 2019-02-02 DIAGNOSIS — M79641 Pain in right hand: Secondary | ICD-10-CM | POA: Diagnosis not present

## 2019-06-16 ENCOUNTER — Telehealth: Payer: Self-pay | Admitting: Cardiology

## 2019-06-16 NOTE — Telephone Encounter (Signed)
Spoke with patient about scheduling about from recall, he stated he has moved out of state to [place].  He is in the processing of getting a new cardiologist.

## 2020-01-28 IMAGING — RF DG ESOPHAGUS
5 series · 14 of 23 positions shown · non-contrast
Comparison: Chest CT 11/15/2013.  Upper GI series 03/26/2011.

CLINICAL DATA: Pharyngoesophageal dysphagia. History of esophageal
dilatation.

EXAM:
ESOPHOGRAM / BARIUM SWALLOW / BARIUM TABLET STUDY
TECHNIQUE: Combined double contrast and single contrast examination performed
using effervescent crystals, thick barium liquid, and thin barium
liquid. The patient was observed with fluoroscopy swallowing a 13 mm
barium sulphate tablet.
FLUOROSCOPY TIME:  Fluoroscopy Time: 1 minutes and 0 seconds of
low-dose pulsed fluoroscopy.
Radiation Exposure Index (if provided by the fluoroscopic device):
62.4 mGy
Number of Acquired Spot Images: 2 rapid sequence series of the
cervical esophagus.

[Series 1: one shot · 3 of 5 slices shown (1 of 2)]
[im 1/5]
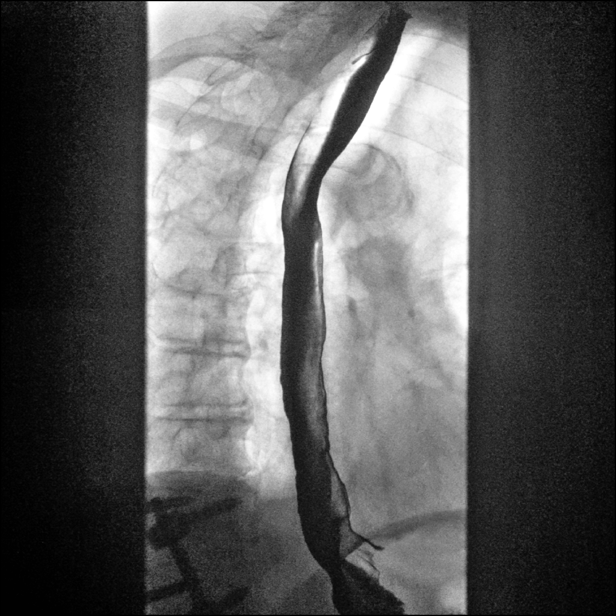
[im 3/5]
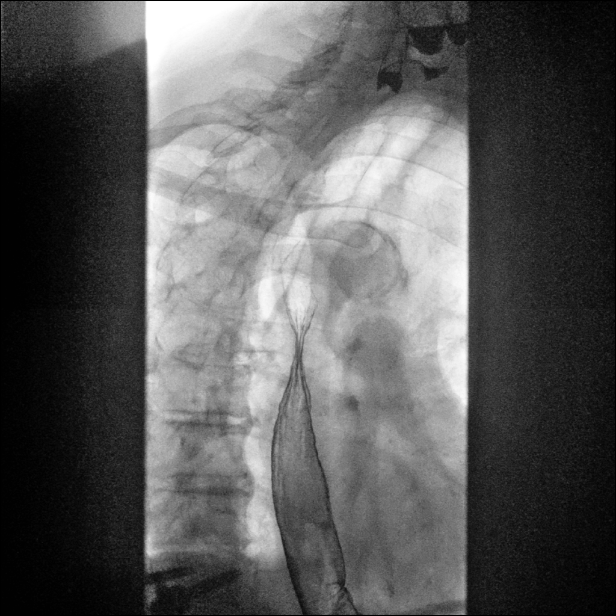
[im 5/5]
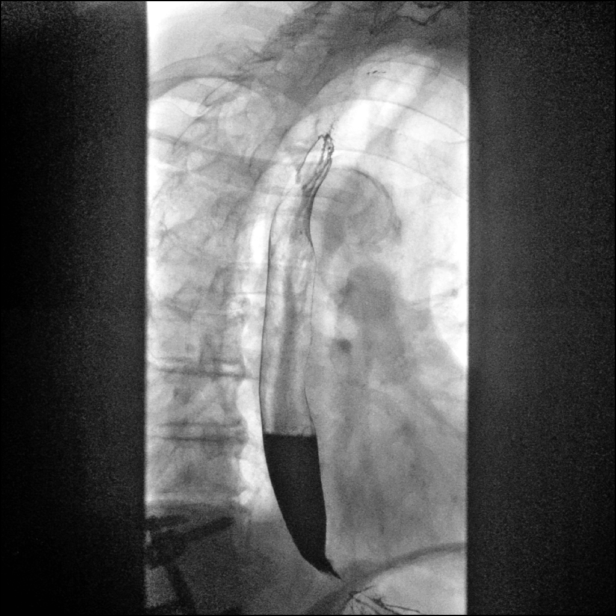

[Series 2: sequence · 0.31mm/px · 2 of 11 frames shown (1 of 3)]
[frame 2/11]
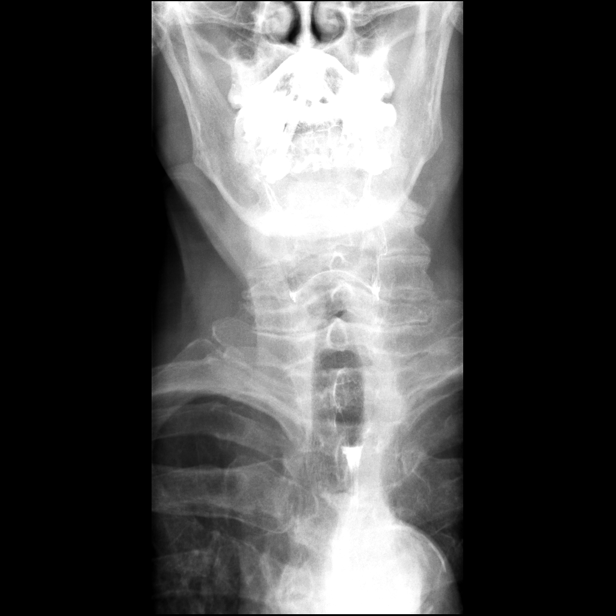
[frame 7/11]
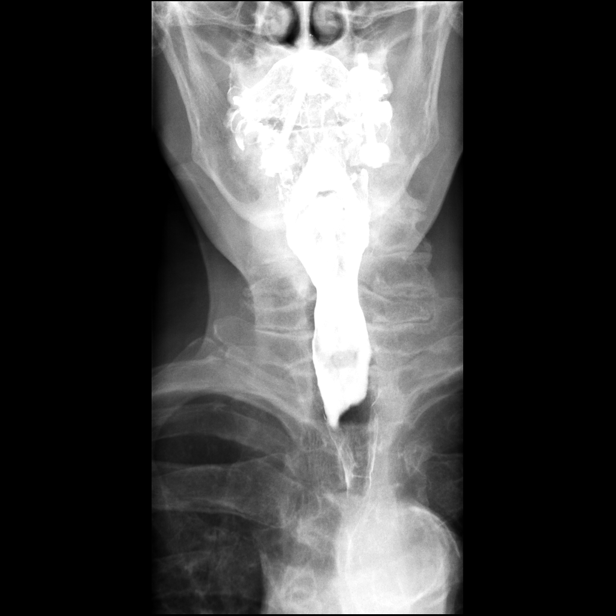

[Series 3: sequence · 0.31mm/px · 3 of 12 frames shown (2 of 3)]
[frame 2/12]
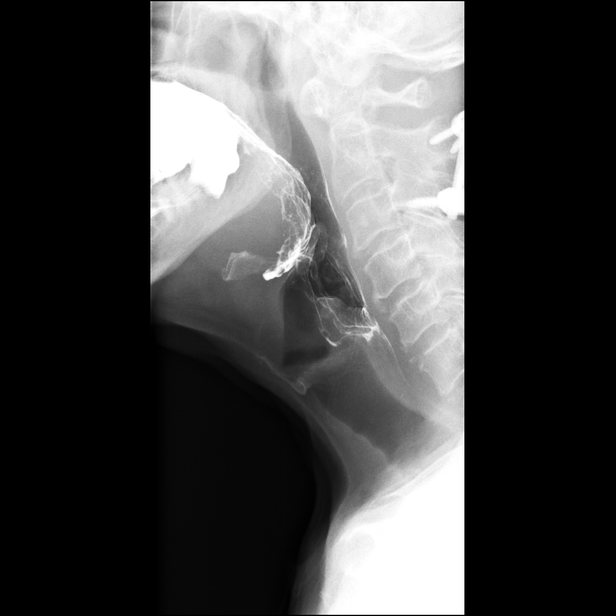
[frame 7/12]
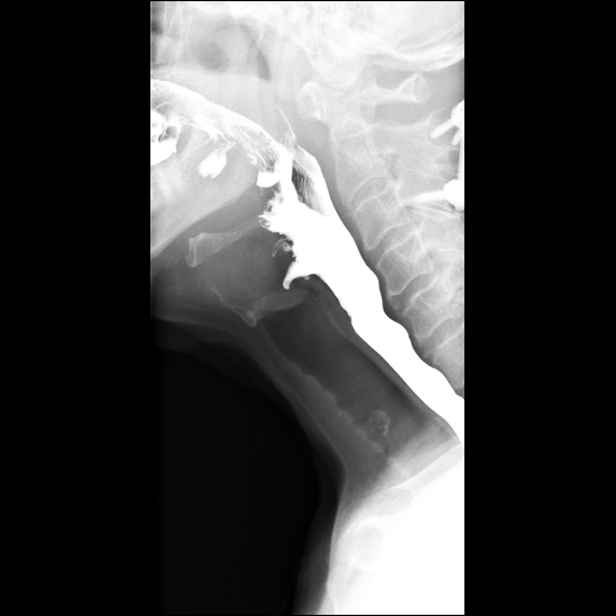
[frame 12/12]
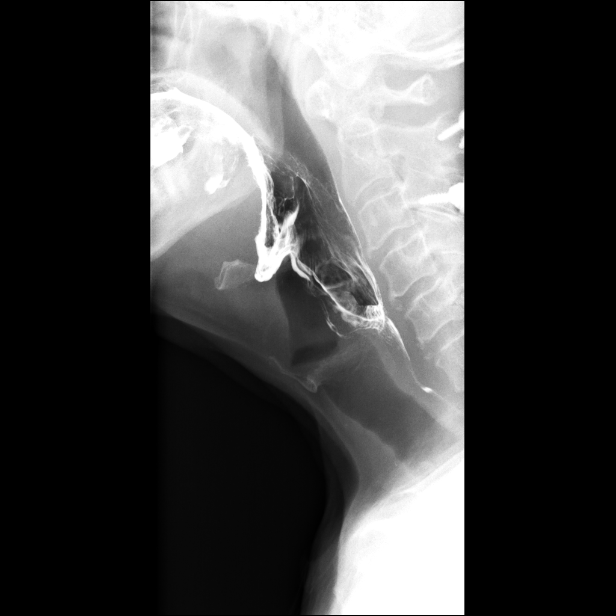

[Series 4: sequence · 2 of 20 frames shown (3 of 3)]
[frame 4/20]
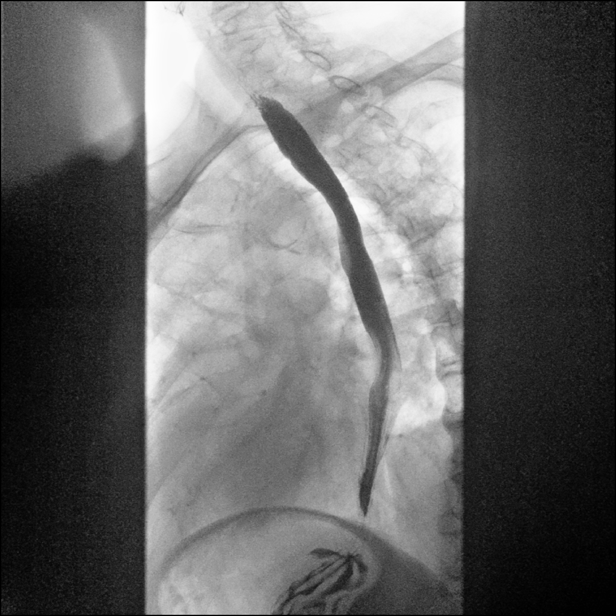
[frame 18/20]
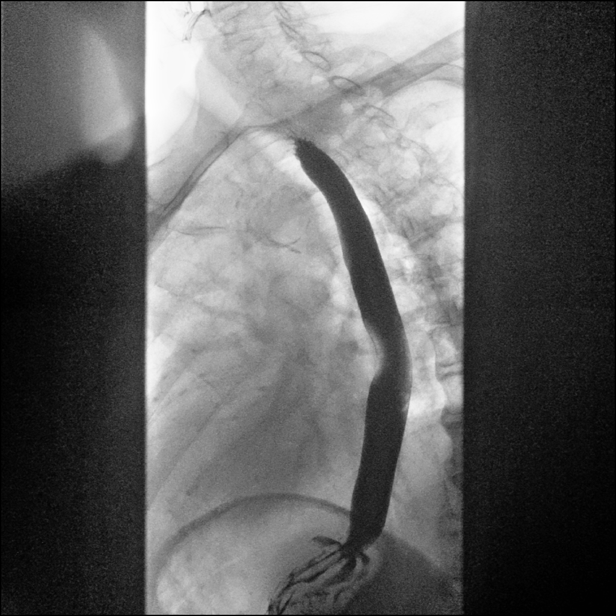

[Series 5: one shot · 4 of 7 slices shown (2 of 2)]
[im 2/7]
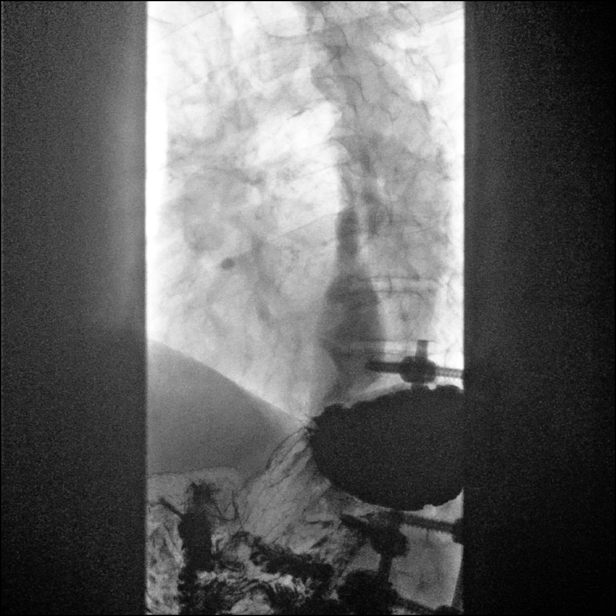
[im 3/7]
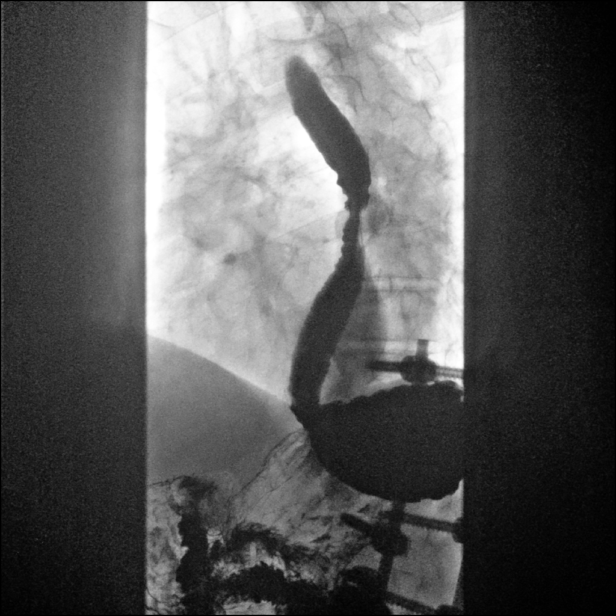
[im 5/7]
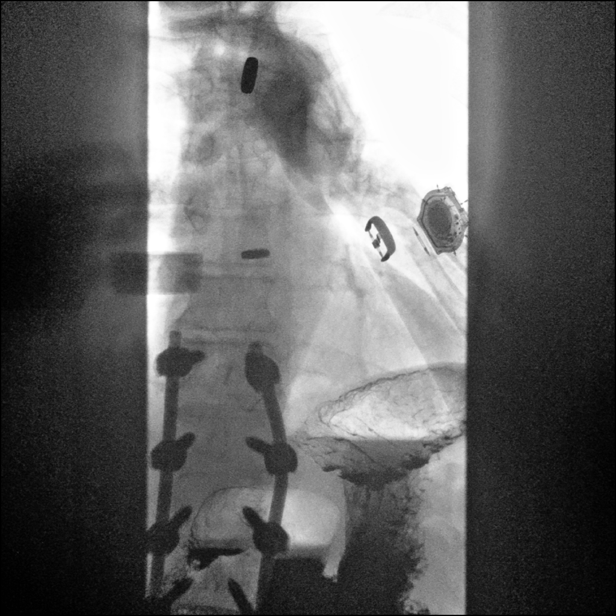
[im 7/7]
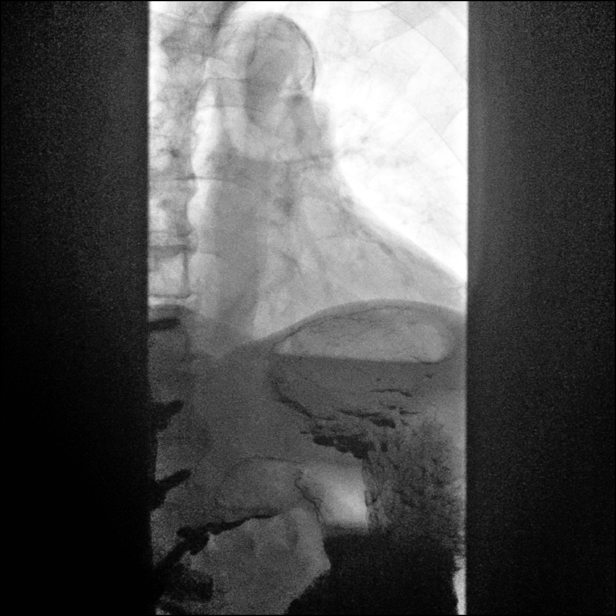

[14 of 23 positions shown; findings below may reference images not displayed]

FINDINGS: The esophageal motility is within normal limits. There is no
evidence of stricture, mass or ulceration. Rapid sequence imaging of
the pharynx in the AP and lateral projections demonstrates no
mucosal abnormalities. Patient is status post posterior fusion at
C2-3. Thoracolumbar fusion has also been performed.

There is no significant hiatal hernia, although moderate
gastroesophageal reflux to the level of the aortic arch was noted
with the water siphon test. This elicited mild distal esophageal
spasm. At the conclusion of the study, a 13 mm barium tablet was
administered. This passed without delay into the stomach.
IMPRESSION: 1. Moderate gastroesophageal reflux with associated distal
esophageal spasm.
2. No esophageal stricture, mass or ulceration.
